# Patient Record
Sex: Female | Born: 1960 | Race: White | Hispanic: No | Marital: Married | Smoking: Never smoker
Health system: Southern US, Community
[De-identification: ages and names within clinical notes are randomized; demographics above are authoritative.]

## PROBLEM LIST (undated history)

## (undated) DIAGNOSIS — Z973 Presence of spectacles and contact lenses: Secondary | ICD-10-CM

## (undated) DIAGNOSIS — Z87898 Personal history of other specified conditions: Secondary | ICD-10-CM

## (undated) DIAGNOSIS — N289 Disorder of kidney and ureter, unspecified: Secondary | ICD-10-CM

## (undated) DIAGNOSIS — R42 Dizziness and giddiness: Secondary | ICD-10-CM

## (undated) DIAGNOSIS — F325 Major depressive disorder, single episode, in full remission: Secondary | ICD-10-CM

## (undated) DIAGNOSIS — Z87442 Personal history of urinary calculi: Secondary | ICD-10-CM

## (undated) DIAGNOSIS — K219 Gastro-esophageal reflux disease without esophagitis: Secondary | ICD-10-CM

## (undated) HISTORY — PX: TUBAL LIGATION: SHX77

## (undated) HISTORY — DX: Major depressive disorder, single episode, in full remission: F32.5

## (undated) HISTORY — PX: RIGHT OOPHORECTOMY: SHX2359

---

## 2004-11-07 ENCOUNTER — Ambulatory Visit: Payer: Self-pay | Admitting: Family Medicine

## 2008-04-20 ENCOUNTER — Ambulatory Visit: Payer: Self-pay | Admitting: Obstetrics & Gynecology

## 2008-04-26 ENCOUNTER — Ambulatory Visit: Payer: Self-pay | Admitting: Obstetrics & Gynecology

## 2008-07-07 ENCOUNTER — Encounter: Payer: Self-pay | Admitting: Family Medicine

## 2008-08-01 ENCOUNTER — Encounter: Payer: Self-pay | Admitting: Family Medicine

## 2009-06-03 HISTORY — PX: COLONOSCOPY: SHX174

## 2009-06-04 LAB — HM COLONOSCOPY: HM Colonoscopy: NORMAL

## 2009-09-01 ENCOUNTER — Encounter: Payer: Self-pay | Admitting: Family Medicine

## 2009-10-01 ENCOUNTER — Encounter: Payer: Self-pay | Admitting: Family Medicine

## 2010-01-16 ENCOUNTER — Ambulatory Visit: Payer: Self-pay | Admitting: Family Medicine

## 2010-01-29 ENCOUNTER — Ambulatory Visit: Payer: Self-pay | Admitting: Obstetrics & Gynecology

## 2010-01-30 ENCOUNTER — Ambulatory Visit: Payer: Self-pay | Admitting: Obstetrics & Gynecology

## 2010-02-02 LAB — PATHOLOGY REPORT

## 2011-06-04 HISTORY — PX: COLONOSCOPY: SHX174

## 2011-09-18 ENCOUNTER — Ambulatory Visit: Payer: Self-pay | Admitting: Gastroenterology

## 2011-09-18 LAB — HM COLONOSCOPY

## 2013-06-15 LAB — HM PAP SMEAR: HM PAP: NORMAL

## 2013-08-01 LAB — HM MAMMOGRAPHY: HM Mammogram: NORMAL

## 2013-08-18 ENCOUNTER — Ambulatory Visit: Payer: Self-pay | Admitting: Internal Medicine

## 2014-11-08 ENCOUNTER — Encounter: Payer: Self-pay | Admitting: Internal Medicine

## 2014-11-09 ENCOUNTER — Encounter: Payer: Self-pay | Admitting: Internal Medicine

## 2014-11-09 DIAGNOSIS — K219 Gastro-esophageal reflux disease without esophagitis: Secondary | ICD-10-CM

## 2014-11-09 DIAGNOSIS — F325 Major depressive disorder, single episode, in full remission: Secondary | ICD-10-CM | POA: Insufficient documentation

## 2014-11-09 DIAGNOSIS — M1990 Unspecified osteoarthritis, unspecified site: Secondary | ICD-10-CM | POA: Insufficient documentation

## 2014-11-09 DIAGNOSIS — N3946 Mixed incontinence: Secondary | ICD-10-CM

## 2014-12-16 ENCOUNTER — Encounter: Payer: Self-pay | Admitting: Internal Medicine

## 2014-12-16 ENCOUNTER — Other Ambulatory Visit: Payer: Self-pay | Admitting: Internal Medicine

## 2014-12-16 DIAGNOSIS — N3946 Mixed incontinence: Secondary | ICD-10-CM | POA: Insufficient documentation

## 2014-12-16 DIAGNOSIS — F325 Major depressive disorder, single episode, in full remission: Secondary | ICD-10-CM

## 2014-12-16 DIAGNOSIS — M1990 Unspecified osteoarthritis, unspecified site: Secondary | ICD-10-CM | POA: Insufficient documentation

## 2014-12-16 DIAGNOSIS — K219 Gastro-esophageal reflux disease without esophagitis: Secondary | ICD-10-CM | POA: Insufficient documentation

## 2014-12-16 DIAGNOSIS — M62830 Muscle spasm of back: Secondary | ICD-10-CM | POA: Insufficient documentation

## 2014-12-16 HISTORY — DX: Major depressive disorder, single episode, in full remission: F32.5

## 2014-12-16 MED ORDER — OXYBUTYNIN CHLORIDE ER 10 MG PO TB24: 10.0000 mg | ORAL_TABLET | Freq: Every day | ORAL | Status: DC

## 2015-01-16 ENCOUNTER — Other Ambulatory Visit: Payer: Self-pay | Admitting: Internal Medicine

## 2015-05-23 ENCOUNTER — Other Ambulatory Visit: Payer: Self-pay

## 2015-05-23 MED ORDER — NAPROXEN SODIUM 550 MG PO TABS: 550.0000 mg | ORAL_TABLET | Freq: Two times a day (BID) | ORAL | Status: DC

## 2015-06-01 NOTE — Telephone Encounter (Signed)
Pts coming in on 3/9 for her cpe

## 2015-08-07 ENCOUNTER — Encounter: Payer: Self-pay | Admitting: Internal Medicine

## 2015-08-07 ENCOUNTER — Ambulatory Visit (INDEPENDENT_AMBULATORY_CARE_PROVIDER_SITE_OTHER): Payer: BLUE CROSS/BLUE SHIELD | Admitting: Internal Medicine

## 2015-08-07 VITALS — BP 110/72 | HR 88 | Ht 65.5 in | Wt 186.6 lb

## 2015-08-07 DIAGNOSIS — N3000 Acute cystitis without hematuria: Secondary | ICD-10-CM

## 2015-08-07 LAB — POC URINALYSIS WITH MICROSCOPIC (NON AUTO)MANUAL RESULT
BILIRUBIN UA: NEGATIVE
GLUCOSE UA: NEGATIVE
KETONES UA: NEGATIVE
NITRITE UA: NEGATIVE
PH UA: 7.5
Spec Grav, UA: <=1.005
UROBILINOGEN UA: 0.2

## 2015-08-07 MED ORDER — PHENAZOPYRIDINE HCL 97.2 MG PO TABS: 97.0000 mg | ORAL_TABLET | Freq: Three times a day (TID) | ORAL | Status: DC | PRN

## 2015-08-07 MED ORDER — CIPROFLOXACIN HCL 250 MG PO TABS
250.0000 mg | ORAL_TABLET | Freq: Two times a day (BID) | ORAL | Status: DC
Start: 2015-08-07 — End: 2015-10-03

## 2015-08-07 NOTE — Progress Notes (Signed)
    Date:  08/07/2015   Name:  Madeline Moon   DOB:  1961/04/22   MRN:  [id number]   Chief Complaint: Urinary Tract Infection Urinary Tract Infection  This is a new problem. The problem occurs every urination. The quality of the pain is described as burning. The pain is mild. There has been no fever. The fever has been present for 3 - 4 days. Associated symptoms include frequency and urgency. Pertinent negatives include no chills or hematuria. She has tried antibiotics (old flagyl) for the symptoms.    Review of Systems  Constitutional: Negative for fever and chills.  Respiratory: Negative for chest tightness and shortness of breath.   Cardiovascular: Negative for chest pain and palpitations.  Genitourinary: Positive for dysuria, urgency, frequency and difficulty urinating. Negative for hematuria, menstrual problem and pelvic pain.  Neurological: Negative for dizziness and headaches.  Hematological: Negative for adenopathy. Does not bruise/bleed easily.  Psychiatric/Behavioral: Negative for agitation.    Patient Active Problem List   Diagnosis Date Noted  . Depression, major, single episode, complete remission (HCC) 12/16/2014  . Back muscle spasm 12/16/2014  . GERD (gastroesophageal reflux disease) 11/09/2014  . Mixed urge and stress incontinence 11/09/2014  . Osteoarthritis 11/09/2014    Prior to Admission medications   Medication Sig Start Date End Date Taking? Authorizing Provider  naproxen sodium (ANAPROX) 550 MG tablet Take 1 tablet (550 mg total) by mouth 2 (two) times daily with a meal. 05/23/15  Yes Reubin MilanLaura H Kerri-Anne Haeberle, MD  omeprazole (PRILOSEC) 20 MG capsule TAKE 1 CAPSULE DAILY 01/16/15  Yes Reubin MilanLaura H Donnita Farina, MD    No Known Allergies  Past Surgical History  Procedure Laterality Date  . Right oophorectomy    . Tubal ligation      Social History  Substance Use Topics  . Smoking status: Never Smoker   . Smokeless tobacco: None  . Alcohol Use: 1.2 oz/week    2  Standard drinks or equivalent per week     Medication list has been reviewed and updated.   Physical Exam  Constitutional: She appears well-developed and well-nourished.  Cardiovascular: Normal rate, regular rhythm and normal heart sounds.   Pulmonary/Chest: Effort normal and breath sounds normal. No respiratory distress.  Abdominal: Soft. Bowel sounds are normal. There is tenderness in the suprapubic area. There is no rebound, no guarding and no CVA tenderness.  Psychiatric: She has a normal mood and affect.  Nursing note and vitals reviewed.   BP 110/72 mmHg  Pulse 88  Ht 5' 5.5" (1.664 m)  Wt 186 lb 9.6 oz (84.641 kg)  BMI 30.57 kg/m2  Assessment and Plan: 1. Acute cystitis without hematuria Continue fluids - POC urinalysis w microscopic (non auto) - ciprofloxacin (CIPRO) 250 MG tablet; Take 1 tablet (250 mg total) by mouth 2 (two) times daily.  Dispense: 14 tablet; Refill: 0 - phenazopyridine (PYRIDIUM) 97 MG tablet; Take 1 tablet (97 mg total) by mouth 3 (three) times daily as needed for pain.  Dispense: 9 tablet; Refill: 0   Bari EdwardLaura Roi Jafari, MD [facility]  08/07/2015

## 2015-08-07 NOTE — Patient Instructions (Signed)

## 2015-08-10 ENCOUNTER — Ambulatory Visit (INDEPENDENT_AMBULATORY_CARE_PROVIDER_SITE_OTHER): Payer: BLUE CROSS/BLUE SHIELD | Admitting: Internal Medicine

## 2015-08-10 ENCOUNTER — Encounter: Payer: Self-pay | Admitting: Internal Medicine

## 2015-08-10 VITALS — BP 110/72 | HR 80 | Ht 65.5 in | Wt 188.8 lb

## 2015-08-10 DIAGNOSIS — N3000 Acute cystitis without hematuria: Secondary | ICD-10-CM | POA: Diagnosis not present

## 2015-08-10 DIAGNOSIS — F325 Major depressive disorder, single episode, in full remission: Secondary | ICD-10-CM

## 2015-08-10 DIAGNOSIS — K219 Gastro-esophageal reflux disease without esophagitis: Secondary | ICD-10-CM

## 2015-08-10 DIAGNOSIS — Z23 Encounter for immunization: Secondary | ICD-10-CM

## 2015-08-10 DIAGNOSIS — Z1239 Encounter for other screening for malignant neoplasm of breast: Secondary | ICD-10-CM

## 2015-08-10 DIAGNOSIS — Z0000 Encounter for general adult medical examination without abnormal findings: Secondary | ICD-10-CM | POA: Diagnosis not present

## 2015-08-10 LAB — POCT URINALYSIS DIPSTICK
BILIRUBIN UA: NEGATIVE
Glucose, UA: NEGATIVE
Ketones, UA: NEGATIVE
Leukocytes, UA: NEGATIVE
NITRITE UA: NEGATIVE
PH UA: 7.5
Protein, UA: NEGATIVE
Spec Grav, UA: 1.01
UROBILINOGEN UA: 0.2

## 2015-08-10 NOTE — Patient Instructions (Addendum)
Breast Self-Awareness Practicing breast self-awareness may pick up problems early, prevent significant medical complications, and possibly save your life. By practicing breast self-awareness, you can become familiar with how your breasts look and feel and if your breasts are changing. This allows you to notice changes early. It can also offer you some reassurance that your breast health is good. One way to learn what is normal for your breasts and whether your breasts are changing is to do a breast self-exam. If you find a lump or something that was not present in the past, it is best to contact your caregiver right away. Other findings that should be evaluated by your caregiver include nipple discharge, especially if it is bloody; skin changes or reddening; areas where the skin seems to be pulled in (retracted); or new lumps and bumps. Breast pain is seldom associated with cancer (malignancy), but should also be evaluated by a caregiver. HOW TO PERFORM A BREAST SELF-EXAM The best time to examine your breasts is 5-7 days after your menstrual period is over. During menstruation, the breasts are lumpier, and it may be more difficult to pick up changes. If you do not menstruate, have reached menopause, or had your uterus removed (hysterectomy), you should examine your breasts at regular intervals, such as monthly. If you are breastfeeding, examine your breasts after a feeding or after using a breast pump. Breast implants do not decrease the risk for lumps or tumors, so continue to perform breast self-exams as recommended. Talk to your caregiver about how to determine the difference between the implant and breast tissue. Also, talk about the amount of pressure you should use during the exam. Over time, you will become more familiar with the variations of your breasts and more comfortable with the exam. A breast self-exam requires you to remove all your clothes above the waist.  Look at your breasts and nipples.  Stand in front of a mirror in a room with good lighting. With your hands on your hips, push your hands firmly downward. Look for a difference in shape, contour, and size from one breast to the other (asymmetry). Asymmetry includes puckers, dips, or bumps. Also, look for skin changes, such as reddened or scaly areas on the breasts. Look for nipple changes, such as discharge, dimpling, repositioning, or redness.  Carefully feel your breasts. This is best done either in the shower or tub while using soapy water or when flat on your back. Place the arm (on the side of the breast you are examining) above your head. Use the pads (not the fingertips) of your three middle fingers on your opposite hand to feel your breasts. Start in the underarm area and use  inch (2 cm) overlapping circles to feel your breast. Use 3 different levels of pressure (light, medium, and firm pressure) at each circle before moving to the next circle. The light pressure is needed to feel the tissue closest to the skin. The medium pressure will help to feel breast tissue a little deeper, while the firm pressure is needed to feel the tissue close to the ribs. Continue the overlapping circles, moving downward over the breast until you feel your ribs below your breast. Then, move one finger-width towards the center of the body. Continue to use the  inch (2 cm) overlapping circles to feel your breast as you move slowly up toward the collar bone (clavicle) near the base of the neck. Continue the up and down exam using all 3 pressures until you reach the   middle of the chest. Do this with each breast, carefully feeling for lumps or changes.   Keep a written record with breast changes or normal findings for each breast. By writing this information down, you do not need to depend only on memory for size, tenderness, or location. Write down where you are in your menstrual cycle, if you are still menstruating. Breast tissue can have some lumps or thick  tissue. However, see your caregiver if you find anything that concerns you.  SEEK MEDICAL CARE IF:  You see a change in shape, contour, or size of your breasts or nipples.   You see skin changes, such as reddened or scaly areas on the breasts or nipples.   You have an unusual discharge from your nipples.   You feel a new lump or unusually thick areas.    This information is not intended to replace advice given to you by your health care provider. Make sure you discuss any questions you have with your health care provider.   Document Released: 05/20/2005 Document Revised: 05/06/2012 Document Reviewed: 09/04/2011 Elsevier Interactive Patient Education 2016 Elsevier Inc[employer] Tdap Vaccine (Tetanus, Diphtheria and Pertussis): What You Need to Know 1. Why get vaccinated? Tetanus, diphtheria and pertussis are very serious diseases. Tdap vaccine can protect us from these diseases. And, Tdap vaccine given to pregnant women can protect newborn babies against pertussis. TETANUS (Lockjaw) is rare in the [place] today. It causes painful muscle tightening and stiffness, usually all over the body.  It can lead to tightening of muscles in the head and neck so you can't open your mouth, swallow, or sometimes even breathe. Tetanus kills about 1 out of 10 people who are infected even after receiving the best medical care. DIPHTHERIA is also rare in the [place] today. It can cause a thick coating to form in the back of the throat.  It can lead to breathing problems, heart failure, paralysis, and death. PERTUSSIS (Whooping Cough) causes severe coughing spells, which can cause difficulty breathing, vomiting and disturbed sleep.  It can also lead to weight loss, incontinence, and rib fractures. Up to 2 in 100 adolescents and 5 in 100 adults with pertussis are hospitalized or have complications, which could include pneumonia or death. These diseases are caused by bacteria. Diphtheria and pertussis  are spread from person to person through secretions from coughing or sneezing. Tetanus enters the body through cuts, scratches, or wounds. Before vaccines, as many as 200,000 cases of diphtheria, 200,000 cases of pertussis, and hundreds of cases of tetanus, were reported in the [place] each year. Since vaccination began, reports of cases for tetanus and diphtheria have dropped by about 99% and for pertussis by about 80%. 2. Tdap vaccine Tdap vaccine can protect adolescents and adults from tetanus, diphtheria, and pertussis. One dose of Tdap is routinely given at age 11 or 12. People who did not get Tdap at that age should get it as soon as possible. Tdap is especially important for healthcare professionals and anyone having close contact with a baby younger than 12 months. Pregnant women should get a dose of Tdap during every pregnancy, to protect the newborn from pertussis. Infants are most at risk for severe, life-threatening complications from pertussis. Another vaccine, called Td, protects against tetanus and diphtheria, but not pertussis. A Td booster should be given every 10 years. Tdap may be given as one of these boosters if you have never gotten Tdap before. Tdap may also be given after a severe   cut or burn to prevent tetanus infection. Your doctor or the person giving you the vaccine can give you more information. Tdap may safely be given at the same time as other vaccines. 3. Some people should not get this vaccine  A person who has ever had a life-threatening allergic reaction after a previous dose of any diphtheria, tetanus or pertussis containing vaccine, OR has a severe allergy to any part of this vaccine, should not get Tdap vaccine. Tell the person giving the vaccine about any severe allergies.  Anyone who had coma or long repeated seizures within 7 days after a childhood dose of DTP or DTaP, or a previous dose of Tdap, should not get Tdap, unless a cause other than the vaccine  was found. They can still get Td.  Talk to your doctor if you:  have seizures or another nervous system problem,  had severe pain or swelling after any vaccine containing diphtheria, tetanus or pertussis,  ever had a condition called Guillain-Barr Syndrome (GBS),  aren't feeling well on the day the shot is scheduled. 4. Risks With any medicine, including vaccines, there is a chance of side effects. These are usually mild and go away on their own. Serious reactions are also possible but are rare. Most people who get Tdap vaccine do not have any problems with it. Mild problems following Tdap (Did not interfere with activities)  Pain where the shot was given (about 3 in 4 adolescents or 2 in 3 adults)  Redness or swelling where the shot was given (about 1 person in 5)  Mild fever of at least 100.4F (up to about 1 in 25 adolescents or 1 in 100 adults)  Headache (about 3 or 4 people in 10)  Tiredness (about 1 person in 3 or 4)  Nausea, vomiting, diarrhea, stomach ache (up to 1 in 4 adolescents or 1 in 10 adults)  Chills, sore joints (about 1 person in 10)  Body aches (about 1 person in 3 or 4)  Rash, swollen glands (uncommon) Moderate problems following Tdap (Interfered with activities, but did not require medical attention)  Pain where the shot was given (up to 1 in 5 or 6)  Redness or swelling where the shot was given (up to about 1 in 16 adolescents or 1 in 12 adults)  Fever over 102F (about 1 in 100 adolescents or 1 in 250 adults)  Headache (about 1 in 7 adolescents or 1 in 10 adults)  Nausea, vomiting, diarrhea, stomach ache (up to 1 or 3 people in 100)  Swelling of the entire arm where the shot was given (up to about 1 in 500). Severe problems following Tdap (Unable to perform usual activities; required medical attention)  Swelling, severe pain, bleeding and redness in the arm where the shot was given (rare). Problems that could happen after any  vaccine:  People sometimes faint after a medical procedure, including vaccination. Sitting or lying down for about 15 minutes can help prevent fainting, and injuries caused by a fall. Tell your doctor if you feel dizzy, or have vision changes or ringing in the ears.  Some people get severe pain in the shoulder and have difficulty moving the arm where a shot was given. This happens very rarely.  Any medication can cause a severe allergic reaction. Such reactions from a vaccine are very rare, estimated at fewer than 1 in a million doses, and would happen within a few minutes to a few hours after the vaccination. As with any medicine,   there is a very remote chance of a vaccine causing a serious injury or death. The safety of vaccines is always being monitored. For more information, visit: www.cdc.gov/vaccinesafety/ 5. What if there is a serious problem? What should I look for?  Look for anything that concerns you, such as signs of a severe allergic reaction, very high fever, or unusual behavior.  Signs of a severe allergic reaction can include hives, swelling of the face and throat, difficulty breathing, a fast heartbeat, dizziness, and weakness. These would usually start a few minutes to a few hours after the vaccination. What should I do?  If you think it is a severe allergic reaction or other emergency that can't wait, call 9-1-1 or get the person to the nearest hospital. Otherwise, call your doctor.  Afterward, the reaction should be reported to the Vaccine Adverse Event Reporting System (VAERS). Your doctor might file this report, or you can do it yourself through the VAERS web site at www.vaers.hhs.gov, or by calling 1-800-822-7967. VAERS does not give medical advice.  6. The National Vaccine Injury Compensation Program The National Vaccine Injury Compensation Program (VICP) is a federal program that was created to compensate people who may have been injured by certain vaccines. Persons who  believe they may have been injured by a vaccine can learn about the program and about filing a claim by calling 1-800-338-2382 or visiting the VICP website at www.hrsa.gov/vaccinecompensation. There is a time limit to file a claim for compensation. 7. How can I learn more?  Ask your doctor. He or she can give you the vaccine package insert or suggest other sources of information.  Call your local or state health department.  Contact the Centers for Disease Control and Prevention (CDC):  Call 1-800-232-4636 (1-800-CDC-INFO) or  Visit CDC's website at www.cdc.gov/vaccines CDC Tdap Vaccine VIS (07/27/13)   This information is not intended to replace advice given to you by your health care provider. Make sure you discuss any questions you have with your health care provider.   Document Released: 11/19/2011 Document Revised: 06/10/2014 Document Reviewed: 09/01/2013 Elsevier Interactive Patient Education 2016 Elsevier Inc.  

## 2015-08-10 NOTE — Progress Notes (Signed)
Date:  08/10/2015   Name:  Madeline Moon   DOB:  24-Apr-1961   MRN:  [id number]   Chief Complaint: Annual Exam Madeline Moon is a 55 y.o. female who presents today for her Complete Annual Exam. She feels well. She reports exercising regularly. She reports she is sleeping well. She is starting a diet plan - based on a book.  She used it in the past and lost 20 lbs. She is due for a mammogram - only wants to do it every 2 years.  She does not perform self exams.  UTI - was seen earlier this week with UTI.  Started on Cipro and pyridium. She is feeling much better.  GERD - controlled on medication.  No trouble swallowing, heartburn or change in bowel habits.  Review of Systems  Constitutional: Negative for fever, chills and fatigue.  HENT: Negative for hearing loss, tinnitus, trouble swallowing and voice change.   Eyes: Negative for visual disturbance.  Respiratory: Negative for cough, chest tightness, shortness of breath and wheezing.   Cardiovascular: Negative for chest pain, palpitations and leg swelling.  Gastrointestinal: Negative for abdominal pain, diarrhea, constipation and blood in stool.  Genitourinary: Negative for dysuria, hematuria and difficulty urinating.  Musculoskeletal: Negative for arthralgias.  Skin: Negative for color change and rash.  Neurological: Negative for dizziness, light-headedness and headaches.  Hematological: Negative for adenopathy. Does not bruise/bleed easily.  Psychiatric/Behavioral: Negative for sleep disturbance and dysphoric mood.    Patient Active Problem List   Diagnosis Date Noted  . Depression, major, single episode, complete remission (HCC) 12/16/2014  . Back muscle spasm 12/16/2014  . GERD (gastroesophageal reflux disease) 11/09/2014  . Mixed urge and stress incontinence 11/09/2014  . Osteoarthritis 11/09/2014    Prior to Admission medications   Medication Sig Start Date End Date Taking? Authorizing Provider  ciprofloxacin  (CIPRO) 250 MG tablet Take 1 tablet (250 mg total) by mouth 2 (two) times daily. 08/07/15  Yes Reubin MilanLaura H Kveon Casanas, MD  naproxen sodium (ANAPROX) 550 MG tablet Take 1 tablet (550 mg total) by mouth 2 (two) times daily with a meal. 05/23/15  Yes Reubin MilanLaura H Robecca Fulgham, MD  omeprazole (PRILOSEC) 20 MG capsule TAKE 1 CAPSULE DAILY 01/16/15  Yes Reubin MilanLaura H Javeion Cannedy, MD  phenazopyridine (PYRIDIUM) 97 MG tablet Take 1 tablet (97 mg total) by mouth 3 (three) times daily as needed for pain. 08/07/15  Yes Reubin MilanLaura H Shaundrea Carrigg, MD    No Known Allergies  Past Surgical History  Procedure Laterality Date  . Right oophorectomy    . Tubal ligation      Social History  Substance Use Topics  . Smoking status: Never Smoker   . Smokeless tobacco: None  . Alcohol Use: 1.2 oz/week    2 Standard drinks or equivalent per week     Medication list has been reviewed and updated.   Physical Exam  Constitutional: She is oriented to person, place, and time. She appears well-developed and well-nourished. No distress.  HENT:  Head: Normocephalic and atraumatic.  Right Ear: Tympanic membrane and ear canal normal.  Left Ear: Tympanic membrane and ear canal normal.  Nose: Right sinus exhibits no maxillary sinus tenderness. Left sinus exhibits no maxillary sinus tenderness.  Mouth/Throat: Uvula is midline and oropharynx is clear and moist.  Eyes: Conjunctivae and EOM are normal. Right eye exhibits no discharge. Left eye exhibits no discharge. No scleral icterus.  Neck: Normal range of motion. Carotid bruit is not present. No erythema present. No  thyromegaly present.  Cardiovascular: Normal rate, regular rhythm, normal heart sounds and normal pulses.   Pulmonary/Chest: Effort normal. No respiratory distress. She has no wheezes. Right breast exhibits no mass, no nipple discharge, no skin change and no tenderness. Left breast exhibits no mass, no nipple discharge, no skin change and no tenderness.  Abdominal: Soft. Bowel sounds are  normal. There is no hepatosplenomegaly. There is no tenderness. There is no CVA tenderness.  Musculoskeletal: Normal range of motion.  Lymphadenopathy:    She has no cervical adenopathy.    She has no axillary adenopathy.  Neurological: She is alert and oriented to person, place, and time. She has normal reflexes. No cranial nerve deficit or sensory deficit.  Skin: Skin is warm, dry and intact. No rash noted.  Psychiatric: She has a normal mood and affect. Her speech is normal and behavior is normal. Thought content normal.  Nursing note and vitals reviewed.   BP 110/72 mmHg  Pulse 80  Ht 5' 5.5" (1.664 m)  Wt 188 lb 12.8 oz (85.639 kg)  BMI 30.93 kg/m2  Assessment and Plan: 1. Annual physical exam Normal exam except for weight - Comprehensive metabolic panel - Lipid panel - TSH  2. Gastroesophageal reflux disease, esophagitis presence not specified controlled - CBC with Differential/Platelet  3. Depression, major, single episode, complete remission (HCC) Doing well off of medication  4. Acute cystitis without hematuria improved - POCT urinalysis dipstick  5. Breast cancer screening - MM DIGITAL SCREENING BILATERAL; Future  6. Need for diphtheria-tetanus-pertussis (Tdap) vaccine - Tdap vaccine greater than or equal to 7yo IM  Bari Edward, MD [facility]  08/10/2015

## 2015-08-12 LAB — COMPREHENSIVE METABOLIC PANEL
A/G RATIO: 1.6 (ref 1.1–2.5)
ALBUMIN: 4.1 g/dL (ref 3.5–5.5)
ALT: 19 IU/L (ref 0–32)
AST: 18 IU/L (ref 0–40)
Alkaline Phosphatase: 98 IU/L (ref 39–117)
BILIRUBIN TOTAL: 0.6 mg/dL (ref 0.0–1.2)
BUN/Creatinine Ratio: 18 (ref 9–23)
BUN: 13 mg/dL (ref 6–24)
CALCIUM: 9.2 mg/dL (ref 8.7–10.2)
CHLORIDE: 102 mmol/L (ref 96–106)
CO2: 24 mmol/L (ref 18–29)
Creatinine, Ser: 0.72 mg/dL (ref 0.57–1.00)
GFR calc non Af Amer: 95 mL/min/{1.73_m2} (ref >59)
GFR, EST AFRICAN AMERICAN: 109 mL/min/{1.73_m2} (ref >59)
GLOBULIN, TOTAL: 2.5 g/dL (ref 1.5–4.5)
Glucose: 104 mg/dL — ABNORMAL HIGH (ref 65–99)
POTASSIUM: 5 mmol/L (ref 3.5–5.2)
Sodium: 141 mmol/L (ref 134–144)
Total Protein: 6.6 g/dL (ref 6.0–8.5)

## 2015-08-12 LAB — CBC WITH DIFFERENTIAL/PLATELET
BASOS: 1 %
Basophils Absolute: 0 10*3/uL (ref 0.0–0.2)
EOS (ABSOLUTE): 0.2 10*3/uL (ref 0.0–0.4)
EOS: 5 %
HEMATOCRIT: 44.5 % (ref 34.0–46.6)
HEMOGLOBIN: 14 g/dL (ref 11.1–15.9)
Immature Grans (Abs): 0 10*3/uL (ref 0.0–0.1)
Immature Granulocytes: 0 %
LYMPHS ABS: 1.4 10*3/uL (ref 0.7–3.1)
Lymphs: 32 %
MCH: 27.4 pg (ref 26.6–33.0)
MCHC: 31.5 g/dL (ref 31.5–35.7)
MCV: 87 fL (ref 79–97)
MONOCYTES: 8 %
Monocytes Absolute: 0.3 10*3/uL (ref 0.1–0.9)
Neutrophils Absolute: 2.4 10*3/uL (ref 1.4–7.0)
Neutrophils: 54 %
Platelets: 265 10*3/uL (ref 150–379)
RBC: 5.11 x10E6/uL (ref 3.77–5.28)
RDW: 15.6 % — ABNORMAL HIGH (ref 12.3–15.4)
WBC: 4.4 10*3/uL (ref 3.4–10.8)

## 2015-08-12 LAB — HEMOGLOBIN A1C
ESTIMATED AVERAGE GLUCOSE: 126 mg/dL
HEMOGLOBIN A1C: 6 % — AB (ref 4.8–5.6)

## 2015-08-12 LAB — LIPID PANEL
Chol/HDL Ratio: 4.3 ratio units (ref 0.0–4.4)
Cholesterol, Total: 237 mg/dL — ABNORMAL HIGH (ref 100–199)
HDL: 55 mg/dL (ref >39)
LDL Calculated: 157 mg/dL — ABNORMAL HIGH (ref 0–99)
Triglycerides: 127 mg/dL (ref 0–149)
VLDL Cholesterol Cal: 25 mg/dL (ref 5–40)

## 2015-08-12 LAB — TSH: TSH: 1.51 u[IU]/mL (ref 0.450–4.500)

## 2015-08-14 ENCOUNTER — Telehealth: Payer: Self-pay

## 2015-08-14 NOTE — Telephone Encounter (Signed)
-----   Message from Reubin MilanLaura H Berglund, MD sent at 08/12/2015  3:50 PM EST ----- Labs are all normal.  A1C is 6 - pre-diabetic range but should improve with weight loss.

## 2015-08-14 NOTE — Telephone Encounter (Signed)
Spoke with patient. Patient advised of all results and verbalized understanding. Will call back with any future questions or concerns. MAH  

## 2015-08-14 NOTE — Telephone Encounter (Signed)
-----   Message from Laura H Berglund, MD sent at 08/12/2015  3:50 PM EST ----- Labs are all normal.  A1C is 6 - pre-diabetic range but should improve with weight loss. 

## 2015-10-03 ENCOUNTER — Ambulatory Visit
Admission: RE | Admit: 2015-10-03 | Discharge: 2015-10-03 | Disposition: A | Discharge status: Home / Self Care | Payer: BLUE CROSS/BLUE SHIELD | Source: Ambulatory Visit | Attending: Internal Medicine | Admitting: Internal Medicine

## 2015-10-03 ENCOUNTER — Encounter: Payer: Self-pay | Admitting: Internal Medicine

## 2015-10-03 ENCOUNTER — Ambulatory Visit (INDEPENDENT_AMBULATORY_CARE_PROVIDER_SITE_OTHER): Payer: BLUE CROSS/BLUE SHIELD | Admitting: Internal Medicine

## 2015-10-03 VITALS — BP 115/78 | HR 71 | Temp 98.6°F | Resp 16 | Ht 65.5 in | Wt 195.0 lb

## 2015-10-03 DIAGNOSIS — R0781 Pleurodynia: Secondary | ICD-10-CM | POA: Diagnosis not present

## 2015-10-03 NOTE — Progress Notes (Signed)
    Date:  10/03/2015   Name:  Madeline MourningJudith A Moon   DOB:  [birth year]   MRN:  [id number]   Chief Complaint: Chest Pain Chest Pain  This is a new problem. The current episode started more than 1 month ago. The problem occurs intermittently. The pain is present in the lateral region. The quality of the pain is described as sharp. The pain does not radiate. Pertinent negatives include no abdominal pain, cough, fever, palpitations or shortness of breath. She has tried nothing for the symptoms.  She lived with smokers her whole life.  She has had several bouts of bronchitis as an adult.  At one time she was using an inhaler for asthma.  She also had LPR causing some symptoms.  These improved with improved diet and she stopped the PPI.  However, gerd is recurring more due to liberalizing her diet.   Review of Systems  Constitutional: Negative for fever, chills and fatigue.  HENT: Negative for congestion.   Respiratory: Negative for cough, chest tightness, shortness of breath and wheezing.   Cardiovascular: Positive for chest pain. Negative for palpitations and leg swelling.  Gastrointestinal: Negative for abdominal pain.    Patient Active Problem List   Diagnosis Date Noted  . Depression, major, single episode, complete remission (HCC) 12/16/2014  . Back muscle spasm 12/16/2014  . GERD (gastroesophageal reflux disease) 11/09/2014  . Mixed urge and stress incontinence 11/09/2014  . Osteoarthritis 11/09/2014    Prior to Admission medications   Medication Sig Start Date End Date Taking? Authorizing Provider  omeprazole (PRILOSEC) 20 MG capsule TAKE 1 CAPSULE DAILY 01/16/15  Yes Reubin MilanLaura H Leshon Armistead, MD    No Known Allergies  Past Surgical History  Procedure Laterality Date  . Right oophorectomy    . Tubal ligation      Social History  Substance Use Topics  . Smoking status: Never Smoker   . Smokeless tobacco: None  . Alcohol Use: 1.2 oz/week    2 Standard drinks or equivalent per week      Medication list has been reviewed and updated.   Physical Exam  Constitutional: She is oriented to person, place, and time. She appears well-developed. No distress.  HENT:  Head: Normocephalic and atraumatic.  Neck: Normal range of motion. No thyromegaly present.  Cardiovascular: Normal rate, regular rhythm and normal heart sounds.   Pulmonary/Chest: Effort normal and breath sounds normal. No respiratory distress. She has no wheezes. She has no rales. She exhibits no tenderness.  Musculoskeletal: Normal range of motion.  Neurological: She is alert and oriented to person, place, and time.  Skin: Skin is warm and dry. No rash noted.  Psychiatric: She has a normal mood and affect. Her behavior is normal. Thought content normal.  Nursing note and vitals reviewed.   BP 115/78 mmHg  Pulse 71  Temp(Src) 98.6 F (37 C) (Oral)  Resp 16  Ht 5' 5.5" (1.664 m)  Wt 195 lb (88.451 kg)  BMI 31.94 kg/m2  SpO2 100%  Assessment and Plan: 1. Pleuritic chest pain Resume daily PPI Begin asthma therapy with Advair or similar Follow up with CXR results - DG Chest 2 View; Future   Bari EdwardLaura Kern Gingras, MD [facility]  10/03/2015

## 2016-04-05 ENCOUNTER — Encounter: Payer: Self-pay | Admitting: Internal Medicine

## 2016-04-05 ENCOUNTER — Ambulatory Visit (INDEPENDENT_AMBULATORY_CARE_PROVIDER_SITE_OTHER): Payer: BLUE CROSS/BLUE SHIELD | Admitting: Internal Medicine

## 2016-04-05 VITALS — BP 108/78 | HR 73 | Resp 16 | Ht 65.5 in | Wt 197.0 lb

## 2016-04-05 DIAGNOSIS — B80 Enterobiasis: Secondary | ICD-10-CM | POA: Diagnosis not present

## 2016-04-05 DIAGNOSIS — Z23 Encounter for immunization: Secondary | ICD-10-CM

## 2016-04-05 MED ORDER — ALBENDAZOLE 200 MG PO TABS: 400.0000 mg | ORAL_TABLET | Freq: Once | ORAL | 0 refills | Status: DC

## 2016-04-05 NOTE — Progress Notes (Signed)
    Date:  04/05/2016   Name:  Madeline Moon   DOB:  [birth year]   MRN:  [id number]   Chief Complaint: Anal Itching (Night time irritation in middle of night 3 weeks now and tried Reeses Pinworm OTC but did not work. ) She has itching/tickling at night mostly.  Not severe.  No bleeding or diarrhea.  No weight loss. She tried to use gorilla tape to catch some pinworms but was not successful.  She has seen some dark 'grit' in her stool thinking these were parasite eggs. She has taken one dose of over the counter pinworm treatment.  Review of Systems  Constitutional: Negative for chills, fever and unexpected weight change.  Respiratory: Negative for chest tightness and shortness of breath.   Cardiovascular: Negative for chest pain.  Gastrointestinal: Positive for rectal pain (itching). Negative for abdominal pain, blood in stool, constipation and diarrhea.    Patient Active Problem List   Diagnosis Date Noted  . Depression, major, single episode, complete remission (HCC) 12/16/2014  . Back muscle spasm 12/16/2014  . GERD (gastroesophageal reflux disease) 11/09/2014  . Mixed urge and stress incontinence 11/09/2014  . Osteoarthritis 11/09/2014    Prior to Admission medications   Medication Sig Start Date End Date Taking? Authorizing Provider  omeprazole (PRILOSEC) 20 MG capsule TAKE 1 CAPSULE DAILY 01/16/15  Yes Reubin MilanLaura H Mikaila Grunert, MD  Pyrantel Pamoate (REESES PINWORM MEDICINE PO) Take by mouth.   Yes Historical Provider, MD    No Known Allergies  Past Surgical History:  Procedure Laterality Date  . RIGHT OOPHORECTOMY    . TUBAL LIGATION      Social History  Substance Use Topics  . Smoking status: Never Smoker  . Smokeless tobacco: Never Used  . Alcohol use 1.2 oz/week    2 Standard drinks or equivalent per week     Medication list has been reviewed and updated.   Physical Exam  Constitutional: She is oriented to person, place, and time. She appears well-developed. No  distress.  HENT:  Head: Normocephalic and atraumatic.  Cardiovascular: Normal rate, regular rhythm and normal heart sounds.   Pulmonary/Chest: Effort normal and breath sounds normal. No respiratory distress.  Musculoskeletal: Normal range of motion.  Neurological: She is alert and oriented to person, place, and time.  Skin: Skin is warm and dry. No rash noted.  Psychiatric: She has a normal mood and affect. Her behavior is normal. Thought content normal.  Nursing note and vitals reviewed.   BP 108/78   Pulse 73   Resp 16   Ht 5' 5.5" (1.664 m)   Wt 197 lb (89.4 kg)   SpO2 99%   BMI 32.28 kg/m   Assessment and Plan: 1. 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 Repeat in one week  Dispense: 4 tablet; Refill: 0  2. Need for influenza vaccination - Flu Vaccine QUAD 36+ mos IM   Bari EdwardLaura Aleria Maheu, MD [facility]  04/05/2016

## 2016-05-03 ENCOUNTER — Other Ambulatory Visit: Payer: Self-pay | Admitting: Internal Medicine

## 2016-05-03 DIAGNOSIS — B80 Enterobiasis: Secondary | ICD-10-CM

## 2016-05-03 MED ORDER — ALBENDAZOLE 200 MG PO TABS: 400.0000 mg | ORAL_TABLET | Freq: Once | ORAL | 0 refills | Status: AC

## 2016-05-07 ENCOUNTER — Telehealth: Payer: Self-pay

## 2016-05-07 NOTE — Telephone Encounter (Signed)
Called for refill on pin worm meds and advised her to bring us sample per Dr. Judithann GravesBerglund.

## 2016-05-14 ENCOUNTER — Encounter: Payer: Self-pay | Admitting: Internal Medicine

## 2016-05-14 ENCOUNTER — Ambulatory Visit (INDEPENDENT_AMBULATORY_CARE_PROVIDER_SITE_OTHER): Payer: BLUE CROSS/BLUE SHIELD | Admitting: Internal Medicine

## 2016-05-14 VITALS — BP 122/86 | HR 67 | Temp 97.6°F | Wt 197.0 lb

## 2016-05-14 DIAGNOSIS — L290 Pruritus ani: Secondary | ICD-10-CM

## 2016-05-14 MED ORDER — HYDROCORTISONE 2.5 % RE CREA: 1.0000 "application " | TOPICAL_CREAM | Freq: Three times a day (TID) | RECTAL | 0 refills | Status: DC

## 2016-05-14 NOTE — Progress Notes (Signed)
    Date:  05/14/2016   Name:  Madeline MourningJudith A Pepitone   DOB:  1960-07-20   MRN:  [id number]   Chief Complaint: No chief complaint on file. Patient seen recently with complaint of pin worms - rectal itching at night and during the day.  She did not see any thing but wanted empiric treatment.  She was given one round of albenazole which she said did not work.  She was given a second single dose.  She was instructed to return if sx were persistent. She still has itching at random times and has tried to use tape but has not captured a worm.  I declined to give her more medication without a definitive dx.     Review of Systems  Constitutional: Negative for appetite change, fatigue and fever.  Respiratory: Negative for cough, chest tightness and shortness of breath.   Cardiovascular: Negative for chest pain, palpitations and leg swelling.  Gastrointestinal: Negative for abdominal pain, blood in stool, diarrhea and rectal pain.       Rectal itching   Genitourinary: Negative for dysuria and hematuria.  Musculoskeletal: Negative for arthralgias.  Skin: Negative for rash.  Neurological: Negative for tremors, numbness and headaches.  Psychiatric/Behavioral: Negative for dysphoric mood.    Patient Active Problem List   Diagnosis Date Noted  . Depression, major, single episode, complete remission (HCC) 12/16/2014  . Back muscle spasm 12/16/2014  . GERD (gastroesophageal reflux disease) 11/09/2014  . Mixed urge and stress incontinence 11/09/2014  . Osteoarthritis 11/09/2014    Prior to Admission medications   Medication Sig Start Date End Date Taking? Authorizing Provider  omeprazole (PRILOSEC) 20 MG capsule TAKE 1 CAPSULE DAILY 01/16/15  Yes Reubin MilanLaura H Onnika Siebel, MD    No Known Allergies  Past Surgical History:  Procedure Laterality Date  . RIGHT OOPHORECTOMY    . TUBAL LIGATION      Social History  Substance Use Topics  . Smoking status: Never Smoker  . Smokeless tobacco: Never Used  .  Alcohol use 1.2 oz/week    2 Standard drinks or equivalent per week     Medication list has been reviewed and updated.   Physical Exam  Constitutional: She is oriented to person, place, and time. She appears well-developed. No distress.  HENT:  Head: Normocephalic and atraumatic.  Cardiovascular: Normal rate, regular rhythm and normal heart sounds.   Pulmonary/Chest: Effort normal and breath sounds normal. No respiratory distress.  Abdominal:  Patient declines rectal exam  Musculoskeletal: Normal range of motion.  Neurological: She is alert and oriented to person, place, and time.  Skin: Skin is warm and dry. No rash noted.  Psychiatric: She has a normal mood and affect. Her behavior is normal. Thought content normal.  Nursing note and vitals reviewed.   BP 122/86   Pulse 67   Temp 97.6 F (36.4 C)   Wt 197 lb (89.4 kg)   SpO2 99%   BMI 32.28 kg/m   Assessment and Plan: 1. Rectal itching Unlikely to be worms Return if no improvement with anusol - hydrocortisone (ANUSOL-HC) 2.5 % rectal cream; Place 1 application rectally 3 (three) times daily.  Dispense: 30 g; Refill: 0   Bari EdwardLaura Keaja Reaume, MD [facility]  05/14/2016

## 2016-05-23 ENCOUNTER — Other Ambulatory Visit: Payer: BLUE CROSS/BLUE SHIELD

## 2016-05-23 ENCOUNTER — Ambulatory Visit (INDEPENDENT_AMBULATORY_CARE_PROVIDER_SITE_OTHER): Payer: BLUE CROSS/BLUE SHIELD | Admitting: Internal Medicine

## 2016-05-23 ENCOUNTER — Encounter: Payer: Self-pay | Admitting: Internal Medicine

## 2016-05-23 VITALS — BP 132/88 | HR 86 | Temp 98.2°F | Wt 198.0 lb

## 2016-05-23 DIAGNOSIS — B354 Tinea corporis: Secondary | ICD-10-CM

## 2016-05-23 MED ORDER — FLUCONAZOLE 100 MG PO TABS: 100.0000 mg | ORAL_TABLET | Freq: Every day | ORAL | 0 refills | Status: DC

## 2016-05-23 NOTE — Progress Notes (Signed)
Date:  05/23/2016   Name:  Madeline MourningJudith A Hurd   DOB:  [birth year]   MRN:  [id number]   Chief Complaint: No chief complaint on file. HPI Rectal itching - patient was seen initially several weeks ago complaining of rectal itching which she thought was coming from pinworms. The itching was primarily nocturnal however the patient was unable to actually obtain any worms. She was treated with 2 courses of anti-helminthic medication with only transient improvement. She was then seen with what was felt to be possibly hemorrhoids but a rectal exam was not performed per patient request. She has used hydrocortisone rectally for several days with transient improvement but now is itching again.   Review of Systems  Constitutional: Negative for chills and fever.  Respiratory: Negative for chest tightness and shortness of breath.   Cardiovascular: Negative for chest pain.  Gastrointestinal: Negative for blood in stool, constipation and rectal pain.       Rectal itching  Genitourinary: Negative for dysuria and hematuria.    Patient Active Problem List   Diagnosis Date Noted  . Depression, major, single episode, complete remission (HCC) 12/16/2014  . Back muscle spasm 12/16/2014  . GERD (gastroesophageal reflux disease) 11/09/2014  . Mixed urge and stress incontinence 11/09/2014  . Osteoarthritis 11/09/2014    Prior to Admission medications   Medication Sig Start Date End Date Taking? Authorizing Provider  fluconazole (DIFLUCAN) 100 MG tablet Take 1 tablet (100 mg total) by mouth daily. 05/23/16   Reubin MilanLaura H Holston Oyama, MD  hydrocortisone (ANUSOL-HC) 2.5 % rectal cream Place 1 application rectally 3 (three) times daily. 05/14/16   Reubin MilanLaura H Drucilla Cumber, MD  omeprazole (PRILOSEC) 20 MG capsule TAKE 1 CAPSULE DAILY 01/16/15   Reubin MilanLaura H Marne Meline, MD    No Known Allergies  Past Surgical History:  Procedure Laterality Date  . RIGHT OOPHORECTOMY    . TUBAL LIGATION      Social History  Substance Use  Topics  . Smoking status: Never Smoker  . Smokeless tobacco: Never Used  . Alcohol use 1.2 oz/week    2 Standard drinks or equivalent per week     Medication list has been reviewed and updated.   Physical Exam  Constitutional: She is oriented to person, place, and time. She appears well-developed. No distress.  HENT:  Head: Normocephalic and atraumatic.  Neck: Normal range of motion. Neck supple. No thyromegaly present.  Cardiovascular: Normal rate, regular rhythm and normal heart sounds.   Pulmonary/Chest: Effort normal and breath sounds normal. No respiratory distress.  Abdominal: Soft. Bowel sounds are normal.  Genitourinary: Rectal exam shows no external hemorrhoid (small tag at 12 oclock), no internal hemorrhoid, no mass and no tenderness.  Musculoskeletal: Normal range of motion.  Neurological: She is alert and oriented to person, place, and time.  Skin: Skin is warm and dry. No rash noted.  Skin around the rectum is slightly reddened c/w tinea  Psychiatric: She has a normal mood and affect. Her behavior is normal. Thought content normal.  Nursing note and vitals reviewed.   BP 132/88   Pulse 86   Temp 98.2 F (36.8 C)   Wt 198 lb (89.8 kg)   SpO2 97%   BMI 32.45 kg/m   Assessment and Plan: 1. Tinea corporis Stop anusol HC cream Avoid excessive moisture and heat - fluconazole (DIFLUCAN) 100 MG tablet; Take 1 tablet (100 mg total) by mouth daily.  Dispense: 3 tablet; Refill: 0   Bari EdwardLaura Keaunna Skipper, MD [facility]  05/23/2016

## 2016-08-12 ENCOUNTER — Encounter: Payer: Self-pay | Admitting: Internal Medicine

## 2016-08-12 ENCOUNTER — Ambulatory Visit (INDEPENDENT_AMBULATORY_CARE_PROVIDER_SITE_OTHER): Payer: BLUE CROSS/BLUE SHIELD | Admitting: Internal Medicine

## 2016-08-12 VITALS — BP 106/82 | HR 62 | Ht 65.0 in | Wt 192.6 lb

## 2016-08-12 DIAGNOSIS — K219 Gastro-esophageal reflux disease without esophagitis: Secondary | ICD-10-CM | POA: Diagnosis not present

## 2016-08-12 DIAGNOSIS — Z1231 Encounter for screening mammogram for malignant neoplasm of breast: Secondary | ICD-10-CM

## 2016-08-12 DIAGNOSIS — Z0000 Encounter for general adult medical examination without abnormal findings: Secondary | ICD-10-CM | POA: Diagnosis not present

## 2016-08-12 DIAGNOSIS — Z1239 Encounter for other screening for malignant neoplasm of breast: Secondary | ICD-10-CM

## 2016-08-12 DIAGNOSIS — E785 Hyperlipidemia, unspecified: Secondary | ICD-10-CM | POA: Insufficient documentation

## 2016-08-12 LAB — POCT URINALYSIS DIPSTICK
Bilirubin, UA: NEGATIVE
Glucose, UA: NEGATIVE
Ketones, UA: NEGATIVE
Leukocytes, UA: NEGATIVE
NITRITE UA: NEGATIVE
PROTEIN UA: NEGATIVE
Spec Grav, UA: 1.005
UROBILINOGEN UA: 0.2
pH, UA: 7

## 2016-08-12 NOTE — Progress Notes (Signed)
Date:  08/12/2016   Name:  Madeline Moon   DOB:  December 10, 1960   MRN:  [id number]   Chief Complaint: Annual Exam (no pap.) AYLENE Moon is a 56 y.o. female who presents today for her Complete Annual Exam. She feels well. She reports exercising walking. She reports she is sleeping well. She denies breast complaints. She is due for mammogram. Gastroesophageal Reflux  She reports no abdominal pain, no chest pain, no coughing or no wheezing. This is a chronic problem. The problem occurs occasionally. The problem has been gradually improving. Pertinent negatives include no fatigue. She has tried a PPI for the symptoms. The treatment provided significant relief.     Review of Systems  Constitutional: Negative for chills, fatigue and fever.  HENT: Negative for congestion, hearing loss, tinnitus, trouble swallowing and voice change.   Eyes: Negative for visual disturbance.  Respiratory: Negative for cough, chest tightness, shortness of breath and wheezing.   Cardiovascular: Negative for chest pain, palpitations and leg swelling.  Gastrointestinal: Negative for abdominal pain, constipation, diarrhea and vomiting.  Endocrine: Negative for polydipsia and polyuria.  Genitourinary: Negative for dysuria, frequency, genital sores, vaginal bleeding and vaginal discharge.  Musculoskeletal: Negative for arthralgias, gait problem and joint swelling.  Skin: Negative for color change and rash.  Allergic/Immunologic: Negative for environmental allergies.  Neurological: Negative for dizziness, tremors, light-headedness and headaches.  Hematological: Negative for adenopathy. Does not bruise/bleed easily.  Psychiatric/Behavioral: Negative for dysphoric mood and sleep disturbance. The patient is not nervous/anxious.     Patient Active Problem List   Diagnosis Date Noted  . Hyperlipidemia, mild 08/12/2016  . Depression, major, single episode, complete remission (HCC) 12/16/2014  . Back muscle spasm  12/16/2014  . GERD (gastroesophageal reflux disease) 11/09/2014  . Mixed urge and stress incontinence 11/09/2014  . Osteoarthritis 11/09/2014    Prior to Admission medications   Medication Sig Start Date End Date Taking? Authorizing Provider  omeprazole (PRILOSEC) 20 MG capsule TAKE 1 CAPSULE DAILY 01/16/15  Yes Reubin Milan, MD    No Known Allergies  Past Surgical History:  Procedure Laterality Date  . RIGHT OOPHORECTOMY    . TUBAL LIGATION      Social History  Substance Use Topics  . Smoking status: Never Smoker  . Smokeless tobacco: Never Used  . Alcohol use 1.2 oz/week    2 Standard drinks or equivalent per week     Medication list has been reviewed and updated.   Physical Exam  Constitutional: She is oriented to person, place, and time. She appears well-developed and well-nourished. No distress.  HENT:  Head: Normocephalic and atraumatic.  Right Ear: Tympanic membrane and ear canal normal.  Left Ear: Tympanic membrane and ear canal normal.  Nose: Right sinus exhibits no maxillary sinus tenderness. Left sinus exhibits no maxillary sinus tenderness.  Mouth/Throat: Uvula is midline and oropharynx is clear and moist.  Eyes: Conjunctivae and EOM are normal. Right eye exhibits no discharge. Left eye exhibits no discharge. No scleral icterus.  Neck: Normal range of motion. Carotid bruit is not present. No erythema present. No thyromegaly present.  Cardiovascular: Normal rate, regular rhythm, normal heart sounds and normal pulses.   Pulmonary/Chest: Effort normal. No respiratory distress. She has no wheezes. Right breast exhibits no mass, no nipple discharge, no skin change and no tenderness. Left breast exhibits no mass, no nipple discharge, no skin change and no tenderness.  Abdominal: Soft. Bowel sounds are normal. There is no hepatosplenomegaly. There is no  tenderness. There is no CVA tenderness.  Musculoskeletal: Normal range of motion.  Lymphadenopathy:    She has  no cervical adenopathy.    She has no axillary adenopathy.  Neurological: She is alert and oriented to person, place, and time. She has normal reflexes. No cranial nerve deficit or sensory deficit.  Skin: Skin is warm, dry and intact. No rash noted.  Psychiatric: She has a normal mood and affect. Her speech is normal and behavior is normal. Thought content normal.  Nursing note and vitals reviewed.   BP 106/82   Pulse 62   Ht 5\' 5"  (1.651 m)   Wt 192 lb 9.6 oz (87.4 kg)   SpO2 99%   BMI 32.05 kg/m   Assessment and Plan: 1. Annual physical exam Resume regular exercise and healthy diet - Comprehensive metabolic panel - TSH - POCT urinalysis dipstick - Hemoglobin A1c  2. Breast cancer screening Schedule mammogram  3. Gastroesophageal reflux disease, esophagitis presence not specified Stable on PPI - CBC with Differential/Platelet  4. Hyperlipidemia, mild - Lipid panel   No orders of the defined types were placed in this encounter.   Bari EdwardLaura Kartik Fernando, MD [facility]  08/12/2016

## 2016-08-12 NOTE — Patient Instructions (Signed)
Breast Self-Awareness Breast self-awareness means being familiar with how your breasts look and feel. It involves checking your breasts regularly and reporting any changes to your health care provider. Practicing breast self-awareness is important. A change in your breasts can be a sign of a serious medical problem. Being familiar with how your breasts look and feel allows you to find any problems early, when treatment is more likely to be successful. All women should practice breast self-awareness, including women who have had breast implants. How to do a breast self-exam One way to learn what is normal for your breasts and whether your breasts are changing is to do a breast self-exam. To do a breast self-exam: Look for Changes   1. Remove all the clothing above your waist. 2. Stand in front of a mirror in a room with good lighting. 3. Put your hands on your hips. 4. Push your hands firmly downward. 5. Compare your breasts in the mirror. Look for differences between them (asymmetry), such as:  Differences in shape.  Differences in size.  Puckers, dips, and bumps in one breast and not the other. 6. Look at each breast for changes in your skin, such as:  Redness.  Scaly areas. 7. Look for changes in your nipples, such as:  Discharge.  Bleeding.  Dimpling.  Redness.  A change in position. Feel for Changes   Carefully feel your breasts for lumps and changes. It is best to do this while lying on your back on the floor and again while sitting or standing in the shower or tub with soapy water on your skin. Feel each breast in the following way:  Place the arm on the side of the breast you are examining above your head.  Feel your breast with the other hand.  Start in the nipple area and make  inch (2 cm) overlapping circles to feel your breast. Use the pads of your three middle fingers to do this. Apply light pressure, then medium pressure, then firm pressure. The light pressure  will allow you to feel the tissue closest to the skin. The medium pressure will allow you to feel the tissue that is a little deeper. The firm pressure will allow you to feel the tissue close to the ribs.  Continue the overlapping circles, moving downward over the breast until you feel your ribs below your breast.  Move one finger-width toward the center of the body. Continue to use the  inch (2 cm) overlapping circles to feel your breast as you move slowly up toward your collarbone.  Continue the up and down exam using all three pressures until you reach your armpit. Write Down What You Find   Write down what is normal for each breast and any changes that you find. Keep a written record with breast changes or normal findings for each breast. By writing this information down, you do not need to depend only on memory for size, tenderness, or location. Write down where you are in your menstrual cycle, if you are still menstruating. If you are having trouble noticing differences in your breasts, do not get discouraged. With time you will become more familiar with the variations in your breasts and more comfortable with the exam. How often should I examine my breasts? Examine your breasts every month. If you are breastfeeding, the best time to examine your breasts is after a feeding or after using a breast pump. If you menstruate, the best time to examine your breasts is 5-7 days   after your period is over. During your period, your breasts are lumpier, and it may be more difficult to notice changes. When should I see my health care provider? See your health care provider if you notice:  A change in shape or size of your breasts or nipples.  A change in the skin of your breast or nipples, such as a reddened or scaly area.  Unusual discharge from your nipples.  A lump or thick area that was not there before.  Pain in your breasts.  Anything that concerns you. This information is not intended to  replace advice given to you by your health care provider. Make sure you discuss any questions you have with your health care provider. Document Released: 05/20/2005 Document Revised: 10/26/2015 Document Reviewed: 04/09/2015 Elsevier Interactive Patient Education  2017 [employer]  

## 2016-08-13 LAB — CBC WITH DIFFERENTIAL/PLATELET
BASOS: 0 %
Basophils Absolute: 0 10*3/uL (ref 0.0–0.2)
EOS (ABSOLUTE): 0.2 10*3/uL (ref 0.0–0.4)
EOS: 4 %
HEMOGLOBIN: 14 g/dL (ref 11.1–15.9)
Hematocrit: 43.1 % (ref 34.0–46.6)
IMMATURE GRANS (ABS): 0 10*3/uL (ref 0.0–0.1)
IMMATURE GRANULOCYTES: 0 %
LYMPHS: 27 %
Lymphocytes Absolute: 1.3 10*3/uL (ref 0.7–3.1)
MCH: 28.3 pg (ref 26.6–33.0)
MCHC: 32.5 g/dL (ref 31.5–35.7)
MCV: 87 fL (ref 79–97)
MONOCYTES: 7 %
Monocytes Absolute: 0.4 10*3/uL (ref 0.1–0.9)
NEUTROS ABS: 3 10*3/uL (ref 1.4–7.0)
NEUTROS PCT: 62 %
PLATELETS: 260 10*3/uL (ref 150–379)
RBC: 4.95 x10E6/uL (ref 3.77–5.28)
RDW: 15.1 % (ref 12.3–15.4)
WBC: 4.8 10*3/uL (ref 3.4–10.8)

## 2016-08-13 LAB — HEMOGLOBIN A1C
Est. average glucose Bld gHb Est-mCnc: 120 mg/dL
HEMOGLOBIN A1C: 5.8 % — AB (ref 4.8–5.6)

## 2016-08-13 LAB — COMPREHENSIVE METABOLIC PANEL
A/G RATIO: 1.7 (ref 1.2–2.2)
ALBUMIN: 4.1 g/dL (ref 3.5–5.5)
ALT: 16 IU/L (ref 0–32)
AST: 15 IU/L (ref 0–40)
Alkaline Phosphatase: 94 IU/L (ref 39–117)
BUN/Creatinine Ratio: 26 — ABNORMAL HIGH (ref 9–23)
BUN: 18 mg/dL (ref 6–24)
Bilirubin Total: 0.3 mg/dL (ref 0.0–1.2)
CALCIUM: 9 mg/dL (ref 8.7–10.2)
CO2: 26 mmol/L (ref 18–29)
Chloride: 104 mmol/L (ref 96–106)
Creatinine, Ser: 0.68 mg/dL (ref 0.57–1.00)
GFR, EST AFRICAN AMERICAN: 113 mL/min/{1.73_m2} (ref >59)
GFR, EST NON AFRICAN AMERICAN: 98 mL/min/{1.73_m2} (ref >59)
Globulin, Total: 2.4 g/dL (ref 1.5–4.5)
Glucose: 107 mg/dL — ABNORMAL HIGH (ref 65–99)
POTASSIUM: 5.2 mmol/L (ref 3.5–5.2)
Sodium: 145 mmol/L — ABNORMAL HIGH (ref 134–144)
TOTAL PROTEIN: 6.5 g/dL (ref 6.0–8.5)

## 2016-08-13 LAB — LIPID PANEL
CHOL/HDL RATIO: 4.4 ratio (ref 0.0–4.4)
Cholesterol, Total: 218 mg/dL — ABNORMAL HIGH (ref 100–199)
HDL: 49 mg/dL (ref >39)
LDL Calculated: 143 mg/dL — ABNORMAL HIGH (ref 0–99)
Triglycerides: 130 mg/dL (ref 0–149)
VLDL CHOLESTEROL CAL: 26 mg/dL (ref 5–40)

## 2016-08-13 LAB — TSH: TSH: 0.843 u[IU]/mL (ref 0.450–4.500)

## 2016-08-14 ENCOUNTER — Other Ambulatory Visit: Payer: Self-pay | Admitting: Internal Medicine

## 2016-08-14 DIAGNOSIS — Z1231 Encounter for screening mammogram for malignant neoplasm of breast: Secondary | ICD-10-CM

## 2016-08-19 ENCOUNTER — Ambulatory Visit
Admission: RE | Admit: 2016-08-19 | Discharge: 2016-08-19 | Disposition: A | Discharge status: Home / Self Care | Payer: BLUE CROSS/BLUE SHIELD | Source: Ambulatory Visit | Attending: Internal Medicine | Admitting: Internal Medicine

## 2016-08-19 DIAGNOSIS — Z1231 Encounter for screening mammogram for malignant neoplasm of breast: Secondary | ICD-10-CM

## 2017-07-24 ENCOUNTER — Ambulatory Visit: Payer: BLUE CROSS/BLUE SHIELD | Admitting: Internal Medicine

## 2017-07-24 ENCOUNTER — Encounter: Payer: Self-pay | Admitting: Internal Medicine

## 2017-07-24 VITALS — BP 116/78 | HR 84 | Ht 65.0 in | Wt 214.0 lb

## 2017-07-24 DIAGNOSIS — H8309 Labyrinthitis, unspecified ear: Secondary | ICD-10-CM

## 2017-07-24 NOTE — Progress Notes (Signed)
    Date:  07/24/2017   Name:  Madeline PollackJudith Ann Ridgeway   DOB:  11/01/1960   MRN:  [id number]   Chief Complaint: Dizziness (dizziness is worse with motion. X 1 week. Sinus medicine- sudafed has helped a little but not completely. Has not taken anything today. ) She has has sx for the past week.  The first three days were worse, now it seems to be improving.  She has mild nausea, no vomiting, no ear discharge.  Sx worse with looking up or turning head quickly.   Review of Systems  Constitutional: Negative for chills, fatigue and fever.  Eyes: Negative for visual disturbance.  Respiratory: Negative for cough, chest tightness, shortness of breath and wheezing.   Cardiovascular: Negative for chest pain and palpitations.  Gastrointestinal: Positive for nausea. Negative for vomiting.  Neurological: Positive for dizziness. Negative for tremors, light-headedness and headaches.    Patient Active Problem List   Diagnosis Date Noted  . Hyperlipidemia, mild 08/12/2016  . Depression, major, single episode, complete remission (HCC) 12/16/2014  . Back muscle spasm 12/16/2014  . GERD (gastroesophageal reflux disease) 11/09/2014  . Mixed urge and stress incontinence 11/09/2014  . Osteoarthritis 11/09/2014    Prior to Admission medications   Medication Sig Start Date End Date Taking? Authorizing Provider  omeprazole (PRILOSEC) 20 MG capsule TAKE 1 CAPSULE DAILY 01/16/15  Yes Reubin MilanBerglund, Gregorey Nabor H, MD    No Known Allergies  Past Surgical History:  Procedure Laterality Date  . RIGHT OOPHORECTOMY    . TUBAL LIGATION      Social History   Tobacco Use  . Smoking status: Never Smoker  . Smokeless tobacco: Never Used  Substance Use Topics  . Alcohol use: Yes    Alcohol/week: 1.2 oz    Types: 2 Standard drinks or equivalent per week  . Drug use: No     Medication list has been reviewed and updated.  No flowsheet data found.  Physical Exam  Constitutional: She is oriented to person, place, and  time. She appears well-developed. No distress.  HENT:  Head: Normocephalic and atraumatic.  Eyes: Pupils are equal, round, and reactive to light. Right eye exhibits nystagmus. Right eye exhibits normal extraocular motion. Left eye exhibits nystagmus. Left eye exhibits normal extraocular motion.  Cardiovascular: Normal rate, regular rhythm and normal heart sounds.  Pulmonary/Chest: Effort normal and breath sounds normal. No respiratory distress. She has no decreased breath sounds. She has no wheezes.  Musculoskeletal: Normal range of motion.  Neurological: She is alert and oriented to person, place, and time.  Skin: Skin is warm and dry. No rash noted.  Psychiatric: She has a normal mood and affect. Her behavior is normal. Thought content normal.  Nursing note and vitals reviewed.   BP 116/78   Pulse 84   Ht 5\' 5"  (1.651 m)   Wt 214 lb (97.1 kg)   SpO2 99%   BMI 35.61 kg/m   Assessment and Plan: 1. Viral labyrinthitis, unspecified laterality Continue supportive care Follow up if worsening   No orders of the defined types were placed in this encounter.   Partially dictated using Animal nutritionistDragon software. Any errors are unintentional.  Bari EdwardLaura Javoris Star, MD [facility]  07/24/2017

## 2017-08-13 ENCOUNTER — Encounter: Payer: BLUE CROSS/BLUE SHIELD | Admitting: Internal Medicine

## 2017-08-21 ENCOUNTER — Encounter: Payer: Self-pay | Admitting: Internal Medicine

## 2017-08-21 ENCOUNTER — Ambulatory Visit (INDEPENDENT_AMBULATORY_CARE_PROVIDER_SITE_OTHER): Payer: BLUE CROSS/BLUE SHIELD | Admitting: Internal Medicine

## 2017-08-21 VITALS — BP 124/68 | HR 71 | Ht 65.0 in | Wt 212.0 lb

## 2017-08-21 DIAGNOSIS — Z0001 Encounter for general adult medical examination with abnormal findings: Secondary | ICD-10-CM

## 2017-08-21 DIAGNOSIS — M720 Palmar fascial fibromatosis [Dupuytren]: Secondary | ICD-10-CM | POA: Diagnosis not present

## 2017-08-21 DIAGNOSIS — E785 Hyperlipidemia, unspecified: Secondary | ICD-10-CM | POA: Diagnosis not present

## 2017-08-21 DIAGNOSIS — M62830 Muscle spasm of back: Secondary | ICD-10-CM

## 2017-08-21 DIAGNOSIS — Z0000 Encounter for general adult medical examination without abnormal findings: Secondary | ICD-10-CM

## 2017-08-21 DIAGNOSIS — K219 Gastro-esophageal reflux disease without esophagitis: Secondary | ICD-10-CM | POA: Diagnosis not present

## 2017-08-21 DIAGNOSIS — Z1239 Encounter for other screening for malignant neoplasm of breast: Secondary | ICD-10-CM

## 2017-08-21 LAB — POCT URINALYSIS DIPSTICK
Bilirubin, UA: NEGATIVE
Blood, UA: NEGATIVE
Glucose, UA: NEGATIVE
KETONES UA: NEGATIVE
Leukocytes, UA: NEGATIVE
NITRITE UA: NEGATIVE
PH UA: 6 (ref 5.0–8.0)
PROTEIN UA: NEGATIVE
SPEC GRAV UA: 1.01 (ref 1.010–1.025)
UROBILINOGEN UA: 0.2 U/dL

## 2017-08-21 MED ORDER — METHOCARBAMOL 500 MG PO TABS: 500.0000 mg | ORAL_TABLET | Freq: Three times a day (TID) | ORAL | 1 refills | Status: DC | PRN

## 2017-08-21 NOTE — Progress Notes (Signed)
Date:  08/21/2017   Name:  Madeline Moon   DOB:  January 27, 1961   MRN:  [id number]   Chief Complaint: Annual Exam (Pap next year. Breast Exam today. ) Madeline Moon is a 57 y.o. female who presents today for her Complete Annual Exam. She feels well. She reports exercising some. She reports she is sleeping fairly well.  Last Pap with HPV 06/2013 was normal.  Last mammogram 08/2016.  She denies breast issues.  Gastroesophageal Reflux  She complains of heartburn. She reports no abdominal pain, no chest pain, no choking, no coughing, no dysphagia or no wheezing. This is a recurrent problem. The problem occurs occasionally. Pertinent negatives include no fatigue. She has tried a PPI for the symptoms.  Back Pain  This is a chronic problem. The current episode started more than 1 year ago. The problem occurs intermittently. The problem has been waxing and waning since onset. The pain is present in the lumbar spine. The quality of the pain is described as aching and cramping. The pain does not radiate. The pain is mild. Stiffness is present in the morning. Pertinent negatives include no abdominal pain, chest pain, fever or headaches. She has tried nothing for the symptoms.      Review of Systems  Constitutional: Negative for chills, fatigue, fever and unexpected weight change.  HENT: Positive for postnasal drip, rhinorrhea and sneezing.   Eyes: Negative for visual disturbance.  Respiratory: Negative for cough, choking, chest tightness, shortness of breath and wheezing.   Cardiovascular: Negative for chest pain, palpitations and leg swelling.  Gastrointestinal: Positive for heartburn. Negative for abdominal pain, constipation, diarrhea and dysphagia.  Genitourinary: Negative for difficulty urinating, flank pain, genital sores, hematuria and vaginal discharge.  Musculoskeletal: Positive for back pain and myalgias. Negative for arthralgias and joint swelling.  Skin: Negative for rash.    Allergic/Immunologic: Positive for environmental allergies. Negative for food allergies.  Neurological: Negative for dizziness and headaches.  Hematological: Negative for adenopathy.  Psychiatric/Behavioral: Negative for dysphoric mood, hallucinations and sleep disturbance.    Patient Active Problem List   Diagnosis Date Noted  . Hyperlipidemia, mild 08/12/2016  . Depression, major, single episode, complete remission (HCC) 12/16/2014  . Back muscle spasm 12/16/2014  . GERD (gastroesophageal reflux disease) 11/09/2014  . Mixed urge and stress incontinence 11/09/2014  . Osteoarthritis 11/09/2014    Prior to Admission medications   Medication Sig Start Date End Date Taking? Authorizing Provider  omeprazole (PRILOSEC) 20 MG capsule TAKE 1 CAPSULE DAILY 01/16/15   Reubin Milan, MD    No Known Allergies  Past Surgical History:  Procedure Laterality Date  . RIGHT OOPHORECTOMY    . TUBAL LIGATION      Social History   Tobacco Use  . Smoking status: Never Smoker  . Smokeless tobacco: Never Used  Substance Use Topics  . Alcohol use: Yes    Alcohol/week: 1.2 oz    Types: 2 Standard drinks or equivalent per week  . Drug use: No     Medication list has been reviewed and updated.  PHQ 2/9 Scores 08/21/2017  PHQ - 2 Score 0  PHQ- 9 Score 0    Physical Exam  Constitutional: She is oriented to person, place, and time. She appears well-developed and well-nourished. No distress.  HENT:  Head: Normocephalic and atraumatic.  Right Ear: Tympanic membrane and ear canal normal.  Left Ear: Tympanic membrane and ear canal normal.  Nose: Right sinus exhibits no maxillary sinus tenderness. Left  sinus exhibits no maxillary sinus tenderness.  Mouth/Throat: Uvula is midline and oropharynx is clear and moist.  Eyes: Conjunctivae and EOM are normal. Right eye exhibits no discharge. Left eye exhibits no discharge. No scleral icterus.  Neck: Normal range of motion. Carotid bruit is not  present. No erythema present. No thyromegaly present.  Cardiovascular: Normal rate, regular rhythm, normal heart sounds and normal pulses.  Pulmonary/Chest: Effort normal. No respiratory distress. She has no wheezes. Right breast exhibits no mass, no nipple discharge, no skin change and no tenderness. Left breast exhibits no mass, no nipple discharge, no skin change and no tenderness.  Abdominal: Soft. Bowel sounds are normal. There is no hepatosplenomegaly. There is no tenderness. There is no CVA tenderness.  Musculoskeletal: Normal range of motion. She exhibits no edema or tenderness.       Lumbar back: She exhibits spasm. She exhibits no tenderness.       Arms: Lymphadenopathy:    She has no cervical adenopathy.    She has no axillary adenopathy.  Neurological: She is alert and oriented to person, place, and time. She has normal strength and normal reflexes. No cranial nerve deficit or sensory deficit. Gait normal.  Skin: Skin is warm, dry and intact. No rash noted.  Psychiatric: She has a normal mood and affect. Her speech is normal and behavior is normal. Thought content normal.  Nursing note and vitals reviewed.   BP 124/68   Pulse 71   Ht 5\' 5"  (1.651 m)   Wt 212 lb (96.2 kg)   SpO2 99%   BMI 35.28 kg/m   Assessment and Plan: 1. Annual physical exam Normal exam except for weight - TSH - POCT urinalysis dipstick  2. Breast cancer screening - MM DIGITAL SCREENING BILATERAL; Future  3. Gastroesophageal reflux disease, esophagitis presence not specified Controlled with PPI - CBC with Differential/Platelet  4. Hyperlipidemia, mild Continue low fat diet - Comprehensive metabolic panel - Lipid panel  5. Palmar fasciitis Stable, follow up with Ortho  6. Spasm of muscle of lower back Recommend stretching and heat Robaxin as needed   No orders of the defined types were placed in this encounter.   Partially dictated using Animal nutritionistDragon software. Any errors are  unintentional.  Bari EdwardLaura Mylea Roarty, MD [facility]  08/21/2017

## 2017-08-22 LAB — CBC WITH DIFFERENTIAL/PLATELET
Basophils Absolute: 0 10*3/uL (ref 0.0–0.2)
Basos: 0 %
EOS (ABSOLUTE): 0.2 10*3/uL (ref 0.0–0.4)
Eos: 4 %
Hematocrit: 43.1 % (ref 34.0–46.6)
Hemoglobin: 13.6 g/dL (ref 11.1–15.9)
Immature Grans (Abs): 0 10*3/uL (ref 0.0–0.1)
Immature Granulocytes: 0 %
LYMPHS ABS: 1.4 10*3/uL (ref 0.7–3.1)
Lymphs: 33 %
MCH: 27 pg (ref 26.6–33.0)
MCHC: 31.6 g/dL (ref 31.5–35.7)
MCV: 86 fL (ref 79–97)
MONOCYTES: 11 %
MONOS ABS: 0.5 10*3/uL (ref 0.1–0.9)
Neutrophils Absolute: 2.1 10*3/uL (ref 1.4–7.0)
Neutrophils: 52 %
PLATELETS: 286 10*3/uL (ref 150–379)
RBC: 5.03 x10E6/uL (ref 3.77–5.28)
RDW: 15 % (ref 12.3–15.4)
WBC: 4.2 10*3/uL (ref 3.4–10.8)

## 2017-08-22 LAB — LIPID PANEL
CHOLESTEROL TOTAL: 205 mg/dL — AB (ref 100–199)
Chol/HDL Ratio: 3.9 ratio (ref 0.0–4.4)
HDL: 53 mg/dL (ref >39)
LDL CALC: 135 mg/dL — AB (ref 0–99)
TRIGLYCERIDES: 85 mg/dL (ref 0–149)
VLDL CHOLESTEROL CAL: 17 mg/dL (ref 5–40)

## 2017-08-22 LAB — COMPREHENSIVE METABOLIC PANEL
ALK PHOS: 96 IU/L (ref 39–117)
ALT: 17 IU/L (ref 0–32)
AST: 18 IU/L (ref 0–40)
Albumin/Globulin Ratio: 1.9 (ref 1.2–2.2)
Albumin: 4.5 g/dL (ref 3.5–5.5)
BILIRUBIN TOTAL: 0.2 mg/dL (ref 0.0–1.2)
BUN/Creatinine Ratio: 17 (ref 9–23)
BUN: 13 mg/dL (ref 6–24)
CHLORIDE: 106 mmol/L (ref 96–106)
CO2: 24 mmol/L (ref 20–29)
CREATININE: 0.75 mg/dL (ref 0.57–1.00)
Calcium: 9.1 mg/dL (ref 8.7–10.2)
GFR calc Af Amer: 102 mL/min/{1.73_m2} (ref >59)
GFR calc non Af Amer: 89 mL/min/{1.73_m2} (ref >59)
GLUCOSE: 112 mg/dL — AB (ref 65–99)
Globulin, Total: 2.4 g/dL (ref 1.5–4.5)
Potassium: 4.8 mmol/L (ref 3.5–5.2)
Sodium: 144 mmol/L (ref 134–144)
Total Protein: 6.9 g/dL (ref 6.0–8.5)

## 2017-08-22 LAB — TSH: TSH: 1.33 u[IU]/mL (ref 0.450–4.500)

## 2017-09-03 ENCOUNTER — Inpatient Hospital Stay: Admission: RE | Admit: 2017-09-03 | Payer: BLUE CROSS/BLUE SHIELD | Source: Ambulatory Visit

## 2017-09-09 ENCOUNTER — Ambulatory Visit
Admission: RE | Admit: 2017-09-09 | Discharge: 2017-09-09 | Disposition: A | Discharge status: Home / Self Care | Payer: BLUE CROSS/BLUE SHIELD | Source: Ambulatory Visit | Attending: Internal Medicine | Admitting: Internal Medicine

## 2017-09-09 DIAGNOSIS — Z1231 Encounter for screening mammogram for malignant neoplasm of breast: Secondary | ICD-10-CM | POA: Diagnosis not present

## 2017-09-09 DIAGNOSIS — Z1239 Encounter for other screening for malignant neoplasm of breast: Secondary | ICD-10-CM

## 2018-08-24 ENCOUNTER — Encounter: Payer: BLUE CROSS/BLUE SHIELD | Admitting: Internal Medicine

## 2018-10-06 ENCOUNTER — Encounter: Payer: Self-pay | Admitting: Internal Medicine

## 2018-10-06 ENCOUNTER — Other Ambulatory Visit: Payer: Self-pay

## 2018-10-06 ENCOUNTER — Ambulatory Visit (INDEPENDENT_AMBULATORY_CARE_PROVIDER_SITE_OTHER): Payer: BLUE CROSS/BLUE SHIELD | Admitting: Internal Medicine

## 2018-10-06 ENCOUNTER — Other Ambulatory Visit (HOSPITAL_COMMUNITY)
Admission: RE | Admit: 2018-10-06 | Discharge: 2018-10-06 | Disposition: A | Discharge status: Home / Self Care | Payer: BLUE CROSS/BLUE SHIELD | Source: Ambulatory Visit | Attending: Internal Medicine | Admitting: Internal Medicine

## 2018-10-06 VITALS — BP 122/70 | HR 77 | Ht 65.0 in | Wt 209.6 lb

## 2018-10-06 DIAGNOSIS — K219 Gastro-esophageal reflux disease without esophagitis: Secondary | ICD-10-CM | POA: Insufficient documentation

## 2018-10-06 DIAGNOSIS — M720 Palmar fascial fibromatosis [Dupuytren]: Secondary | ICD-10-CM | POA: Insufficient documentation

## 2018-10-06 DIAGNOSIS — Z124 Encounter for screening for malignant neoplasm of cervix: Secondary | ICD-10-CM | POA: Diagnosis not present

## 2018-10-06 DIAGNOSIS — Z23 Encounter for immunization: Secondary | ICD-10-CM | POA: Diagnosis not present

## 2018-10-06 DIAGNOSIS — E785 Hyperlipidemia, unspecified: Secondary | ICD-10-CM

## 2018-10-06 DIAGNOSIS — Z1231 Encounter for screening mammogram for malignant neoplasm of breast: Secondary | ICD-10-CM | POA: Diagnosis not present

## 2018-10-06 DIAGNOSIS — Z0000 Encounter for general adult medical examination without abnormal findings: Secondary | ICD-10-CM

## 2018-10-06 DIAGNOSIS — N3281 Overactive bladder: Secondary | ICD-10-CM

## 2018-10-06 LAB — POCT URINALYSIS DIPSTICK
Bilirubin, UA: NEGATIVE
Blood, UA: NEGATIVE
Glucose, UA: NEGATIVE
Ketones, UA: NEGATIVE
Leukocytes, UA: NEGATIVE
Nitrite, UA: NEGATIVE
Protein, UA: NEGATIVE
Spec Grav, UA: 1.02 (ref 1.010–1.025)
Urobilinogen, UA: 0.2 E.U./dL
pH, UA: 5 (ref 5.0–8.0)

## 2018-10-06 MED ORDER — OXYBUTYNIN CHLORIDE ER 10 MG PO TB24: 10.0000 mg | ORAL_TABLET | Freq: Every day | ORAL | 5 refills | Status: DC

## 2018-10-06 NOTE — Progress Notes (Signed)
Date:  10/06/2018   Name:  Madeline Moon   DOB:  [birth year]   MRN:  [id number]   Chief Complaint: No chief complaint on file. Madeline PollackJudith Moon Moon is a 58 y.o. female who presents today for her Complete Annual Exam. She feels well. She reports exercising some walking. She reports she is sleeping fairly well. She is coping with Covid-19 restrictions, working from home as usual.    Mammogram 09/2017 Colonoscopy 2011 Pap smear 2015  Gastroesophageal Reflux  She complains of heartburn. She reports no abdominal pain, no chest pain, no coughing or no wheezing. This is a recurrent problem. The problem occurs occasionally. Pertinent negatives include no fatigue. She has tried a PPI for the symptoms.  Urinary Frequency   This is a recurrent problem. The problem occurs intermittently. The patient is experiencing no pain. There has been no fever. Pertinent negatives include no chills, frequency, hematuria or vomiting. Treatments tried: ditropan.   Lab Results  Component Value Date   CHOL 205 (H) 08/21/2017   HDL 53 08/21/2017   LDLCALC 135 (H) 08/21/2017   TRIG 85 08/21/2017   CHOLHDL 3.9 08/21/2017   Lab Results  Component Value Date   CREATININE 0.75 08/21/2017   BUN 13 08/21/2017   NA 144 08/21/2017   K 4.8 08/21/2017   CL 106 08/21/2017   CO2 24 08/21/2017   Lab Results  Component Value Date   TSH 1.330 08/21/2017   Lab Results  Component Value Date   WBC 4.2 08/21/2017   HGB 13.6 08/21/2017   HCT 43.1 08/21/2017   MCV 86 08/21/2017   PLT 286 08/21/2017     Review of Systems  Constitutional: Negative for chills, fatigue and fever.  HENT: Negative for congestion, hearing loss, tinnitus, trouble swallowing and voice change.   Eyes: Negative for visual disturbance.  Respiratory: Negative for cough, chest tightness, shortness of breath and wheezing.   Cardiovascular: Negative for chest pain, palpitations and leg swelling.  Gastrointestinal: Positive for heartburn.  Negative for abdominal pain, constipation, diarrhea and vomiting.  Endocrine: Negative for polydipsia and polyuria.  Genitourinary: Negative for dysuria, frequency, genital sores, hematuria, vaginal bleeding and vaginal discharge.       OAB  Musculoskeletal: Negative for arthralgias, gait problem and joint swelling.       Palmar fasciitis  Skin: Negative for color change and rash.  Neurological: Negative for dizziness, tremors, light-headedness and headaches.  Hematological: Negative for adenopathy. Does not bruise/bleed easily.  Psychiatric/Behavioral: Negative for dysphoric mood and sleep disturbance. The patient is not nervous/anxious.     Patient Active Problem List   Diagnosis Date Noted  . Gastroesophageal reflux disease without esophagitis 10/06/2018  . Palmar fasciitis 08/21/2017  . Hyperlipidemia, mild 08/12/2016  . Back muscle spasm 12/16/2014  . Mixed urge and stress incontinence 11/09/2014  . Osteoarthritis 11/09/2014    No Known Allergies  Past Surgical History:  Procedure Laterality Date  . RIGHT OOPHORECTOMY    . TUBAL LIGATION      Social History   Tobacco Use  . Smoking status: Never Smoker  . Smokeless tobacco: Never Used  Substance Use Topics  . Alcohol use: Yes    Alcohol/week: 2.0 standard drinks    Types: 2 Standard drinks or equivalent per week  . Drug use: No     Medication list has been reviewed and updated.  Current Meds  Medication Sig  . diphenhydrAMINE (BENADRYL) 25 mg capsule Take 25 mg by mouth every 6 (six)  hours as needed.  . methocarbamol (ROBAXIN) 500 MG tablet Take 1 tablet (500 mg total) by mouth every 8 (eight) hours as needed for muscle spasms.  Marland Kitchen omeprazole (PRILOSEC) 20 MG capsule TAKE 1 CAPSULE DAILY    PHQ 2/9 Scores 10/06/2018 08/21/2017  PHQ - 2 Score 1 0  PHQ- 9 Score - 0    BP Readings from Last 3 Encounters:  10/06/18 122/70  08/21/17 124/68  07/24/17 116/78    Physical Exam Vitals signs and nursing note  reviewed.  Constitutional:      General: She is not in acute distress.    Appearance: She is well-developed.  HENT:     Head: Normocephalic and atraumatic.     Right Ear: Tympanic membrane and ear canal normal.     Left Ear: Tympanic membrane and ear canal normal.     Nose:     Right Sinus: No maxillary sinus tenderness.     Left Sinus: No maxillary sinus tenderness.     Mouth/Throat:     Pharynx: Uvula midline.  Eyes:     General: No scleral icterus.       Right eye: No discharge.        Left eye: No discharge.     Conjunctiva/sclera: Conjunctivae normal.  Neck:     Musculoskeletal: Normal range of motion. No erythema.     Thyroid: No thyromegaly.     Vascular: No carotid bruit.  Cardiovascular:     Rate and Rhythm: Normal rate and regular rhythm.     Pulses: Normal pulses.     Heart sounds: Normal heart sounds.  Pulmonary:     Effort: Pulmonary effort is normal. No respiratory distress.     Breath sounds: No wheezing.  Chest:     Breasts:        Right: No mass, nipple discharge, skin change or tenderness.        Left: No mass, nipple discharge, skin change or tenderness.  Abdominal:     General: Bowel sounds are normal.     Palpations: Abdomen is soft.     Tenderness: There is no abdominal tenderness.  Genitourinary:    Labia:        Right: No tenderness or lesion.        Left: No tenderness or lesion.      Urethra: Prolapse present.     Vagina: Normal.     Cervix: Normal.     Uterus: Normal.      Adnexa: Right adnexa normal and left adnexa normal.     Comments: Pap specimen obtained Musculoskeletal: Normal range of motion.       Arms:  Lymphadenopathy:     Cervical: No cervical adenopathy.  Skin:    General: Skin is warm and dry.     Findings: No rash.  Neurological:     Mental Status: She is alert and oriented to person, place, and time.     Cranial Nerves: No cranial nerve deficit.     Sensory: No sensory deficit.     Deep Tendon Reflexes: Reflexes are  normal and symmetric.  Psychiatric:        Attention and Perception: Attention normal.        Mood and Affect: Mood normal.        Speech: Speech normal.        Behavior: Behavior normal.        Thought Content: Thought content normal.        Cognition and  Memory: Cognition normal.        Judgment: Judgment normal.     Wt Readings from Last 3 Encounters:  10/06/18 209 lb 9.6 oz (95.1 kg)  08/21/17 212 lb (96.2 kg)  07/24/17 214 lb (97.1 kg)    BP 122/70   Pulse 77   Ht  (1.651 m)   Wt 209 lb 9.6 oz (95.1 kg)   SpO2 99%   BMI 34.88 kg/m   Assessment and Plan: 1. Annual physical exam Normal exam except for weight - work on diet and exercise - Comprehensive metabolic panel - Hemoglobin A1c - POCT urinalysis dipstick - TSH  2. Encounter for screening mammogram for breast cancer Schedule at [facility] due to insurance - MM 3D SCREEN BREAST BILATERAL; Future  3. Encounter for Papanicolaou smear for cervical cancer screening - Cytology - PAP  4. Gastroesophageal reflux disease without esophagitis Controlled on PPI - CBC with Differential/Platelet  5. Hyperlipidemia, mild Check labs and advise - Lipid panel  6. OAB (overactive bladder) - oxybutynin (DITROPAN XL) 10 MG 24 hr tablet; Take 1 tablet (10 mg total) by mouth at bedtime.  Dispense: 30 tablet; Refill: 5  7. Need for shingles vaccine First dose today - Varicella-zoster vaccine IM  8. Palmar fascial fibromatosis Consider follow up with Hand specialist if worsening   Partially dictated using Dragon software. Any errors are unintentional.  Bari Edward, MD [address] [facility]  10/06/2018

## 2018-10-07 LAB — COMPREHENSIVE METABOLIC PANEL
ALT: 18 IU/L (ref 0–32)
AST: 19 IU/L (ref 0–40)
Albumin/Globulin Ratio: 1.9 (ref 1.2–2.2)
Albumin: 4.4 g/dL (ref 3.8–4.9)
Alkaline Phosphatase: 99 IU/L (ref 39–117)
BUN/Creatinine Ratio: 16 (ref 9–23)
BUN: 14 mg/dL (ref 6–24)
Bilirubin Total: 0.3 mg/dL (ref 0.0–1.2)
CO2: 23 mmol/L (ref 20–29)
Calcium: 8.8 mg/dL (ref 8.7–10.2)
Chloride: 105 mmol/L (ref 96–106)
Creatinine, Ser: 0.88 mg/dL (ref 0.57–1.00)
GFR calc Af Amer: 84 mL/min/{1.73_m2} (ref >59)
GFR calc non Af Amer: 73 mL/min/{1.73_m2} (ref >59)
Globulin, Total: 2.3 g/dL (ref 1.5–4.5)
Glucose: 116 mg/dL — ABNORMAL HIGH (ref 65–99)
Potassium: 4.8 mmol/L (ref 3.5–5.2)
Sodium: 141 mmol/L (ref 134–144)
Total Protein: 6.7 g/dL (ref 6.0–8.5)

## 2018-10-07 LAB — CBC WITH DIFFERENTIAL/PLATELET
Basophils Absolute: 0 10*3/uL (ref 0.0–0.2)
Basos: 1 %
EOS (ABSOLUTE): 0.2 10*3/uL (ref 0.0–0.4)
Eos: 4 %
Hematocrit: 36 % (ref 34.0–46.6)
Hemoglobin: 11.3 g/dL (ref 11.1–15.9)
Immature Grans (Abs): 0 10*3/uL (ref 0.0–0.1)
Immature Granulocytes: 0 %
Lymphocytes Absolute: 1.4 10*3/uL (ref 0.7–3.1)
Lymphs: 30 %
MCH: 24.9 pg — ABNORMAL LOW (ref 26.6–33.0)
MCHC: 31.4 g/dL — ABNORMAL LOW (ref 31.5–35.7)
MCV: 80 fL (ref 79–97)
Monocytes Absolute: 0.4 10*3/uL (ref 0.1–0.9)
Monocytes: 10 %
Neutrophils Absolute: 2.6 10*3/uL (ref 1.4–7.0)
Neutrophils: 55 %
Platelets: 357 10*3/uL (ref 150–450)
RBC: 4.53 x10E6/uL (ref 3.77–5.28)
RDW: 14.6 % (ref 11.7–15.4)
WBC: 4.5 10*3/uL (ref 3.4–10.8)

## 2018-10-07 LAB — CYTOLOGY - PAP
Diagnosis: NEGATIVE
HPV: NOT DETECTED

## 2018-10-07 LAB — HEMOGLOBIN A1C
Est. average glucose Bld gHb Est-mCnc: 134 mg/dL
Hgb A1c MFr Bld: 6.3 % — ABNORMAL HIGH (ref 4.8–5.6)

## 2018-10-07 LAB — LIPID PANEL
Chol/HDL Ratio: 4.4 ratio (ref 0.0–4.4)
Cholesterol, Total: 231 mg/dL — ABNORMAL HIGH (ref 100–199)
HDL: 53 mg/dL (ref >39)
LDL Calculated: 152 mg/dL — ABNORMAL HIGH (ref 0–99)
Triglycerides: 128 mg/dL (ref 0–149)
VLDL Cholesterol Cal: 26 mg/dL (ref 5–40)

## 2018-10-07 LAB — TSH: TSH: 1.98 u[IU]/mL (ref 0.450–4.500)

## 2018-10-22 ENCOUNTER — Telehealth: Payer: Self-pay

## 2018-10-22 NOTE — Telephone Encounter (Signed)
Patient seen last week for CPE. She mentioned dry mouth issues then and in past. States she has since stopped all meds x 1 week and changed diet drastically. Not having any reflux and still has dry mouth severely. Would like to try Biotin or see what you recommend. Call back [phone] Lum Babe.

## 2018-10-22 NOTE — Telephone Encounter (Signed)
She can try Biotene products.  They are over the counter - mouthwash and toothpaste.

## 2018-10-23 NOTE — Telephone Encounter (Signed)
Advised-jh 

## 2019-01-14 ENCOUNTER — Other Ambulatory Visit: Payer: Self-pay

## 2019-01-14 DIAGNOSIS — Z23 Encounter for immunization: Secondary | ICD-10-CM

## 2019-06-04 DIAGNOSIS — M25511 Pain in right shoulder: Secondary | ICD-10-CM

## 2019-06-04 HISTORY — DX: Pain in right shoulder: M25.511

## 2019-09-28 ENCOUNTER — Ambulatory Visit: Payer: Self-pay | Admitting: *Deleted

## 2019-09-28 NOTE — Telephone Encounter (Signed)
Pt called stating her BP has been elevated since 09/25/19; her readings were obtained with a right wrist cuff;; her readings are:  09/28/19 at 0700 180/100 09/28/19 at 0800 163/93 09/27/19 162/90 09/26/19 148/88 4/24/21138/80 The pt says she normally runs 100/60; she states that she started taking her BP because her face felt flushed; the states she had a right ear ache on 09/27/19 but it is now resolved; the pt also states she has a headache which started om 09/27/19 and rated as 2 out of 10; the pt states had a steroid shot in her shoulder on 09/23/19; recommendations made per nurse triage protocol; the pt states she is unable to have visit this AM because she has to take her husband to an appt; decision tree completed; pt offered and accepted virtual appt 09/29/19 at 1040 with Dr Judithann Graves, [facility]; she verbalized understanding; will route to office for notification.  Reason for Disposition . Systolic BP  >= 180 OR Diastolic >= 110  Answer Assessment - Initial Assessment Questions 1. BLOOD PRESSURE: "What is the blood pressure?" "Did you take at least tw/o measurements 5 minutes apart?" 09/28/19 180/100 at 0700, and 163/93 at 0800    2. ONSET: "When did you take your blood pressure?"   09/28/19 at 0700 and 0800 3. HOW: "How did you obtain the blood pressure?" (e.g., visiting nurse, automatic home BP monitor)     Home cuff on right cuff 4. HISTORY: "Do you have a history of high blood pressure?"    no 5. MEDICATIONS: "Are you taking any medications for blood pressure?" "Have you missed any doses recently?"    no 6. OTHER SYMPTOMS: "Do you have any symptoms?" (e.g., headache, chest pain, blurred vision, difficulty breathing, weakness)     Mild headache rated 2 out of 10 7. PREGNANCY: "Is there any chance you are pregnant?" "When was your last menstrual period?"    No ablation  Protocols used: HIGH BLOOD PRESSURE-A-AH

## 2019-09-29 ENCOUNTER — Encounter: Payer: Self-pay | Admitting: Internal Medicine

## 2019-09-29 ENCOUNTER — Ambulatory Visit (INDEPENDENT_AMBULATORY_CARE_PROVIDER_SITE_OTHER): Payer: 59 | Admitting: Internal Medicine

## 2019-09-29 ENCOUNTER — Other Ambulatory Visit: Payer: Self-pay

## 2019-09-29 VITALS — BP 148/82 | HR 59 | Temp 98.7°F | Ht 65.0 in | Wt 200.0 lb

## 2019-09-29 DIAGNOSIS — R030 Elevated blood-pressure reading, without diagnosis of hypertension: Secondary | ICD-10-CM | POA: Diagnosis not present

## 2019-09-29 MED ORDER — TRIAMTERENE-HCTZ 37.5-25 MG PO TABS: 1.0000 | ORAL_TABLET | Freq: Every day | ORAL | 0 refills | Status: DC

## 2019-09-29 NOTE — Progress Notes (Signed)
Date:  09/29/2019   Name:  Madeline Moon   DOB:  March 29, 1961   MRN:  [id number]   Chief Complaint: Hypertension  Hypertension This is a new problem. The problem has been gradually worsening since onset. Pertinent negatives include no chest pain, headaches, palpitations or shortness of breath. Agents associated with hypertension include steroids (recent steroid injection in shoulder). There are no known risk factors for coronary artery disease. Past treatments include nothing.    Lab Results  Component Value Date   CREATININE 0.88 10/06/2018   BUN 14 10/06/2018   NA 141 10/06/2018   K 4.8 10/06/2018   CL 105 10/06/2018   CO2 23 10/06/2018   Lab Results  Component Value Date   CHOL 231 (H) 10/06/2018   HDL 53 10/06/2018   LDLCALC 152 (H) 10/06/2018   TRIG 128 10/06/2018   CHOLHDL 4.4 10/06/2018   Lab Results  Component Value Date   TSH 1.980 10/06/2018   Lab Results  Component Value Date   HGBA1C 6.3 (H) 10/06/2018   Lab Results  Component Value Date   WBC 4.5 10/06/2018   HGB 11.3 10/06/2018   HCT 36.0 10/06/2018   MCV 80 10/06/2018   PLT 357 10/06/2018   Lab Results  Component Value Date   ALT 18 10/06/2018   AST 19 10/06/2018   ALKPHOS 99 10/06/2018   BILITOT 0.3 10/06/2018     Review of Systems  Constitutional: Positive for diaphoresis (face felt flushed). Negative for chills, fatigue, fever and unexpected weight change.  Respiratory: Negative for cough, chest tightness, shortness of breath and wheezing.   Cardiovascular: Negative for chest pain, palpitations and leg swelling.  Neurological: Negative for dizziness, light-headedness and headaches.    Patient Active Problem List   Diagnosis Date Noted  . Gastroesophageal reflux disease without esophagitis 10/06/2018  . Palmar fascial fibromatosis 10/06/2018  . Hyperlipidemia, mild 08/12/2016  . Back muscle spasm 12/16/2014  . Mixed urge and stress incontinence 11/09/2014  . Osteoarthritis  11/09/2014    No Known Allergies  Past Surgical History:  Procedure Laterality Date  . RIGHT OOPHORECTOMY    . TUBAL LIGATION      Social History   Tobacco Use  . Smoking status: Never Smoker  . Smokeless tobacco: Never Used  Substance Use Topics  . Alcohol use: Yes    Alcohol/week: 2.0 standard drinks    Types: 2 Standard drinks or equivalent per week  . Drug use: No     Medication list has been reviewed and updated.  Current Meds  Medication Sig  . calcium-vitamin D (OSCAL WITH D) 500-200 MG-UNIT tablet Take 1 tablet by mouth.  . co-enzyme Q-10 30 MG capsule Take 30 mg by mouth 3 (three) times daily.  . diphenhydrAMINE (BENADRYL) 25 mg capsule Take 25 mg by mouth every 6 (six) hours as needed.  . ferrous sulfate 325 (65 FE) MG EC tablet Take 325 mg by mouth 3 (three) times daily with meals.  . meloxicam (MOBIC) 15 MG tablet Take 15 mg by mouth daily.  . methocarbamol (ROBAXIN) 500 MG tablet Take 1 tablet (500 mg total) by mouth every 8 (eight) hours as needed for muscle spasms.  . Multiple Vitamin (MULTIVITAMIN) capsule Take 1 capsule by mouth daily.  Marland Kitchen omeprazole (PRILOSEC) 20 MG capsule TAKE 1 CAPSULE DAILY  . oxybutynin (DITROPAN XL) 10 MG 24 hr tablet Take 1 tablet (10 mg total) by mouth at bedtime.  Marland Kitchen zinc gluconate 50 MG tablet Take  50 mg by mouth daily.    PHQ 2/9 Scores 09/29/2019 10/06/2018 08/21/2017  PHQ - 2 Score 0 1 0  PHQ- 9 Score 0 - 0    BP Readings from Last 3 Encounters:  09/29/19 (!) 148/82  10/06/18 122/70  08/21/17 124/68    Physical Exam Vitals and nursing note reviewed.  Constitutional:      General: She is not in acute distress.    Appearance: Normal appearance. She is well-developed.  HENT:     Head: Normocephalic and atraumatic.  Cardiovascular:     Rate and Rhythm: Normal rate and regular rhythm.     Pulses: Normal pulses.     Heart sounds: No murmur.  Pulmonary:     Effort: Pulmonary effort is normal. No respiratory distress.      Breath sounds: No wheezing or rhonchi.  Musculoskeletal:        General: Normal range of motion.     Cervical back: Normal range of motion.     Right lower leg: No edema.     Left lower leg: No edema.  Skin:    General: Skin is warm and dry.     Capillary Refill: Capillary refill takes less than 2 seconds.     Findings: No rash.  Neurological:     General: No focal deficit present.     Mental Status: She is alert and oriented to person, place, and time.  Psychiatric:        Mood and Affect: Mood normal.     Wt Readings from Last 3 Encounters:  09/29/19 200 lb (90.7 kg)  10/06/18 209 lb 9.6 oz (95.1 kg)  08/21/17 212 lb (96.2 kg)    BP (!) 148/82 Comment: Patient brought her wrist cuff from home. BP: 170/88  Pulse (!) 59   Temp 98.7 F (37.1 C) (Temporal)   Ht 5\' 5"  (1.651 m)   Wt 200 lb (90.7 kg)   SpO2 98%   BMI 33.28 kg/m   Assessment and Plan: 1. Elevated blood pressure reading Suspect this may be partly due to recent steroid injection Begin diuretic and recheck at visit next week - triamterene-hydrochlorothiazide (MAXZIDE-25) 37.5-25 MG tablet; Take 1 tablet by mouth daily.  Dispense: 30 tablet; Refill: 0   Partially dictated using 03-09-1992. Any errors are unintentional.  Animal nutritionist, MD [facility]  09/29/2019

## 2019-10-06 ENCOUNTER — Encounter: Payer: BLUE CROSS/BLUE SHIELD | Admitting: Internal Medicine

## 2019-10-07 ENCOUNTER — Ambulatory Visit (INDEPENDENT_AMBULATORY_CARE_PROVIDER_SITE_OTHER): Payer: 59 | Admitting: Internal Medicine

## 2019-10-07 ENCOUNTER — Encounter: Payer: Self-pay | Admitting: Internal Medicine

## 2019-10-07 ENCOUNTER — Other Ambulatory Visit: Payer: Self-pay

## 2019-10-07 VITALS — BP 128/82 | HR 57 | Temp 97.4°F | Ht 65.0 in | Wt 194.0 lb

## 2019-10-07 DIAGNOSIS — R030 Elevated blood-pressure reading, without diagnosis of hypertension: Secondary | ICD-10-CM | POA: Insufficient documentation

## 2019-10-07 DIAGNOSIS — Z1231 Encounter for screening mammogram for malignant neoplasm of breast: Secondary | ICD-10-CM

## 2019-10-07 DIAGNOSIS — I10 Essential (primary) hypertension: Secondary | ICD-10-CM

## 2019-10-07 DIAGNOSIS — K219 Gastro-esophageal reflux disease without esophagitis: Secondary | ICD-10-CM | POA: Diagnosis not present

## 2019-10-07 DIAGNOSIS — N3281 Overactive bladder: Secondary | ICD-10-CM | POA: Diagnosis not present

## 2019-10-07 DIAGNOSIS — Z0000 Encounter for general adult medical examination without abnormal findings: Secondary | ICD-10-CM

## 2019-10-07 LAB — POCT URINALYSIS DIPSTICK
Bilirubin, UA: NEGATIVE
Glucose, UA: NEGATIVE
Ketones, UA: NEGATIVE
Leukocytes, UA: NEGATIVE
Nitrite, UA: NEGATIVE
Protein, UA: NEGATIVE
Spec Grav, UA: 1.015 (ref 1.010–1.025)
Urobilinogen, UA: 0.2 E.U./dL
pH, UA: 5 (ref 5.0–8.0)

## 2019-10-07 MED ORDER — OXYBUTYNIN CHLORIDE ER 10 MG PO TB24: 10.0000 mg | ORAL_TABLET | Freq: Every day | ORAL | 5 refills | Status: DC

## 2019-10-07 NOTE — Progress Notes (Signed)
Date:  10/07/2019   Name:  Madeline Moon   DOB:  May 21, 1961   MRN:  [id number]   Chief Complaint: Annual Exam (Breast Exam. No pap- 4 more years.) Madeline Moon is a 59 y.o. female who presents today for her Complete Annual Exam. She feels well. She reports exercising regularly. She reports she is sleeping fairly well. She denies breast issues.  Mammogram  11/2018 @ [facility] Pap  10/2018 Colonoscopy  09/2011 Immunization History  Administered Date(s) Administered  . Influenza,inj,Quad PF,6+ Mos 04/05/2016  . Tdap 08/10/2015  . Zoster Recombinat (Shingrix) 10/06/2018, 01/14/2019    Gastroesophageal Reflux She complains of heartburn. She reports no abdominal pain, no chest pain, no coughing or no wheezing. This is a recurrent problem. Pertinent negatives include no fatigue. She has tried a PPI for the symptoms. The treatment provided significant relief.  Hypertension This is a new problem. The problem has been gradually improving since onset. The problem is controlled. Pertinent negatives include no chest pain, headaches, palpitations or shortness of breath. Past treatments include diuretics. The current treatment provides moderate improvement.    Lab Results  Component Value Date   CREATININE 0.88 10/06/2018   BUN 14 10/06/2018   NA 141 10/06/2018   K 4.8 10/06/2018   CL 105 10/06/2018   CO2 23 10/06/2018   Lab Results  Component Value Date   CHOL 231 (H) 10/06/2018   HDL 53 10/06/2018   LDLCALC 152 (H) 10/06/2018   TRIG 128 10/06/2018   CHOLHDL 4.4 10/06/2018   Lab Results  Component Value Date   TSH 1.980 10/06/2018   Lab Results  Component Value Date   HGBA1C 6.3 (H) 10/06/2018   Lab Results  Component Value Date   WBC 4.5 10/06/2018   HGB 11.3 10/06/2018   HCT 36.0 10/06/2018   MCV 80 10/06/2018   PLT 357 10/06/2018   Lab Results  Component Value Date   ALT 18 10/06/2018   AST 19 10/06/2018   ALKPHOS 99 10/06/2018   BILITOT 0.3 10/06/2018      Review of Systems  Constitutional: Positive for unexpected weight change. Negative for chills, fatigue and fever.  HENT: Negative for congestion, hearing loss, tinnitus, trouble swallowing and voice change.   Eyes: Negative for visual disturbance.  Respiratory: Negative for cough, chest tightness, shortness of breath and wheezing.   Cardiovascular: Negative for chest pain, palpitations and leg swelling.  Gastrointestinal: Positive for heartburn. Negative for abdominal pain, constipation, diarrhea and vomiting.  Endocrine: Negative for polydipsia and polyuria.  Genitourinary: Negative for dysuria, frequency, genital sores, vaginal bleeding and vaginal discharge.  Musculoskeletal: Positive for myalgias. Negative for arthralgias, gait problem and joint swelling.  Skin: Negative for color change and rash.  Neurological: Negative for dizziness, tremors, light-headedness and headaches.  Hematological: Negative for adenopathy. Does not bruise/bleed easily.  Psychiatric/Behavioral: Negative for dysphoric mood and sleep disturbance. The patient is not nervous/anxious.     Patient Active Problem List   Diagnosis Date Noted  . Gastroesophageal reflux disease without esophagitis 10/06/2018  . Palmar fascial fibromatosis 10/06/2018  . Hyperlipidemia, mild 08/12/2016  . Back muscle spasm 12/16/2014  . Mixed urge and stress incontinence 11/09/2014  . Osteoarthritis 11/09/2014    No Known Allergies  Past Surgical History:  Procedure Laterality Date  . RIGHT OOPHORECTOMY    . TUBAL LIGATION      Social History   Tobacco Use  . Smoking status: Never Smoker  . Smokeless tobacco: Never Used  Substance Use Topics  . Alcohol use: Yes    Alcohol/week: 2.0 standard drinks    Types: 2 Standard drinks or equivalent per week  . Drug use: No     Medication list has been reviewed and updated.  Current Meds  Medication Sig  . calcium-vitamin D (OSCAL WITH D) 500-200 MG-UNIT tablet Take 1  tablet by mouth.  . co-enzyme Q-10 30 MG capsule Take 30 mg by mouth 3 (three) times daily.  . diphenhydrAMINE (BENADRYL) 25 mg capsule Take 25 mg by mouth every 6 (six) hours as needed.  . ferrous sulfate 325 (65 FE) MG EC tablet Take 325 mg by mouth daily with breakfast.   . meloxicam (MOBIC) 15 MG tablet Take 15 mg by mouth daily.  . methocarbamol (ROBAXIN) 500 MG tablet Take 1 tablet (500 mg total) by mouth every 8 (eight) hours as needed for muscle spasms.  . Multiple Vitamin (MULTIVITAMIN) capsule Take 1 capsule by mouth daily.  Marland Kitchen omeprazole (PRILOSEC) 20 MG capsule TAKE 1 CAPSULE DAILY  . oxybutynin (DITROPAN XL) 10 MG 24 hr tablet Take 1 tablet (10 mg total) by mouth at bedtime.  . triamterene-hydrochlorothiazide (MAXZIDE-25) 37.5-25 MG tablet Take 1 tablet by mouth daily.    PHQ 2/9 Scores 10/07/2019 09/29/2019 10/06/2018 08/21/2017  PHQ - 2 Score 0 0 1 0  PHQ- 9 Score 0 0 - 0    BP Readings from Last 3 Encounters:  10/07/19 128/82  09/29/19 (!) 148/82  10/06/18 122/70    Physical Exam Vitals and nursing note reviewed.  Constitutional:      General: She is not in acute distress.    Appearance: She is well-developed.  HENT:     Head: Normocephalic and atraumatic.     Right Ear: Tympanic membrane and ear canal normal.     Left Ear: Tympanic membrane and ear canal normal.     Nose:     Right Sinus: No maxillary sinus tenderness.     Left Sinus: No maxillary sinus tenderness.  Eyes:     General: No scleral icterus.       Right eye: No discharge.        Left eye: No discharge.     Conjunctiva/sclera: Conjunctivae normal.  Neck:     Thyroid: No thyromegaly.     Vascular: No carotid bruit.  Cardiovascular:     Rate and Rhythm: Normal rate and regular rhythm.     Pulses: Normal pulses.     Heart sounds: Normal heart sounds.  Pulmonary:     Effort: Pulmonary effort is normal. No respiratory distress.     Breath sounds: No wheezing.  Chest:     Breasts:        Right: No  mass, nipple discharge, skin change or tenderness.        Left: No mass, nipple discharge, skin change or tenderness.  Abdominal:     General: Bowel sounds are normal.     Palpations: Abdomen is soft.     Tenderness: There is no abdominal tenderness.  Musculoskeletal:        General: Normal range of motion.     Cervical back: Normal range of motion. No erythema.     Right lower leg: No edema.     Left lower leg: No edema.  Lymphadenopathy:     Cervical: No cervical adenopathy.  Skin:    General: Skin is warm and dry.     Capillary Refill: Capillary refill takes less than 2 seconds.  Findings: No rash.  Neurological:     General: No focal deficit present.     Mental Status: She is alert and oriented to person, place, and time.     Cranial Nerves: No cranial nerve deficit.     Sensory: No sensory deficit.     Deep Tendon Reflexes: Reflexes are normal and symmetric.  Psychiatric:        Speech: Speech normal.        Behavior: Behavior normal.        Thought Content: Thought content normal.     Wt Readings from Last 3 Encounters:  10/07/19 194 lb (88 kg)  09/29/19 200 lb (90.7 kg)  10/06/18 209 lb 9.6 oz (95.1 kg)    BP 128/82   Pulse (!) 57   Temp (!) 97.4 F (36.3 C) (Temporal)   Ht 5\' 5"  (1.651 m)   Wt 194 lb (88 kg)   SpO2 99%   BMI 32.28 kg/m   Assessment and Plan: 1. Annual physical exam Normal exam Continue healthy diet, exercise - Lipid panel - POCT urinalysis dipstick  2. Encounter for screening mammogram for breast cancer To be scheduled at [facility] - MM 3D SCREEN BREAST BILATERAL; Future  3. Gastroesophageal reflux disease without esophagitis Symptoms well controlled on daily PPI No red flag signs such as weight loss, n/v, melena Will continue omeprazole otc. - CBC with Differential/Platelet  4. Essential hypertension Clinically stable exam with well controlled BP on hctz. Tolerating medications with occasional muscle cramps. Try reducing dose  to every other day; magnesium/potassium supplement Monitor BP Pt to continue current regimen and low sodium diet; benefits of regular exercise as able discussed. - Comprehensive metabolic panel - TSH  5. OAB (overactive bladder) Medication may not be covered  - oxybutynin (DITROPAN XL) 10 MG 24 hr tablet; Take 1 tablet (10 mg total) by mouth at bedtime.  Dispense: 30 tablet; Refill: 5   Partially dictated using [facility]. Any errors are unintentional.  Animal nutritionist, MD [facility]  10/07/2019

## 2019-10-08 ENCOUNTER — Other Ambulatory Visit: Payer: Self-pay | Admitting: Internal Medicine

## 2019-10-08 LAB — COMPREHENSIVE METABOLIC PANEL
ALT: 15 IU/L (ref 0–32)
AST: 15 IU/L (ref 0–40)
Albumin/Globulin Ratio: 1.7 (ref 1.2–2.2)
Albumin: 4.3 g/dL (ref 3.8–4.9)
Alkaline Phosphatase: 93 IU/L (ref 39–117)
BUN/Creatinine Ratio: 27 — ABNORMAL HIGH (ref 9–23)
BUN: 33 mg/dL — ABNORMAL HIGH (ref 6–24)
Bilirubin Total: 0.6 mg/dL (ref 0.0–1.2)
CO2: 22 mmol/L (ref 20–29)
Calcium: 9.2 mg/dL (ref 8.7–10.2)
Chloride: 104 mmol/L (ref 96–106)
Creatinine, Ser: 1.23 mg/dL — ABNORMAL HIGH (ref 0.57–1.00)
GFR calc Af Amer: 55 mL/min/{1.73_m2} — ABNORMAL LOW (ref >59)
GFR calc non Af Amer: 48 mL/min/{1.73_m2} — ABNORMAL LOW (ref >59)
Globulin, Total: 2.5 g/dL (ref 1.5–4.5)
Glucose: 101 mg/dL — ABNORMAL HIGH (ref 65–99)
Potassium: 5 mmol/L (ref 3.5–5.2)
Sodium: 141 mmol/L (ref 134–144)
Total Protein: 6.8 g/dL (ref 6.0–8.5)

## 2019-10-08 LAB — CBC WITH DIFFERENTIAL/PLATELET
Basophils Absolute: 0 10*3/uL (ref 0.0–0.2)
Basos: 1 %
EOS (ABSOLUTE): 0.1 10*3/uL (ref 0.0–0.4)
Eos: 2 %
Hematocrit: 44.3 % (ref 34.0–46.6)
Hemoglobin: 14.2 g/dL (ref 11.1–15.9)
Immature Grans (Abs): 0 10*3/uL (ref 0.0–0.1)
Immature Granulocytes: 0 %
Lymphocytes Absolute: 1.4 10*3/uL (ref 0.7–3.1)
Lymphs: 21 %
MCH: 26.9 pg (ref 26.6–33.0)
MCHC: 32.1 g/dL (ref 31.5–35.7)
MCV: 84 fL (ref 79–97)
Monocytes Absolute: 0.5 10*3/uL (ref 0.1–0.9)
Monocytes: 7 %
Neutrophils Absolute: 4.6 10*3/uL (ref 1.4–7.0)
Neutrophils: 69 %
Platelets: 334 10*3/uL (ref 150–450)
RBC: 5.27 x10E6/uL (ref 3.77–5.28)
RDW: 16.2 % — ABNORMAL HIGH (ref 11.7–15.4)
WBC: 6.6 10*3/uL (ref 3.4–10.8)

## 2019-10-08 LAB — LIPID PANEL
Chol/HDL Ratio: 3.5 ratio (ref 0.0–4.4)
Cholesterol, Total: 205 mg/dL — ABNORMAL HIGH (ref 100–199)
HDL: 58 mg/dL (ref >39)
LDL Chol Calc (NIH): 133 mg/dL — ABNORMAL HIGH (ref 0–99)
Triglycerides: 78 mg/dL (ref 0–149)
VLDL Cholesterol Cal: 14 mg/dL (ref 5–40)

## 2019-10-08 LAB — TSH: TSH: 1.49 u[IU]/mL (ref 0.450–4.500)

## 2019-10-21 ENCOUNTER — Other Ambulatory Visit: Payer: Self-pay | Admitting: Internal Medicine

## 2019-10-21 DIAGNOSIS — R030 Elevated blood-pressure reading, without diagnosis of hypertension: Secondary | ICD-10-CM

## 2019-12-09 ENCOUNTER — Encounter: Payer: Self-pay | Admitting: Internal Medicine

## 2019-12-09 ENCOUNTER — Ambulatory Visit (INDEPENDENT_AMBULATORY_CARE_PROVIDER_SITE_OTHER): Payer: 59 | Admitting: Internal Medicine

## 2019-12-09 ENCOUNTER — Other Ambulatory Visit: Payer: Self-pay

## 2019-12-09 VITALS — BP 112/80 | HR 71 | Temp 98.0°F | Ht 65.0 in | Wt 198.0 lb

## 2019-12-09 DIAGNOSIS — N289 Disorder of kidney and ureter, unspecified: Secondary | ICD-10-CM | POA: Diagnosis not present

## 2019-12-09 DIAGNOSIS — I10 Essential (primary) hypertension: Secondary | ICD-10-CM | POA: Diagnosis not present

## 2019-12-09 NOTE — Progress Notes (Signed)
Date:  12/09/2019   Name:  Madeline Moon   DOB:  1961/01/12   MRN:  [id number]   Chief Complaint: Hypertension (bp check- scehdule mam)  Hypertension This is a chronic problem. The problem is controlled. Pertinent negatives include no chest pain, headaches, palpitations or shortness of breath. Past treatments include diuretics. The current treatment provides significant improvement. Compliance problems include medication side effects (having muscle cramps last visit and Cr elevated so maxzide stopped).     Lab Results  Component Value Date   CREATININE 1.23 (H) 10/07/2019   BUN 33 (H) 10/07/2019   NA 141 10/07/2019   K 5.0 10/07/2019   CL 104 10/07/2019   CO2 22 10/07/2019   Lab Results  Component Value Date   CHOL 205 (H) 10/07/2019   HDL 58 10/07/2019   LDLCALC 133 (H) 10/07/2019   TRIG 78 10/07/2019   CHOLHDL 3.5 10/07/2019   Lab Results  Component Value Date   TSH 1.490 10/07/2019   Lab Results  Component Value Date   HGBA1C 6.3 (H) 10/06/2018   Lab Results  Component Value Date   WBC 6.6 10/07/2019   HGB 14.2 10/07/2019   HCT 44.3 10/07/2019   MCV 84 10/07/2019   PLT 334 10/07/2019   Lab Results  Component Value Date   ALT 15 10/07/2019   AST 15 10/07/2019   ALKPHOS 93 10/07/2019   BILITOT 0.6 10/07/2019     Review of Systems  Constitutional: Negative for appetite change, fatigue, fever and unexpected weight change.  HENT: Negative for tinnitus and trouble swallowing.   Eyes: Negative for visual disturbance.  Respiratory: Negative for cough, chest tightness and shortness of breath.   Cardiovascular: Negative for chest pain, palpitations and leg swelling.  Gastrointestinal: Negative for abdominal pain.  Genitourinary: Negative for dysuria and hematuria.  Musculoskeletal: Negative for arthralgias.  Neurological: Negative for tremors, numbness and headaches.  Psychiatric/Behavioral: Negative for dysphoric mood.    Patient Active Problem  List   Diagnosis Date Noted  . Essential hypertension 10/07/2019  . Gastroesophageal reflux disease without esophagitis 10/06/2018  . Palmar fascial fibromatosis 10/06/2018  . Hyperlipidemia, mild 08/12/2016  . Back muscle spasm 12/16/2014  . Mixed urge and stress incontinence 11/09/2014  . Osteoarthritis 11/09/2014    No Known Allergies  Past Surgical History:  Procedure Laterality Date  . RIGHT OOPHORECTOMY    . TUBAL LIGATION      Social History   Tobacco Use  . Smoking status: Never Smoker  . Smokeless tobacco: Never Used  Vaping Use  . Vaping Use: Never used  Substance Use Topics  . Alcohol use: Yes    Alcohol/week: 2.0 standard drinks    Types: 2 Standard drinks or equivalent per week  . Drug use: No     Medication list has been reviewed and updated.  Current Meds  Medication Sig  . calcium-vitamin D (OSCAL WITH D) 500-200 MG-UNIT tablet Take 1 tablet by mouth.  . co-enzyme Q-10 30 MG capsule Take 30 mg by mouth daily.   . cyclobenzaprine (FLEXERIL) 10 MG tablet cyclobenzaprine 10 mg tablet  . diphenhydrAMINE (BENADRYL) 25 mg capsule Take 25 mg by mouth every 6 (six) hours as needed.  . ferrous sulfate 325 (65 FE) MG EC tablet Take 325 mg by mouth daily with breakfast.   . meloxicam (MOBIC) 15 MG tablet Take 15 mg by mouth daily.  . Multiple Vitamin (MULTIVITAMIN) capsule Take 1 capsule by mouth daily.  Marland Kitchen omeprazole (PRILOSEC)  20 MG capsule TAKE 1 CAPSULE DAILY  . oxybutynin (DITROPAN XL) 10 MG 24 hr tablet Take 1 tablet (10 mg total) by mouth at bedtime. (Patient taking differently: Take 10 mg by mouth at bedtime. )    PHQ 2/9 Scores 12/09/2019 10/07/2019 09/29/2019 10/06/2018  PHQ - 2 Score 0 0 0 1  PHQ- 9 Score 0 0 0 -    GAD 7 : Generalized Anxiety Score 12/09/2019 10/07/2019  Nervous, Anxious, on Edge 0 0  Control/stop worrying 0 0  Worry too much - different things 0 0  Trouble relaxing 0 0  Restless 0 0  Easily annoyed or irritable 0 0  Afraid - awful  might happen 0 0  Total GAD 7 Score 0 0  Anxiety Difficulty Not difficult at all Not difficult at all    BP Readings from Last 3 Encounters:  12/09/19 112/80  10/07/19 128/82  09/29/19 (!) 148/82    Physical Exam Vitals and nursing note reviewed.  Constitutional:      General: She is not in acute distress.    Appearance: Normal appearance. She is well-developed.  HENT:     Head: Normocephalic and atraumatic.  Cardiovascular:     Rate and Rhythm: Normal rate and regular rhythm.  Pulmonary:     Effort: Pulmonary effort is normal. No respiratory distress.     Breath sounds: No wheezing or rhonchi.  Musculoskeletal:     Cervical back: Normal range of motion.     Right lower leg: No edema.     Left lower leg: No edema.  Lymphadenopathy:     Cervical: No cervical adenopathy.  Skin:    General: Skin is warm and dry.     Findings: No rash.  Neurological:     General: No focal deficit present.     Mental Status: She is alert and oriented to person, place, and time.  Psychiatric:        Mood and Affect: Mood normal.     Wt Readings from Last 3 Encounters:  12/09/19 198 lb (89.8 kg)  10/07/19 194 lb (88 kg)  09/29/19 200 lb (90.7 kg)    BP 112/80   Pulse 71   Temp 98 F (36.7 C) (Oral)   Ht 5\' 5"  (1.651 m)   Wt 198 lb (89.8 kg)   SpO2 98%   BMI 32.95 kg/m   Assessment and Plan: 1. Essential hypertension Normal BPs off of medication Will continue to monitor at home and call or return if abnormal  2. Decreased renal function Noted at last visit - will repeat now off of diuretic - Basic metabolic panel   Partially dictated using Dragon software. Any errors are unintentional.  , MD [facility]  12/09/2019

## 2019-12-14 LAB — BASIC METABOLIC PANEL
BUN/Creatinine Ratio: 23 (ref 12–28)
BUN: 19 mg/dL (ref 8–27)
CO2: 24 mmol/L (ref 20–29)
Calcium: 9 mg/dL (ref 8.7–10.3)
Chloride: 104 mmol/L (ref 96–106)
Creatinine, Ser: 0.83 mg/dL (ref 0.57–1.00)
GFR calc Af Amer: 89 mL/min/{1.73_m2} (ref >59)
GFR calc non Af Amer: 77 mL/min/{1.73_m2} (ref >59)
Glucose: 122 mg/dL — ABNORMAL HIGH (ref 65–99)
Potassium: 4.5 mmol/L (ref 3.5–5.2)
Sodium: 143 mmol/L (ref 134–144)

## 2020-01-19 ENCOUNTER — Telehealth: Payer: Self-pay | Admitting: Internal Medicine

## 2020-01-19 NOTE — Telephone Encounter (Unsigned)
Copied from CRM [id number]. Topic: General - Inquiry >> Jan 19, 2020 10:05 AM Madeline Moon wrote: Reason for CRM: pt called in and stated Dr berglund know what is going on with her and her husband.  She wants to know if Dr can call something in for her for mild depression just to help her make it though these times  Best number [phone] Pharmacy - [facility] in [place]

## 2020-01-20 ENCOUNTER — Ambulatory Visit: Payer: 59 | Admitting: Internal Medicine

## 2020-01-21 ENCOUNTER — Ambulatory Visit (INDEPENDENT_AMBULATORY_CARE_PROVIDER_SITE_OTHER): Payer: 59 | Admitting: Internal Medicine

## 2020-01-21 ENCOUNTER — Encounter: Payer: Self-pay | Admitting: Internal Medicine

## 2020-01-21 ENCOUNTER — Other Ambulatory Visit: Payer: Self-pay

## 2020-01-21 VITALS — BP 118/78 | HR 82 | Temp 97.9°F | Ht 65.0 in | Wt 195.0 lb

## 2020-01-21 DIAGNOSIS — F325 Major depressive disorder, single episode, in full remission: Secondary | ICD-10-CM | POA: Insufficient documentation

## 2020-01-21 DIAGNOSIS — F39 Unspecified mood [affective] disorder: Secondary | ICD-10-CM

## 2020-01-21 MED ORDER — ESCITALOPRAM OXALATE 10 MG PO TABS: 10.0000 mg | ORAL_TABLET | Freq: Every day | ORAL | 2 refills | Status: DC

## 2020-01-21 NOTE — Patient Instructions (Signed)
Take 1/2 tablet of lexapro 10 mg daily

## 2020-01-21 NOTE — Progress Notes (Signed)
Date:  01/21/2020   Name:  Madeline Moon   DOB:  1960/08/19   MRN:  [id number]   Chief Complaint: Depression (PHQ- 13 GAD-6, X1-2 weeks, feels bummed out, husband has OCD and its starting to bother her more, )  Depression        This is a new problem.  The onset quality is gradual. The problem is unchanged.  Associated symptoms include irritable.  Associated symptoms include no fatigue, no headaches and no suicidal ideas.     The symptoms are aggravated by family issues.  Past treatments include nothing.   Lab Results  Component Value Date   CREATININE 0.83 12/13/2019   BUN 19 12/13/2019   NA 143 12/13/2019   K 4.5 12/13/2019   CL 104 12/13/2019   CO2 24 12/13/2019   Lab Results  Component Value Date   CHOL 205 (H) 10/07/2019   HDL 58 10/07/2019   LDLCALC 133 (H) 10/07/2019   TRIG 78 10/07/2019   CHOLHDL 3.5 10/07/2019   Lab Results  Component Value Date   TSH 1.490 10/07/2019   Lab Results  Component Value Date   HGBA1C 6.3 (H) 10/06/2018   Lab Results  Component Value Date   WBC 6.6 10/07/2019   HGB 14.2 10/07/2019   HCT 44.3 10/07/2019   MCV 84 10/07/2019   PLT 334 10/07/2019   Lab Results  Component Value Date   ALT 15 10/07/2019   AST 15 10/07/2019   ALKPHOS 93 10/07/2019   BILITOT 0.6 10/07/2019     Review of Systems  Constitutional: Negative for chills, fatigue and fever.  Respiratory: Negative for chest tightness and shortness of breath.   Cardiovascular: Negative for chest pain and palpitations.  Neurological: Negative for dizziness and headaches.  Psychiatric/Behavioral: Positive for depression. Negative for dysphoric mood and suicidal ideas. The patient is not nervous/anxious.     Patient Active Problem List   Diagnosis Date Noted  . Essential hypertension 10/07/2019  . Gastroesophageal reflux disease without esophagitis 10/06/2018  . Palmar fascial fibromatosis 10/06/2018  . Hyperlipidemia, mild 08/12/2016  . Back muscle spasm  12/16/2014  . Mixed urge and stress incontinence 11/09/2014  . Osteoarthritis 11/09/2014    No Known Allergies  Past Surgical History:  Procedure Laterality Date  . RIGHT OOPHORECTOMY    . TUBAL LIGATION      Social History   Tobacco Use  . Smoking status: Never Smoker  . Smokeless tobacco: Never Used  Vaping Use  . Vaping Use: Never used  Substance Use Topics  . Alcohol use: Yes    Alcohol/week: 2.0 standard drinks    Types: 2 Standard drinks or equivalent per week  . Drug use: No     Medication list has been reviewed and updated.  Current Meds  Medication Sig  . calcium-vitamin D (OSCAL WITH D) 500-200 MG-UNIT tablet Take 1 tablet by mouth.  . co-enzyme Q-10 30 MG capsule Take 30 mg by mouth daily.   . ferrous sulfate 325 (65 FE) MG EC tablet Take 325 mg by mouth daily with breakfast.   . meloxicam (MOBIC) 15 MG tablet Take 15 mg by mouth daily.  . Multiple Vitamin (MULTIVITAMIN) capsule Take 1 capsule by mouth daily.  Marland Kitchen omeprazole (PRILOSEC) 20 MG capsule Take 20 mg by mouth daily.  Marland Kitchen oxybutynin (DITROPAN XL) 10 MG 24 hr tablet Take 1 tablet (10 mg total) by mouth at bedtime. (Patient taking differently: Take 10 mg by mouth at bedtime. )  . [  DISCONTINUED] omeprazole (PRILOSEC) 20 MG capsule TAKE 1 CAPSULE DAILY  . [DISCONTINUED] oxybutynin (DITROPAN-XL) 10 MG 24 hr tablet Take by mouth.    PHQ 2/9 Scores 01/21/2020 12/09/2019 10/07/2019 09/29/2019  PHQ - 2 Score 4 0 0 0  PHQ- 9 Score 13 0 0 0    GAD 7 : Generalized Anxiety Score 01/21/2020 12/09/2019 10/07/2019  Nervous, Anxious, on Edge 1 0 0  Control/stop worrying 1 0 0  Worry too much - different things 1 0 0  Trouble relaxing 0 0 0  Restless 0 0 0  Easily annoyed or irritable 1 0 0  Afraid - awful might happen 2 0 0  Total GAD 7 Score 6 0 0  Anxiety Difficulty Not difficult at all Not difficult at all Not difficult at all    BP Readings from Last 3 Encounters:  01/21/20 118/78  12/09/19 112/80  10/07/19  128/82    Physical Exam Constitutional:      General: She is irritable.     Appearance: Normal appearance.  Cardiovascular:     Rate and Rhythm: Normal rate and regular rhythm.     Pulses: Normal pulses.  Pulmonary:     Effort: Pulmonary effort is normal.     Breath sounds: Normal breath sounds.  Musculoskeletal:     Right lower leg: No edema.     Left lower leg: No edema.  Lymphadenopathy:     Cervical: No cervical adenopathy.  Neurological:     General: No focal deficit present.     Mental Status: She is alert.     Wt Readings from Last 3 Encounters:  01/21/20 195 lb (88.5 kg)  12/09/19 198 lb (89.8 kg)  10/07/19 194 lb (88 kg)    BP 118/78   Pulse 82   Temp 97.9 F (36.6 C) (Oral)   Ht 5\' 5"  (1.651 m)   Wt 195 lb (88.5 kg)   SpO2 98%   BMI 32.45 kg/m   Assessment and Plan: 1. Mood disorder (HCC) Recommend low dose lexapro 5 mg per day Follow up with me virtual visit in 4-5 weeks Call if side effects or new issues - escitalopram (LEXAPRO) 10 MG tablet; Take 1 tablet (10 mg total) by mouth daily.  Dispense: 30 tablet; Refill: 2   Partially dictated using . Any errors are unintentional.  Animal nutritionist, MD [facility]  01/21/2020

## 2020-02-28 ENCOUNTER — Ambulatory Visit (INDEPENDENT_AMBULATORY_CARE_PROVIDER_SITE_OTHER): Payer: 59 | Admitting: Internal Medicine

## 2020-02-28 ENCOUNTER — Encounter: Payer: Self-pay | Admitting: Internal Medicine

## 2020-02-28 ENCOUNTER — Other Ambulatory Visit: Payer: Self-pay

## 2020-02-28 VITALS — BP 118/74 | HR 71 | Temp 97.5°F | Ht 65.0 in | Wt 198.0 lb

## 2020-02-28 DIAGNOSIS — N3281 Overactive bladder: Secondary | ICD-10-CM | POA: Diagnosis not present

## 2020-02-28 DIAGNOSIS — M25511 Pain in right shoulder: Secondary | ICD-10-CM

## 2020-02-28 DIAGNOSIS — G8929 Other chronic pain: Secondary | ICD-10-CM

## 2020-02-28 DIAGNOSIS — F39 Unspecified mood [affective] disorder: Secondary | ICD-10-CM | POA: Diagnosis not present

## 2020-02-28 MED ORDER — OXYBUTYNIN CHLORIDE ER 5 MG PO TB24: 5.0000 mg | ORAL_TABLET | Freq: Every day | ORAL | 0 refills | Status: DC

## 2020-02-28 MED ORDER — MELOXICAM 15 MG PO TABS: 15.0000 mg | ORAL_TABLET | Freq: Every day | ORAL | 1 refills | Status: DC

## 2020-02-28 NOTE — Progress Notes (Signed)
Date:  02/28/2020   Name:  Madeline Moon   DOB:  1960-11-08   MRN:  [id number]   Chief Complaint: Depression (follow up )  Depression        This is a new (follow up from last month) problem.  The current episode started more than 1 month ago.   Associated symptoms include irritable.  Associated symptoms include no headaches and no suicidal ideas.  Past treatments include SSRIs - Selective serotonin reuptake inhibitors (started lexapro 5 mg).  Compliance with treatment is good. Shoulder Pain  The pain is present in the right shoulder. This is a chronic problem. The problem occurs daily. The problem has been unchanged. The pain is moderate. Associated symptoms include a limited range of motion and stiffness. Pertinent negatives include no fever. She has tried NSAIDS for the symptoms. The treatment provided moderate relief.  OAB - ditropan 10 mg is too strong - mouth is very dry and throat become sore.  She would like to try at lower dose.  Lab Results  Component Value Date   CREATININE 0.83 12/13/2019   BUN 19 12/13/2019   NA 143 12/13/2019   K 4.5 12/13/2019   CL 104 12/13/2019   CO2 24 12/13/2019   Lab Results  Component Value Date   CHOL 205 (H) 10/07/2019   HDL 58 10/07/2019   LDLCALC 133 (H) 10/07/2019   TRIG 78 10/07/2019   CHOLHDL 3.5 10/07/2019   Lab Results  Component Value Date   TSH 1.490 10/07/2019   Lab Results  Component Value Date   HGBA1C 6.3 (H) 10/06/2018   Lab Results  Component Value Date   WBC 6.6 10/07/2019   HGB 14.2 10/07/2019   HCT 44.3 10/07/2019   MCV 84 10/07/2019   PLT 334 10/07/2019   Lab Results  Component Value Date   ALT 15 10/07/2019   AST 15 10/07/2019   ALKPHOS 93 10/07/2019   BILITOT 0.6 10/07/2019     Review of Systems  Constitutional: Negative for chills, fever and unexpected weight change.  Respiratory: Negative for chest tightness and shortness of breath.   Cardiovascular: Negative for chest pain.    Gastrointestinal: Negative for abdominal pain, constipation and diarrhea.  Genitourinary: Positive for frequency and urgency.  Musculoskeletal: Positive for stiffness.  Neurological: Negative for dizziness and headaches.  Psychiatric/Behavioral: Positive for depression. Negative for dysphoric mood and suicidal ideas. The patient is not nervous/anxious.     Patient Active Problem List   Diagnosis Date Noted  . Mood disorder (HCC) 01/21/2020  . Essential hypertension 10/07/2019  . Gastroesophageal reflux disease without esophagitis 10/06/2018  . Palmar fascial fibromatosis 10/06/2018  . Hyperlipidemia, mild 08/12/2016  . Back muscle spasm 12/16/2014  . Mixed urge and stress incontinence 11/09/2014  . Osteoarthritis 11/09/2014    No Known Allergies  Past Surgical History:  Procedure Laterality Date  . RIGHT OOPHORECTOMY    . TUBAL LIGATION      Social History   Tobacco Use  . Smoking status: Never Smoker  . Smokeless tobacco: Never Used  Vaping Use  . Vaping Use: Never used  Substance Use Topics  . Alcohol use: Yes    Alcohol/week: 2.0 standard drinks    Types: 2 Standard drinks or equivalent per week  . Drug use: No     Medication list has been reviewed and updated.  Current Meds  Medication Sig  . calcium-vitamin D (OSCAL WITH D) 500-200 MG-UNIT tablet Take 1 tablet by mouth.  Marland Kitchen  co-enzyme Q-10 30 MG capsule Take 30 mg by mouth daily.   Marland Kitchen escitalopram (LEXAPRO) 10 MG tablet Take 1 tablet (10 mg total) by mouth daily.  . ferrous sulfate 325 (65 FE) MG EC tablet Take 325 mg by mouth daily with breakfast.   . meloxicam (MOBIC) 15 MG tablet Take 15 mg by mouth daily.  . Multiple Vitamin (MULTIVITAMIN) capsule Take 1 capsule by mouth daily.  Marland Kitchen omeprazole (PRILOSEC) 20 MG capsule Take 20 mg by mouth daily.  Marland Kitchen oxybutynin (DITROPAN XL) 10 MG 24 hr tablet Take 1 tablet (10 mg total) by mouth at bedtime. (Patient taking differently: Take 10 mg by mouth at bedtime. )     PHQ 2/9 Scores 02/28/2020 01/21/2020 12/09/2019 10/07/2019  PHQ - 2 Score 0 4 0 0  PHQ- 9 Score 0 13 0 0    GAD 7 : Generalized Anxiety Score 02/28/2020 01/21/2020 12/09/2019 10/07/2019  Nervous, Anxious, on Edge 0 1 0 0  Control/stop worrying 0 1 0 0  Worry too much - different things 0 1 0 0  Trouble relaxing 0 0 0 0  Restless 0 0 0 0  Easily annoyed or irritable 0 1 0 0  Afraid - awful might happen 0 2 0 0  Total GAD 7 Score 0 6 0 0  Anxiety Difficulty Not difficult at all Not difficult at all Not difficult at all Not difficult at all    BP Readings from Last 3 Encounters:  02/28/20 118/74  01/21/20 118/78  12/09/19 112/80    Physical Exam Vitals and nursing note reviewed.  Constitutional:      General: She is irritable. She is not in acute distress.    Appearance: Normal appearance. She is well-developed.  HENT:     Head: Normocephalic and atraumatic.  Cardiovascular:     Rate and Rhythm: Normal rate and regular rhythm.     Pulses: Normal pulses.  Pulmonary:     Effort: Pulmonary effort is normal. No respiratory distress.     Breath sounds: Normal breath sounds.  Musculoskeletal:     Cervical back: Normal range of motion and neck supple.  Skin:    General: Skin is warm and dry.     Findings: No rash.  Neurological:     Mental Status: She is alert and oriented to person, place, and time.  Psychiatric:        Mood and Affect: Mood normal.        Behavior: Behavior normal.     Wt Readings from Last 3 Encounters:  02/28/20 198 lb (89.8 kg)  01/21/20 195 lb (88.5 kg)  12/09/19 198 lb (89.8 kg)    BP 118/74 (BP Location: Right Arm, Patient Position: Sitting)   Pulse 71   Temp (!) 97.5 F (36.4 C) (Oral)   Ht 5\' 5"  (1.651 m)   Wt 198 lb (89.8 kg)   SpO2 98%   BMI 32.95 kg/m   Assessment and Plan: 1. Mood disorder (HCC) Much improved with Lexapro Continue current dose  2. Chronic right shoulder pain Seen by Ortho - declined to continue to fill Mobic Pt  cautioned about renal insuff. - will need to monitor twice a year - meloxicam (MOBIC) 15 MG tablet; Take 1 tablet (15 mg total) by mouth daily.  Dispense: 90 tablet; Refill: 1  3. OAB (overactive bladder) Reduce dose to 5 mg. - oxybutynin (DITROPAN XL) 5 MG 24 hr tablet; Take 1 tablet (5 mg total) by mouth at bedtime.  Dispense: 90 tablet; Refill: 0   Partially dictated using Animal nutritionist. Any errors are unintentional.  Bari Edward, MD [facility]  02/28/2020

## 2020-04-11 ENCOUNTER — Encounter: Payer: Self-pay | Admitting: Internal Medicine

## 2020-04-11 ENCOUNTER — Other Ambulatory Visit: Payer: Self-pay

## 2020-04-11 ENCOUNTER — Ambulatory Visit (INDEPENDENT_AMBULATORY_CARE_PROVIDER_SITE_OTHER): Payer: 59 | Admitting: Internal Medicine

## 2020-04-11 VITALS — BP 126/80 | HR 67 | Temp 97.6°F | Ht 65.0 in | Wt 198.0 lb

## 2020-04-11 DIAGNOSIS — R030 Elevated blood-pressure reading, without diagnosis of hypertension: Secondary | ICD-10-CM | POA: Diagnosis not present

## 2020-04-11 DIAGNOSIS — F39 Unspecified mood [affective] disorder: Secondary | ICD-10-CM

## 2020-04-11 NOTE — Progress Notes (Signed)
Date:  04/11/2020   Name:  Madeline Moon   DOB:  December 04, 1960   MRN:  [id number]   Chief Complaint: Hypertension (f/u)  Hypertension The problem has been resolved since onset. The problem is controlled. Pertinent negatives include no chest pain, headaches or shortness of breath. Past treatments include lifestyle changes. The current treatment provides moderate improvement. There are no compliance problems.     Lab Results  Component Value Date   CREATININE 0.83 12/13/2019   BUN 19 12/13/2019   NA 143 12/13/2019   K 4.5 12/13/2019   CL 104 12/13/2019   CO2 24 12/13/2019   Lab Results  Component Value Date   CHOL 205 (H) 10/07/2019   HDL 58 10/07/2019   LDLCALC 133 (H) 10/07/2019   TRIG 78 10/07/2019   CHOLHDL 3.5 10/07/2019   Lab Results  Component Value Date   TSH 1.490 10/07/2019   Lab Results  Component Value Date   HGBA1C 6.3 (H) 10/06/2018   Lab Results  Component Value Date   WBC 6.6 10/07/2019   HGB 14.2 10/07/2019   HCT 44.3 10/07/2019   MCV 84 10/07/2019   PLT 334 10/07/2019   Lab Results  Component Value Date   ALT 15 10/07/2019   AST 15 10/07/2019   ALKPHOS 93 10/07/2019   BILITOT 0.6 10/07/2019     Review of Systems  Constitutional: Negative for chills, fatigue, fever and unexpected weight change.  Respiratory: Negative for cough, chest tightness and shortness of breath.   Cardiovascular: Negative for chest pain and leg swelling.  Neurological: Negative for dizziness, light-headedness and headaches.    Patient Active Problem List   Diagnosis Date Noted  . Chronic right shoulder pain 02/28/2020  . Mood disorder (HCC) 01/21/2020  . Essential hypertension 10/07/2019  . Gastroesophageal reflux disease without esophagitis 10/06/2018  . Palmar fascial fibromatosis 10/06/2018  . Hyperlipidemia, mild 08/12/2016  . Back muscle spasm 12/16/2014  . Mixed urge and stress incontinence 11/09/2014  . Osteoarthritis 11/09/2014    No Known  Allergies  Past Surgical History:  Procedure Laterality Date  . RIGHT OOPHORECTOMY    . TUBAL LIGATION      Social History   Tobacco Use  . Smoking status: Never Smoker  . Smokeless tobacco: Never Used  Vaping Use  . Vaping Use: Never used  Substance Use Topics  . Alcohol use: Yes    Alcohol/week: 2.0 standard drinks    Types: 2 Standard drinks or equivalent per week  . Drug use: No     Medication list has been reviewed and updated.  Current Meds  Medication Sig  . calcium-vitamin D (OSCAL WITH D) 500-200 MG-UNIT tablet Take 1 tablet by mouth.  . co-enzyme Q-10 30 MG capsule Take 30 mg by mouth daily.   Marland Kitchen escitalopram (LEXAPRO) 10 MG tablet Take 1 tablet (10 mg total) by mouth daily. (Patient taking differently: Take 5 mg by mouth daily. )  . ferrous sulfate 325 (65 FE) MG EC tablet Take 325 mg by mouth daily with breakfast.   . meloxicam (MOBIC) 15 MG tablet Take 1 tablet (15 mg total) by mouth daily. (Patient taking differently: Take 15 mg by mouth as needed. )  . Multiple Vitamin (MULTIVITAMIN) capsule Take 1 capsule by mouth daily.  Marland Kitchen omeprazole (PRILOSEC) 20 MG capsule Take 20 mg by mouth daily.  Marland Kitchen oxybutynin (DITROPAN XL) 5 MG 24 hr tablet Take 1 tablet (5 mg total) by mouth at bedtime.  PHQ 2/9 Scores 04/11/2020 02/28/2020 01/21/2020 12/09/2019  PHQ - 2 Score 0 0 4 0  PHQ- 9 Score 0 0 13 0    GAD 7 : Generalized Anxiety Score 04/11/2020 02/28/2020 01/21/2020 12/09/2019  Nervous, Anxious, on Edge 0 0 1 0  Control/stop worrying 0 0 1 0  Worry too much - different things 0 0 1 0  Trouble relaxing 0 0 0 0  Restless 0 0 0 0  Easily annoyed or irritable 0 0 1 0  Afraid - awful might happen 0 0 2 0  Total GAD 7 Score 0 0 6 0  Anxiety Difficulty - Not difficult at all Not difficult at all Not difficult at all    BP Readings from Last 3 Encounters:  04/11/20 126/80  02/28/20 118/74  01/21/20 118/78    Physical Exam Vitals and nursing note reviewed.  Constitutional:       General: She is not in acute distress.    Appearance: She is well-developed.  HENT:     Head: Normocephalic and atraumatic.  Cardiovascular:     Rate and Rhythm: Normal rate and regular rhythm.     Pulses: Normal pulses.     Heart sounds: No murmur heard.   Pulmonary:     Effort: Pulmonary effort is normal. No respiratory distress.     Breath sounds: No wheezing or rhonchi.  Musculoskeletal:     Cervical back: Normal range of motion.     Right lower leg: No edema.     Left lower leg: No edema.  Skin:    General: Skin is warm and dry.     Findings: No rash.  Neurological:     Mental Status: She is alert and oriented to person, place, and time.  Psychiatric:        Behavior: Behavior normal.        Thought Content: Thought content normal.     Wt Readings from Last 3 Encounters:  04/11/20 198 lb (89.8 kg)  02/28/20 198 lb (89.8 kg)  01/21/20 195 lb (88.5 kg)    BP 126/80   Pulse 67   Temp 97.6 F (36.4 C) (Oral)   Ht 5\' 5"  (1.651 m)   Wt 198 lb (89.8 kg)   SpO2 96%   BMI 32.95 kg/m   Assessment and Plan: 1. Elevated blood pressure reading Blood pressures are good Continue healthy diet, exercise, limited sodium diet  2. Mood disorder (HCC) Doing very well on low dose Lexapro   Partially dictated using . Any errors are unintentional.  Animal nutritionist, MD [facility]  04/11/2020

## 2020-04-15 ENCOUNTER — Other Ambulatory Visit: Payer: Self-pay | Admitting: Internal Medicine

## 2020-04-15 DIAGNOSIS — F39 Unspecified mood [affective] disorder: Secondary | ICD-10-CM

## 2020-04-15 NOTE — Telephone Encounter (Signed)
Requested Prescriptions  Pending Prescriptions Disp Refills   escitalopram (LEXAPRO) 10 MG tablet [Pharmacy Med Name: ESCITALOPRAM 10 MG TABLET] 30 tablet 2    Sig: TAKE 1 TABLET BY MOUTH EVERY DAY     Psychiatry:  Antidepressants - SSRI Passed - 04/15/2020  9:06 AM      Passed - Valid encounter within last 6 months    Recent Outpatient Visits          4 days ago Elevated blood pressure reading   [facility] Reubin Milan, MD   1 month ago Mood disorder [facility])   [facility] Reubin Milan, MD   2 months ago Mood disorder [facility])   [facility] Reubin Milan, MD   4 months ago Essential hypertension   [facility] Reubin Milan, MD   6 months ago Annual physical exam   [facility] Reubin Milan, MD      Future Appointments            In 5 months Judithann Graves Nyoka Cowden, MD [facility], [facility]

## 2020-04-16 ENCOUNTER — Other Ambulatory Visit: Payer: Self-pay | Admitting: Internal Medicine

## 2020-04-16 DIAGNOSIS — N3281 Overactive bladder: Secondary | ICD-10-CM

## 2020-06-03 DIAGNOSIS — R091 Pleurisy: Secondary | ICD-10-CM

## 2020-06-03 HISTORY — DX: Pleurisy: R09.1

## 2020-06-07 DIAGNOSIS — J189 Pneumonia, unspecified organism: Secondary | ICD-10-CM

## 2020-06-07 HISTORY — DX: Pneumonia, unspecified organism: J18.9

## 2020-07-08 ENCOUNTER — Other Ambulatory Visit: Payer: Self-pay | Admitting: Internal Medicine

## 2020-07-08 DIAGNOSIS — F39 Unspecified mood [affective] disorder: Secondary | ICD-10-CM

## 2020-07-08 NOTE — Telephone Encounter (Signed)
Requested Prescriptions  Pending Prescriptions Disp Refills  . escitalopram (LEXAPRO) 10 MG tablet [Pharmacy Med Name: ESCITALOPRAM 10 MG TABLET] 90 tablet 0    Sig: TAKE 1 TABLET BY MOUTH EVERY DAY     Psychiatry:  Antidepressants - SSRI Passed - 07/08/2020 10:22 AM      Passed - Valid encounter within last 6 months    Recent Outpatient Visits          2 months ago Elevated blood pressure reading   [facility] Reubin Milan, MD   4 months ago Mood disorder [facility])   [facility] Reubin Milan, MD   5 months ago Mood disorder [facility])   [facility] Reubin Milan, MD   7 months ago Essential hypertension   [facility] Reubin Milan, MD   9 months ago Annual physical exam   [facility] Reubin Milan, MD      Future Appointments            In 3 months Judithann Graves Nyoka Cowden, MD [facility], [facility]

## 2020-10-08 DIAGNOSIS — R7303 Prediabetes: Secondary | ICD-10-CM | POA: Insufficient documentation

## 2020-10-08 NOTE — Progress Notes (Signed)
Date:  10/09/2020   Name:  Madeline Moon   DOB:  1960-12-22   MRN:  [id number]   Chief Complaint: Annual Exam (Breast exam no pap)  Madeline Moon is a 60 y.o. female who presents today for her Complete Annual Exam. She feels well. She reports exercising none. She reports she is sleeping well. Breast complaints none.  Mammogram: 11/2018 DEXA: none Pap smear: 10/2018 neg with cotesting Colonoscopy: 09/2011  Immunization History  Administered Date(s) Administered  . Influenza,inj,Quad PF,6+ Mos 04/05/2016  . Tdap 08/10/2015  . Zoster Recombinat (Shingrix) 10/06/2018, 01/14/2019    Depression        This is a chronic problem.  Associated symptoms include insomnia (and nightmares with panic symptoms) and suicidal ideas.  Associated symptoms include no fatigue and no headaches.  Past treatments include SSRIs - Selective serotonin reuptake inhibitors (quit lexapro). Gastroesophageal Reflux She complains of heartburn. She reports no abdominal pain, no chest pain, no coughing or no wheezing. The problem occurs occasionally. Pertinent negatives include no fatigue. She has tried a PPI for the symptoms.  Diabetes She presents for her follow-up diabetic visit. Diabetes type: prediabetes. Pertinent negatives for hypoglycemia include no dizziness, headaches, nervousness/anxiousness or tremors. Pertinent negatives for diabetes include no chest pain, no fatigue, no polydipsia and no polyuria.  OAB - mild sx but not taking any medication.  Lab Results  Component Value Date   CREATININE 0.83 12/13/2019   BUN 19 12/13/2019   NA 143 12/13/2019   K 4.5 12/13/2019   CL 104 12/13/2019   CO2 24 12/13/2019   Lab Results  Component Value Date   CHOL 205 (H) 10/07/2019   HDL 58 10/07/2019   LDLCALC 133 (H) 10/07/2019   TRIG 78 10/07/2019   CHOLHDL 3.5 10/07/2019   Lab Results  Component Value Date   TSH 1.490 10/07/2019   Lab Results  Component Value Date   HGBA1C 6.3 (H)  10/06/2018   Lab Results  Component Value Date   WBC 6.6 10/07/2019   HGB 14.2 10/07/2019   HCT 44.3 10/07/2019   MCV 84 10/07/2019   PLT 334 10/07/2019   Lab Results  Component Value Date   ALT 15 10/07/2019   AST 15 10/07/2019   ALKPHOS 93 10/07/2019   BILITOT 0.6 10/07/2019     Review of Systems  Constitutional: Negative for chills, fatigue and fever.  HENT: Negative for congestion, hearing loss, tinnitus, trouble swallowing and voice change.   Eyes: Negative for visual disturbance.  Respiratory: Negative for cough, chest tightness, shortness of breath and wheezing.   Cardiovascular: Negative for chest pain, palpitations and leg swelling.  Gastrointestinal: Positive for heartburn. Negative for abdominal pain, constipation, diarrhea and vomiting.  Endocrine: Negative for polydipsia and polyuria.  Genitourinary: Negative for dysuria, frequency, genital sores, vaginal bleeding and vaginal discharge.  Musculoskeletal: Negative for arthralgias, gait problem and joint swelling.  Skin: Negative for color change and rash.  Neurological: Negative for dizziness, tremors, light-headedness and headaches.  Hematological: Negative for adenopathy. Does not bruise/bleed easily.  Psychiatric/Behavioral: Positive for depression, sleep disturbance and suicidal ideas. Negative for dysphoric mood. The patient has insomnia (and nightmares with panic symptoms). The patient is not nervous/anxious.     Patient Active Problem List   Diagnosis Date Noted  . Prediabetes 10/08/2020  . Chronic right shoulder pain 02/28/2020  . Major depression, single episode, in complete remission (HCC) 01/21/2020  . Elevated blood pressure reading 10/07/2019  . Gastroesophageal reflux disease without  esophagitis 10/06/2018  . Palmar fascial fibromatosis 10/06/2018  . Hyperlipidemia, mild 08/12/2016  . Back muscle spasm 12/16/2014  . Mixed urge and stress incontinence 11/09/2014  . Osteoarthritis 11/09/2014     No Known Allergies  Past Surgical History:  Procedure Laterality Date  . RIGHT OOPHORECTOMY    . TUBAL LIGATION      Social History   Tobacco Use  . Smoking status: Never Smoker  . Smokeless tobacco: Never Used  Vaping Use  . Vaping Use: Never used  Substance Use Topics  . Alcohol use: Yes    Alcohol/week: 2.0 standard drinks    Types: 2 Standard drinks or equivalent per week  . Drug use: No     Medication list has been reviewed and updated.  Current Meds  Medication Sig  . meloxicam (MOBIC) 15 MG tablet Take 1 tablet (15 mg total) by mouth daily. (Patient taking differently: Take 15 mg by mouth as needed.)  . omeprazole (PRILOSEC) 20 MG capsule Take 20 mg by mouth daily.  . [DISCONTINUED] benzonatate (TESSALON) 100 MG capsule Take by mouth.    PHQ 2/9 Scores 10/09/2020 04/11/2020 02/28/2020 01/21/2020  PHQ - 2 Score 0 0 0 4  PHQ- 9 Score 1 0 0 13    GAD 7 : Generalized Anxiety Score 04/11/2020 02/28/2020 01/21/2020 12/09/2019  Nervous, Anxious, on Edge 0 0 1 0  Control/stop worrying 0 0 1 0  Worry too much - different things 0 0 1 0  Trouble relaxing 0 0 0 0  Restless 0 0 0 0  Easily annoyed or irritable 0 0 1 0  Afraid - awful might happen 0 0 2 0  Total GAD 7 Score 0 0 6 0  Anxiety Difficulty - Not difficult at all Not difficult at all Not difficult at all    BP Readings from Last 3 Encounters:  10/09/20 118/76  04/11/20 126/80  02/28/20 118/74    Physical Exam Vitals and nursing note reviewed.  Constitutional:      General: She is not in acute distress.    Appearance: She is well-developed.  HENT:     Head: Normocephalic and atraumatic.     Right Ear: Tympanic membrane and ear canal normal.     Left Ear: Tympanic membrane and ear canal normal.     Nose:     Right Sinus: No maxillary sinus tenderness.     Left Sinus: No maxillary sinus tenderness.  Eyes:     General: No scleral icterus.       Right eye: No discharge.        Left eye: No discharge.      Conjunctiva/sclera: Conjunctivae normal.  Neck:     Thyroid: No thyromegaly.     Vascular: No carotid bruit.  Cardiovascular:     Rate and Rhythm: Normal rate and regular rhythm.     Pulses: Normal pulses.     Heart sounds: Normal heart sounds.  Pulmonary:     Effort: Pulmonary effort is normal. No respiratory distress.     Breath sounds: No wheezing.  Chest:  Breasts:     Right: No mass, nipple discharge, skin change or tenderness.     Left: No mass, nipple discharge, skin change or tenderness.    Abdominal:     General: Bowel sounds are normal.     Palpations: Abdomen is soft.     Tenderness: There is no abdominal tenderness.  Musculoskeletal:     Cervical back: Normal range of motion.  No erythema.     Right lower leg: No edema.     Left lower leg: No edema.  Lymphadenopathy:     Cervical: No cervical adenopathy.  Skin:    General: Skin is warm and dry.     Findings: No rash.  Neurological:     Mental Status: She is alert and oriented to person, place, and time.     Cranial Nerves: No cranial nerve deficit.     Sensory: No sensory deficit.     Deep Tendon Reflexes: Reflexes are normal and symmetric.  Psychiatric:        Attention and Perception: Attention normal.        Mood and Affect: Mood normal.     Wt Readings from Last 3 Encounters:  10/09/20 190 lb (86.2 kg)  04/11/20 198 lb (89.8 kg)  02/28/20 198 lb (89.8 kg)    BP 118/76   Pulse 69   Temp (!) 97.5 F (36.4 C) (Oral)   Ht 5\' 5"  (1.651 m)   Wt 190 lb (86.2 kg)   SpO2 99%   BMI 31.62 kg/m   Assessment and Plan: 1. Annual physical exam Exam is normal except for weight. Encourage regular exercise and appropriate dietary changes. She plans to start a new diet to help with weight loss. - CBC with Differential/Platelet - Comprehensive metabolic panel - Lipid panel  2. Encounter for screening mammogram for breast cancer To be scheduled - MM 3D SCREEN BREAST BILATERAL; Future  3. Major  depression, single episode, in complete remission (HCC) Recommend that she resume Lexapro to help reduce anxiety and nightmares/panic sx. Call for refill if needed. - TSH  4. Mixed urge and stress incontinence Mild, stable sx not requiring medication at present.  5. Prediabetes Check labs and advise. - Hemoglobin A1c   Partially dictated using . Any errors are unintentional.  Animal nutritionist, MD [facility]  10/09/2020

## 2020-10-09 ENCOUNTER — Encounter: Payer: Self-pay | Admitting: Internal Medicine

## 2020-10-09 ENCOUNTER — Ambulatory Visit (INDEPENDENT_AMBULATORY_CARE_PROVIDER_SITE_OTHER): Payer: 59 | Admitting: Internal Medicine

## 2020-10-09 ENCOUNTER — Other Ambulatory Visit: Payer: Self-pay

## 2020-10-09 VITALS — BP 118/76 | HR 69 | Temp 97.5°F | Ht 65.0 in | Wt 190.0 lb

## 2020-10-09 DIAGNOSIS — R7303 Prediabetes: Secondary | ICD-10-CM

## 2020-10-09 DIAGNOSIS — Z1231 Encounter for screening mammogram for malignant neoplasm of breast: Secondary | ICD-10-CM

## 2020-10-09 DIAGNOSIS — F325 Major depressive disorder, single episode, in full remission: Secondary | ICD-10-CM | POA: Diagnosis not present

## 2020-10-09 DIAGNOSIS — Z0000 Encounter for general adult medical examination without abnormal findings: Secondary | ICD-10-CM

## 2020-10-09 DIAGNOSIS — N3946 Mixed incontinence: Secondary | ICD-10-CM

## 2020-10-10 LAB — COMPREHENSIVE METABOLIC PANEL
ALT: 13 IU/L (ref 0–32)
AST: 16 IU/L (ref 0–40)
Albumin/Globulin Ratio: 1.9 (ref 1.2–2.2)
Albumin: 4.1 g/dL (ref 3.8–4.9)
Alkaline Phosphatase: 100 IU/L (ref 44–121)
BUN/Creatinine Ratio: 18 (ref 12–28)
BUN: 15 mg/dL (ref 8–27)
Bilirubin Total: 0.4 mg/dL (ref 0.0–1.2)
CO2: 23 mmol/L (ref 20–29)
Calcium: 9.1 mg/dL (ref 8.7–10.3)
Chloride: 102 mmol/L (ref 96–106)
Creatinine, Ser: 0.82 mg/dL (ref 0.57–1.00)
Globulin, Total: 2.2 g/dL (ref 1.5–4.5)
Glucose: 112 mg/dL — ABNORMAL HIGH (ref 65–99)
Potassium: 4.7 mmol/L (ref 3.5–5.2)
Sodium: 139 mmol/L (ref 134–144)
Total Protein: 6.3 g/dL (ref 6.0–8.5)
eGFR: 82 mL/min/{1.73_m2} (ref >59)

## 2020-10-10 LAB — CBC WITH DIFFERENTIAL/PLATELET
Basophils Absolute: 0.1 10*3/uL (ref 0.0–0.2)
Basos: 1 %
EOS (ABSOLUTE): 0.1 10*3/uL (ref 0.0–0.4)
Eos: 3 %
Hematocrit: 41 % (ref 34.0–46.6)
Hemoglobin: 13.4 g/dL (ref 11.1–15.9)
Immature Grans (Abs): 0 10*3/uL (ref 0.0–0.1)
Immature Granulocytes: 0 %
Lymphocytes Absolute: 1.5 10*3/uL (ref 0.7–3.1)
Lymphs: 38 %
MCH: 27.9 pg (ref 26.6–33.0)
MCHC: 32.7 g/dL (ref 31.5–35.7)
MCV: 85 fL (ref 79–97)
Monocytes Absolute: 0.4 10*3/uL (ref 0.1–0.9)
Monocytes: 10 %
Neutrophils Absolute: 1.9 10*3/uL (ref 1.4–7.0)
Neutrophils: 48 %
Platelets: 267 10*3/uL (ref 150–450)
RBC: 4.81 x10E6/uL (ref 3.77–5.28)
RDW: 12.9 % (ref 11.7–15.4)
WBC: 4 10*3/uL (ref 3.4–10.8)

## 2020-10-10 LAB — LIPID PANEL
Chol/HDL Ratio: 3.9 ratio (ref 0.0–4.4)
Cholesterol, Total: 218 mg/dL — ABNORMAL HIGH (ref 100–199)
HDL: 56 mg/dL (ref >39)
LDL Chol Calc (NIH): 139 mg/dL — ABNORMAL HIGH (ref 0–99)
Triglycerides: 130 mg/dL (ref 0–149)
VLDL Cholesterol Cal: 23 mg/dL (ref 5–40)

## 2020-10-10 LAB — HEMOGLOBIN A1C
Est. average glucose Bld gHb Est-mCnc: 128 mg/dL
Hgb A1c MFr Bld: 6.1 % — ABNORMAL HIGH (ref 4.8–5.6)

## 2020-10-10 LAB — TSH: TSH: 1.9 u[IU]/mL (ref 0.450–4.500)

## 2020-11-13 ENCOUNTER — Ambulatory Visit
Admission: RE | Admit: 2020-11-13 | Discharge: 2020-11-13 | Disposition: A | Discharge status: Home / Self Care | Payer: 59 | Source: Ambulatory Visit | Attending: Internal Medicine | Admitting: Internal Medicine

## 2020-11-13 ENCOUNTER — Other Ambulatory Visit: Payer: Self-pay

## 2020-11-13 DIAGNOSIS — Z1231 Encounter for screening mammogram for malignant neoplasm of breast: Secondary | ICD-10-CM | POA: Insufficient documentation

## 2020-11-14 ENCOUNTER — Other Ambulatory Visit: Payer: Self-pay | Admitting: Internal Medicine

## 2020-11-14 DIAGNOSIS — N6320 Unspecified lump in the left breast, unspecified quadrant: Secondary | ICD-10-CM

## 2020-11-14 DIAGNOSIS — R928 Other abnormal and inconclusive findings on diagnostic imaging of breast: Secondary | ICD-10-CM

## 2020-11-20 ENCOUNTER — Other Ambulatory Visit: Payer: Self-pay

## 2020-11-20 ENCOUNTER — Ambulatory Visit
Admission: RE | Admit: 2020-11-20 | Discharge: 2020-11-20 | Disposition: A | Discharge status: Home / Self Care | Payer: 59 | Source: Ambulatory Visit | Attending: Internal Medicine | Admitting: Internal Medicine

## 2020-11-20 DIAGNOSIS — N6320 Unspecified lump in the left breast, unspecified quadrant: Secondary | ICD-10-CM | POA: Insufficient documentation

## 2020-11-20 DIAGNOSIS — R928 Other abnormal and inconclusive findings on diagnostic imaging of breast: Secondary | ICD-10-CM | POA: Insufficient documentation

## 2021-05-15 ENCOUNTER — Encounter: Payer: Self-pay | Admitting: Internal Medicine

## 2021-05-15 ENCOUNTER — Other Ambulatory Visit: Payer: Self-pay

## 2021-05-15 ENCOUNTER — Ambulatory Visit (INDEPENDENT_AMBULATORY_CARE_PROVIDER_SITE_OTHER): Payer: 59 | Admitting: Internal Medicine

## 2021-05-15 VITALS — BP 122/78 | HR 70 | Temp 97.9°F | Ht 65.5 in | Wt 190.0 lb

## 2021-05-15 DIAGNOSIS — U071 COVID-19: Secondary | ICD-10-CM

## 2021-05-15 HISTORY — DX: COVID-19: U07.1

## 2021-05-15 LAB — POC COVID19 BINAXNOW: SARS Coronavirus 2 Ag: POSITIVE — AB

## 2021-05-15 MED ORDER — MOLNUPIRAVIR EUA 200MG CAPSULE: 4.0000 | ORAL_CAPSULE | Freq: Two times a day (BID) | ORAL | 0 refills | Status: AC

## 2021-05-15 MED ORDER — PROMETHAZINE-DM 6.25-15 MG/5ML PO SYRP: 5.0000 mL | ORAL_SOLUTION | Freq: Four times a day (QID) | ORAL | 0 refills | Status: DC | PRN

## 2021-05-15 NOTE — Progress Notes (Signed)
Date:  05/15/2021   Name:  Madeline Moon   DOB:  03/16/1961   MRN:  [id number]   Chief Complaint: Sinusitis  Sinusitis This is a new problem. Associated symptoms include chills, congestion, coughing, headaches and a sore throat. Pertinent negatives include no shortness of breath.  She went to a visitation and then funeral over the weekend as the likely source. Lab Results  Component Value Date   NA 139 10/09/2020   K 4.7 10/09/2020   CO2 23 10/09/2020   GLUCOSE 112 (H) 10/09/2020   BUN 15 10/09/2020   CREATININE 0.82 10/09/2020   CALCIUM 9.1 10/09/2020   EGFR 82 10/09/2020   GFRNONAA 77 12/13/2019   Lab Results  Component Value Date   CHOL 218 (H) 10/09/2020   HDL 56 10/09/2020   LDLCALC 139 (H) 10/09/2020   TRIG 130 10/09/2020   CHOLHDL 3.9 10/09/2020   Lab Results  Component Value Date   TSH 1.900 10/09/2020   Lab Results  Component Value Date   HGBA1C 6.1 (H) 10/09/2020   Lab Results  Component Value Date   WBC 4.0 10/09/2020   HGB 13.4 10/09/2020   HCT 41.0 10/09/2020   MCV 85 10/09/2020   PLT 267 10/09/2020   Lab Results  Component Value Date   ALT 13 10/09/2020   AST 16 10/09/2020   ALKPHOS 100 10/09/2020   BILITOT 0.4 10/09/2020   No results found for: 25OHVITD2, 25OHVITD3, VD25OH   Review of Systems  Constitutional:  Positive for chills and fatigue. Negative for fever.  HENT:  Positive for congestion and sore throat.   Respiratory:  Positive for cough. Negative for chest tightness, shortness of breath and wheezing.   Cardiovascular:  Negative for chest pain, palpitations and leg swelling.  Gastrointestinal:  Negative for diarrhea, nausea and vomiting.  Neurological:  Positive for headaches. Negative for dizziness and light-headedness.   Patient Active Problem List   Diagnosis Date Noted   Prediabetes 10/08/2020   Chronic right shoulder pain 02/28/2020   Major depression, single episode, in complete remission ([place]) 01/21/2020    Elevated blood pressure reading 10/07/2019   Gastroesophageal reflux disease without esophagitis 10/06/2018   Palmar fascial fibromatosis 10/06/2018   Hyperlipidemia, mild 08/12/2016   Back muscle spasm 12/16/2014   Mixed urge and stress incontinence 11/09/2014   Osteoarthritis 11/09/2014    No Known Allergies  Past Surgical History:  Procedure Laterality Date   RIGHT OOPHORECTOMY     TUBAL LIGATION      Social History   Tobacco Use   Smoking status: Never   Smokeless tobacco: Never  Vaping Use   Vaping Use: Never used  Substance Use Topics   Alcohol use: Yes    Alcohol/week: 2.0 standard drinks    Types: 2 Standard drinks or equivalent per week   Drug use: No     Medication list has been reviewed and updated.  No outpatient medications have been marked as taking for the 05/15/21 encounter (Office Visit) with Glean Hess, MD.    [facility] 2/9 Scores 05/15/2021 10/09/2020 04/11/2020 02/28/2020  PHQ - 2 Score 0 0 0 0  PHQ- 9 Score 0 1 0 0    GAD 7 : Generalized Anxiety Score 05/15/2021 10/09/2020 04/11/2020 02/28/2020  Nervous, Anxious, on Edge 0 0 0 0  Control/stop worrying 0 0 0 0  Worry too much - different things 0 0 0 0  Trouble relaxing 0 0 0 0  Restless 0 0 0 0  Easily annoyed or irritable 0 0 0 0  Afraid - awful might happen 0 0 0 0  Total GAD 7 Score 0 0 0 0  Anxiety Difficulty Not difficult at all - - Not difficult at all    BP Readings from Last 3 Encounters:  05/15/21 122/78  10/09/20 118/76  04/11/20 126/80    Physical Exam Vitals and nursing note reviewed.  Constitutional:      General: She is not in acute distress.    Appearance: Normal appearance. She is well-developed. She is ill-appearing.  HENT:     Head: Normocephalic and atraumatic.     Right Ear: Tympanic membrane normal.     Left Ear: Tympanic membrane normal.     Nose: Nose normal.  Cardiovascular:     Rate and Rhythm: Normal rate and regular rhythm.     Pulses: Normal pulses.   Pulmonary:     Effort: Pulmonary effort is normal. No respiratory distress.     Breath sounds: No wheezing or rhonchi.  Musculoskeletal:     Cervical back: Normal range of motion.  Lymphadenopathy:     Cervical: No cervical adenopathy.  Skin:    General: Skin is warm and dry.     Findings: No rash.  Neurological:     Mental Status: She is alert and oriented to person, place, and time.  Psychiatric:        Mood and Affect: Mood normal.        Behavior: Behavior normal.    Wt Readings from Last 3 Encounters:  05/15/21 190 lb (86.2 kg)  10/09/20 190 lb (86.2 kg)  04/11/20 198 lb (89.8 kg)    BP 122/78    Pulse 70    Temp 97.9 F (36.6 C) (Oral)    Ht 5' 5.5" (1.664 m)    Wt 190 lb (86.2 kg)    SpO2 94%    BMI 31.14 kg/m   Assessment and Plan: 1. COVID-19 virus infection Continue Tylenol around the clock; fluids, rest Okay to take Sudafed for congestion Monitor for worsening fever and/or SOB at rest - POC COVID-19 BinaxNow - molnupiravir EUA (LAGEVRIO) 200 mg CAPS capsule; Take 4 capsules (800 mg total) by mouth 2 (two) times daily for 5 days.  Dispense: 40 capsule; Refill: 0 - promethazine-dextromethorphan (PROMETHAZINE-DM) 6.25-15 MG/5ML syrup; Take 5 mLs by mouth 4 (four) times daily as needed for cough.  Dispense: 180 mL; Refill: 0   Partially dictated using Editor, commissioning. Any errors are unintentional.  Halina Maidens, MD [facility]  05/15/2021

## 2021-07-12 IMAGING — MG MM DIGITAL SCREENING BILAT W/ TOMO AND CAD
8 series · 8 of 24 positions shown · non-contrast
Comparison: Previous exam(s).

CLINICAL DATA: Screening.

EXAM:
DIGITAL SCREENING BILATERAL MAMMOGRAM WITH TOMOSYNTHESIS AND CAD
TECHNIQUE: Bilateral screening digital craniocaudal and mediolateral oblique
mammograms were obtained. Bilateral screening digital breast
tomosynthesis was performed. The images were evaluated with
computer-aided detection.

[L MLO synth-2D]
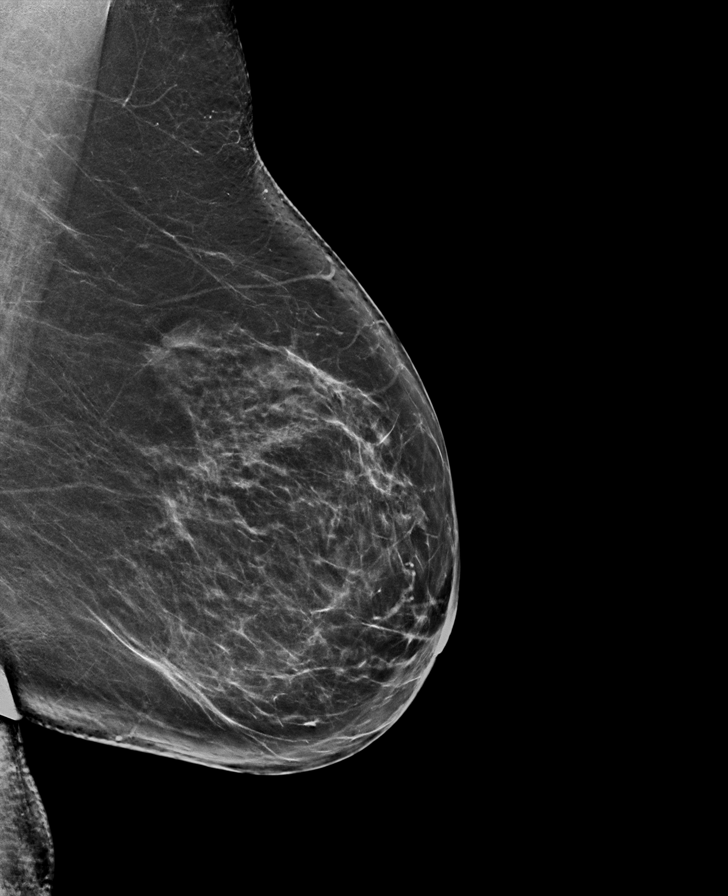

[L CC synth-2D]
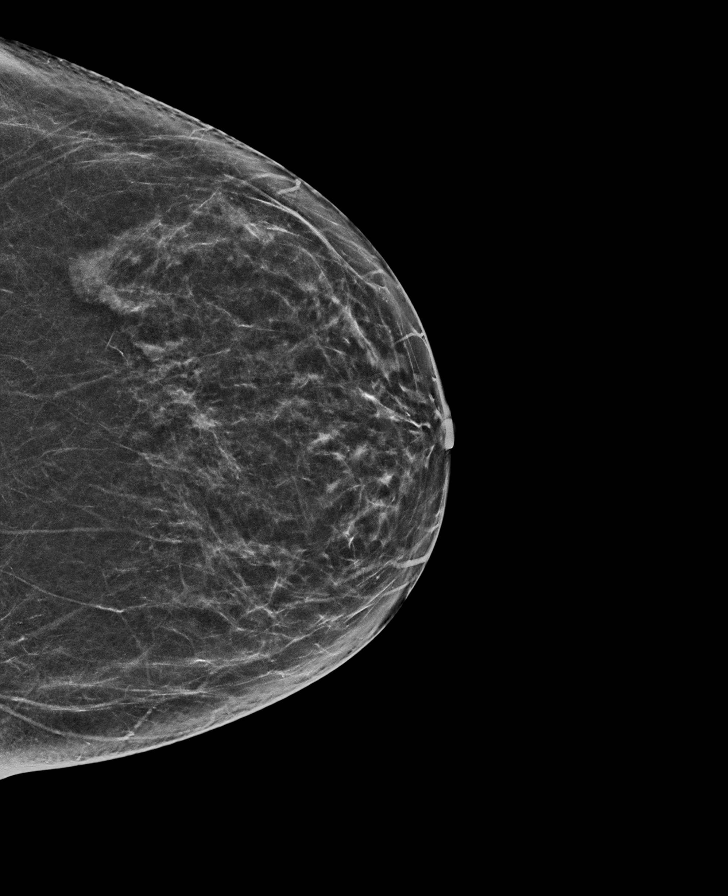

[R CC synth-2D]
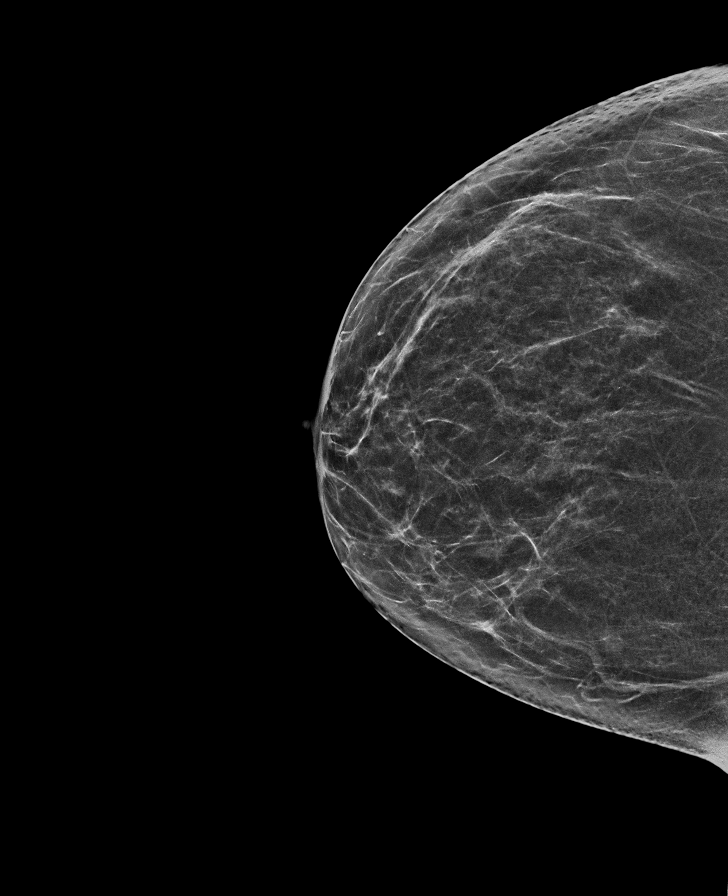

[R MLO synth-2D]
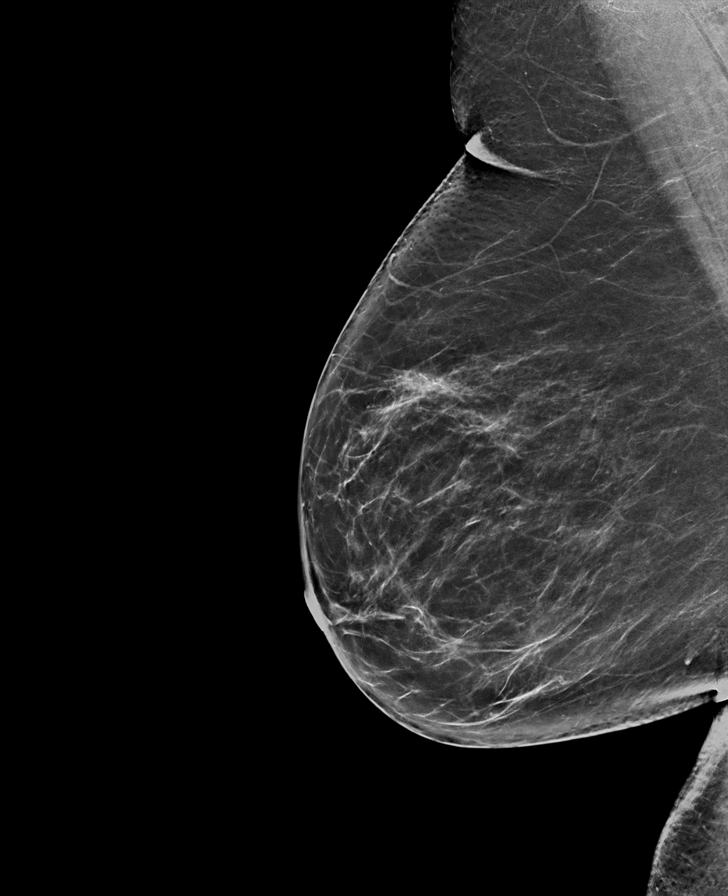

[R CC tomo · tomo slice 34/67.0]
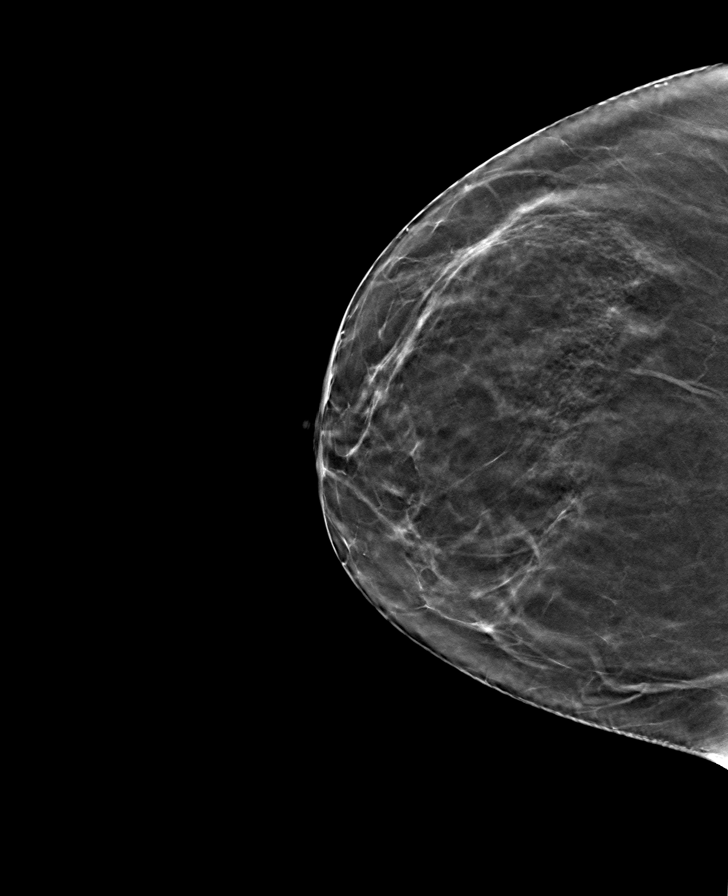

[R MLO tomo · tomo slice 39/76.0]
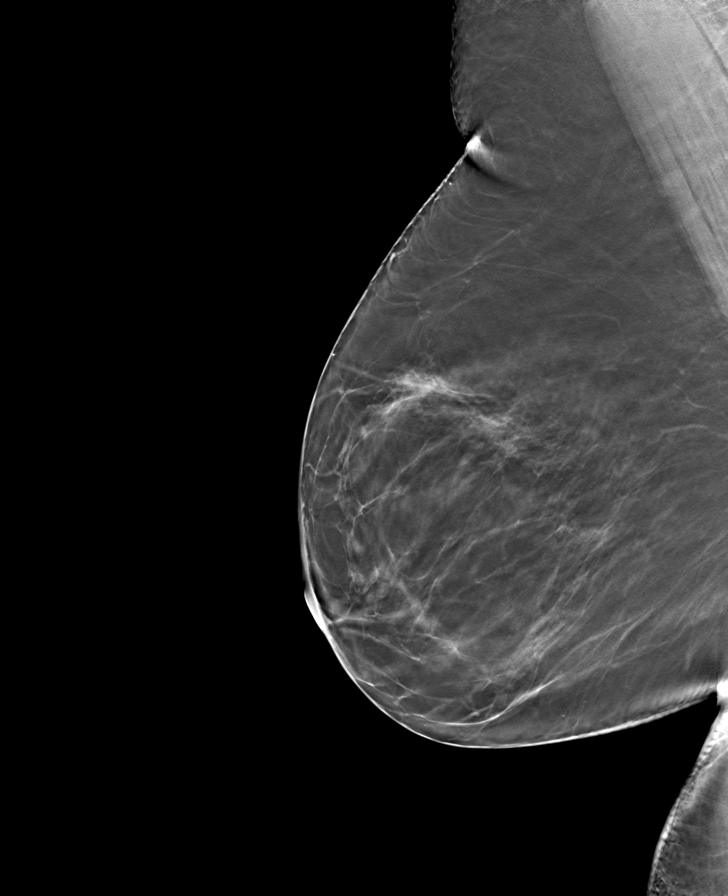

[L CC tomo · tomo slice 34/67.0]
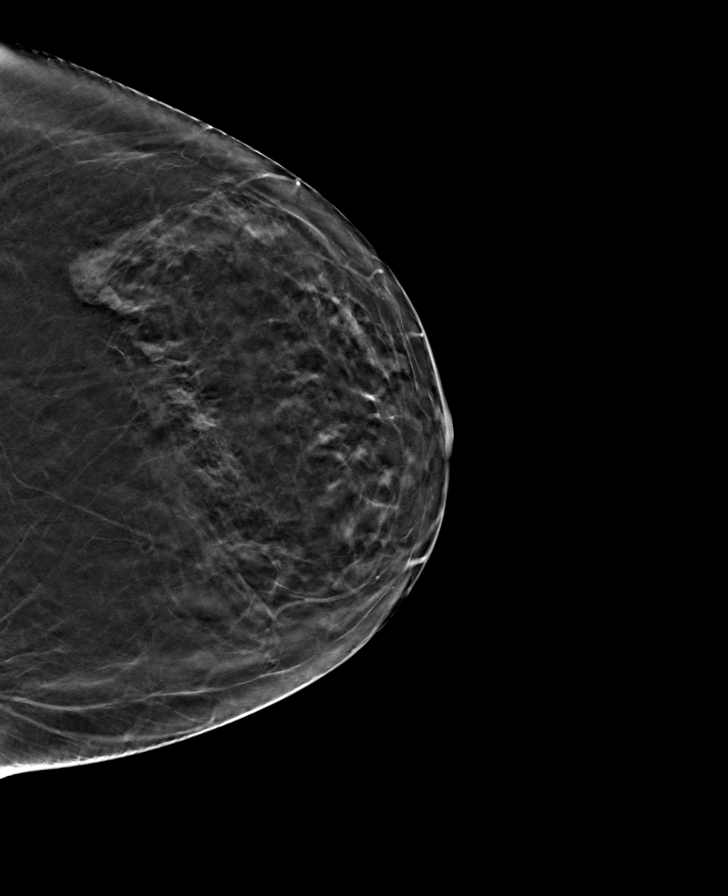

[L MLO tomo · tomo slice 39/78.0]
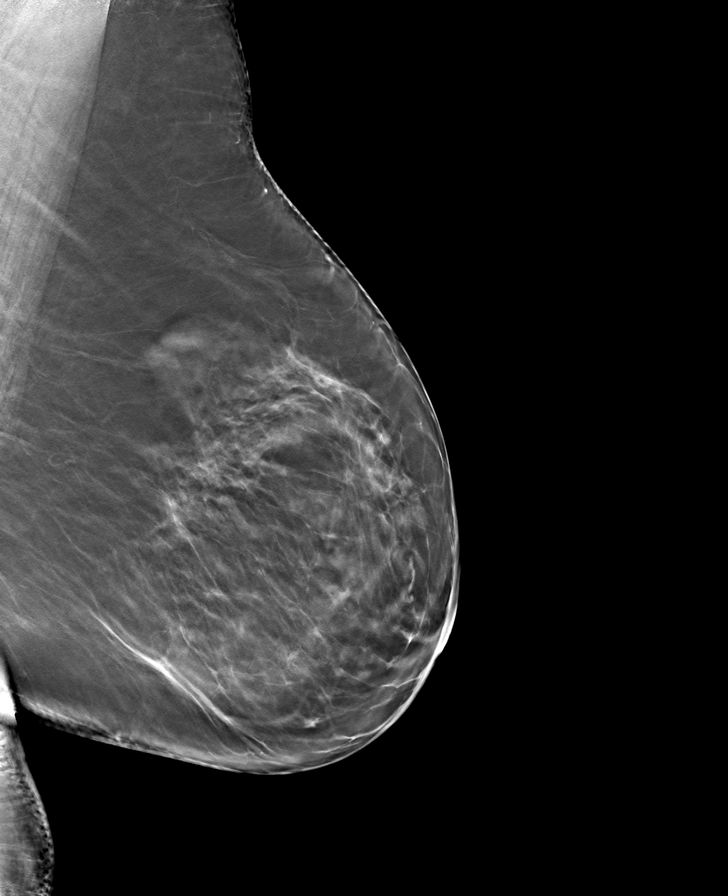

[8 of 24 positions shown; findings below may reference images not displayed]

ACR Breast Density Category b: There are scattered areas of
fibroglandular density.
FINDINGS: In the left breast, a possible mass warrants further evaluation. In
the right breast, no findings suspicious for malignancy.
IMPRESSION: Further evaluation is suggested for a possible mass in the left
breast.

RECOMMENDATION:
Diagnostic mammogram and possibly ultrasound of the left breast.
(Code:[id number]-X-44M)

The patient will be contacted regarding the findings, and additional
imaging will be scheduled.

BI-RADS CATEGORY  0: Incomplete. Need additional imaging evaluation
and/or prior mammograms for comparison.

## 2021-08-06 ENCOUNTER — Encounter: Payer: Self-pay | Admitting: Internal Medicine

## 2021-08-06 ENCOUNTER — Ambulatory Visit (INDEPENDENT_AMBULATORY_CARE_PROVIDER_SITE_OTHER): Payer: 59 | Admitting: Internal Medicine

## 2021-08-06 ENCOUNTER — Other Ambulatory Visit: Payer: Self-pay

## 2021-08-06 ENCOUNTER — Ambulatory Visit: Payer: Self-pay

## 2021-08-06 VITALS — BP 110/72 | HR 75 | Ht 65.0 in | Wt 142.0 lb

## 2021-08-06 DIAGNOSIS — N3000 Acute cystitis without hematuria: Secondary | ICD-10-CM

## 2021-08-06 LAB — POCT UA - MICROSCOPIC ONLY
Casts, Ur, LPF, POC: 0
Crystals, Ur, HPF, POC: 0
Mucus, UA: 0
RBC, Urine, Miroscopic: 15 (ref 0–2)
WBC, Ur, HPF, POC: 50 (ref 0–5)
Yeast, UA: 0

## 2021-08-06 MED ORDER — SULFAMETHOXAZOLE-TRIMETHOPRIM 800-160 MG PO TABS: 1.0000 | ORAL_TABLET | Freq: Two times a day (BID) | ORAL | 0 refills | Status: AC

## 2021-08-06 NOTE — Progress Notes (Signed)
? ? ?Date:  08/06/2021  ? ?Name:  Madeline Moon   DOB:  [birth year]   MRN:  [id number] ? ? ?Chief Complaint: Cystitis (X2 days, burning, painful urinations, ) ? ?Urinary Tract Infection  ?This is a new problem. Episode onset: X2 days. The problem occurs every urination. The problem has been gradually worsening. The quality of the pain is described as burning. The pain is at a severity of 9/10. The pain is moderate. There has been no fever. She is Not sexually active. There is No history of pyelonephritis. Associated symptoms include frequency, hematuria and urgency. Pertinent negatives include no chills. Treatments tried: azo. The treatment provided mild relief.  ? ?Lab Results  ?Component Value Date  ? NA 139 10/09/2020  ? K 4.7 10/09/2020  ? CO2 23 10/09/2020  ? GLUCOSE 112 (H) 10/09/2020  ? BUN 15 10/09/2020  ? CREATININE 0.82 10/09/2020  ? CALCIUM 9.1 10/09/2020  ? EGFR 82 10/09/2020  ? GFRNONAA 77 12/13/2019  ? ?Lab Results  ?Component Value Date  ? CHOL 218 (H) 10/09/2020  ? HDL 56 10/09/2020  ? LDLCALC 139 (H) 10/09/2020  ? TRIG 130 10/09/2020  ? CHOLHDL 3.9 10/09/2020  ? ?Lab Results  ?Component Value Date  ? TSH 1.900 10/09/2020  ? ?Lab Results  ?Component Value Date  ? HGBA1C 6.1 (H) 10/09/2020  ? ?Lab Results  ?Component Value Date  ? WBC 4.0 10/09/2020  ? HGB 13.4 10/09/2020  ? HCT 41.0 10/09/2020  ? MCV 85 10/09/2020  ? PLT 267 10/09/2020  ? ?Lab Results  ?Component Value Date  ? ALT 13 10/09/2020  ? AST 16 10/09/2020  ? ALKPHOS 100 10/09/2020  ? BILITOT 0.4 10/09/2020  ? ?No results found for: 25OHVITD2, [place], VD25OH  ? ?Review of Systems  ?Constitutional:  Positive for unexpected weight change (she has lost 60+ lbs on keto diet). Negative for chills, fatigue and fever.  ?Respiratory:  Negative for cough, chest tightness, shortness of breath and wheezing.   ?Cardiovascular:  Negative for chest pain and palpitations.  ?Genitourinary:  Positive for frequency, hematuria and urgency.  ? ?Patient  Active Problem List  ? Diagnosis Date Noted  ? Prediabetes 10/08/2020  ? Chronic right shoulder pain 02/28/2020  ? Elevated blood pressure reading 10/07/2019  ? Gastroesophageal reflux disease without esophagitis 10/06/2018  ? Palmar fascial fibromatosis 10/06/2018  ? Hyperlipidemia, mild 08/12/2016  ? Back muscle spasm 12/16/2014  ? Mixed urge and stress incontinence 11/09/2014  ? Osteoarthritis 11/09/2014  ? ? ?No Known Allergies ? ?Past Surgical History:  ?Procedure Laterality Date  ? RIGHT OOPHORECTOMY    ? TUBAL LIGATION    ? ? ?Social History  ? ?Tobacco Use  ? Smoking status: Never  ? Smokeless tobacco: Never  ?Vaping Use  ? Vaping Use: Never used  ?Substance Use Topics  ? Alcohol use: Yes  ?  Alcohol/week: 2.0 standard drinks  ?  Types: 2 Standard drinks or equivalent per week  ? Drug use: No  ? ? ? ?Medication list has been reviewed and updated. ? ?Current Meds  ?Medication Sig  ? sulfamethoxazole-trimethoprim (BACTRIM DS) 800-160 MG tablet Take 1 tablet by mouth 2 (two) times daily for 7 days.  ? ? ?PHQ 2/9 Scores 08/06/2021 05/15/2021 10/09/2020 04/11/2020  ?PHQ - 2 Score 0 0 0 0  ?PHQ- 9 Score 0 0 1 0  ? ? ?GAD 7 : Generalized Anxiety Score 08/06/2021 05/15/2021 10/09/2020 04/11/2020  ?Nervous, Anxious, on Edge 0 0 0 0  ?  Control/stop worrying 0 0 0 0  ?Worry too much - different things 0 0 0 0  ?Trouble relaxing 0 0 0 0  ?Restless 0 0 0 0  ?Easily annoyed or irritable 0 0 0 0  ?Afraid - awful might happen 0 0 0 0  ?Total GAD 7 Score 0 0 0 0  ?Anxiety Difficulty - Not difficult at all - -  ? ? ?BP Readings from Last 3 Encounters:  ?08/06/21 110/72  ?05/15/21 122/78  ?10/09/20 118/76  ? ? ?Physical Exam ?Vitals and nursing note reviewed.  ?Constitutional:   ?   General: She is not in acute distress. ?   Appearance: She is well-developed.  ?HENT:  ?   Head: Normocephalic and atraumatic.  ?Cardiovascular:  ?   Rate and Rhythm: Normal rate and regular rhythm.  ?   Pulses: Normal pulses.  ?Pulmonary:  ?   Effort:  Pulmonary effort is normal. No respiratory distress.  ?   Breath sounds: No wheezing or rhonchi.  ?Abdominal:  ?   Tenderness: There is abdominal tenderness in the suprapubic area. There is no right CVA tenderness or left CVA tenderness.  ?Musculoskeletal:  ?   Cervical back: Normal range of motion.  ?   Right lower leg: No edema.  ?   Left lower leg: No edema.  ?Skin: ?   General: Skin is warm and dry.  ?   Findings: No rash.  ?Neurological:  ?   Mental Status: She is alert and oriented to person, place, and time.  ?Psychiatric:     ?   Mood and Affect: Mood normal.     ?   Behavior: Behavior normal.  ? ? ?Wt Readings from Last 3 Encounters:  ?08/06/21 142 lb (64.4 kg)  ?05/15/21 190 lb (86.2 kg)  ?10/09/20 190 lb (86.2 kg)  ? ? ?BP 110/72   Pulse 75   Ht 5' 5"  (1.651 m)   Wt 142 lb (64.4 kg)   SpO2 99%   BMI 23.63 kg/m?  ? ?Assessment and Plan: ?1. Acute cystitis without hematuria ?She had a vaginal prolapse that she believes is her bladder. ?She has tried Kegel exercises with no benefit ?Recommend increased fluids - follow up for CPX and pelvic/Pap ?- POCT UA - Microscopic Only ?- sulfamethoxazole-trimethoprim (BACTRIM DS) 800-160 MG tablet; Take 1 tablet by mouth 2 (two) times daily for 7 days.  Dispense: 14 tablet; Refill: 0 ? ? ?Partially dictated using Editor, commissioning. Any errors are unintentional. ? ?Halina Maidens, MD ?[facility] ?[facility] ? ?08/06/2021 ? ? ? ? ? ?

## 2021-08-06 NOTE — Telephone Encounter (Signed)
?  Chief Complaint: UTI ?Symptoms: urinary pain 9/10, dysuria, urgency, bladder spasms ?Frequency: since Sunday ?Pertinent Negatives: NA ?Disposition: [] ED /[] Urgent Care (no appt availability in office) / [x] Appointment(In office/virtual)/ []  [place] [facility]/ [] Home Care/ [] Refused Recommended Disposition /[] [place] [facility]/ []  Follow-up with PCP ?Additional Notes: Pt also reports pink blood when wiping at times.  ? ? ?Reason for Disposition ? [1] Can't control passage of urine (i.e., urinary incontinence) AND [2] new-onset (< 2 weeks) or worsening ? ?Answer Assessment - Initial Assessment Questions ?1. SYMPTOM: "What's the main symptom you're concerned about?" (e.g., frequency, incontinence) ?    Dysuria, urgency, bladder spasms ?2. ONSET: "When did the  sx  start?" ?    Sunday morning ?3. PAIN: "Is there any pain?" If Yes, ask: "How bad is it?" (Scale: 1-10; mild, moderate, severe) ?    9/10 ?4. CAUSE: "What do you think is causing the symptoms?" ?    UTI ?5. OTHER SYMPTOMS: "Do you have any other symptoms?" (e.g., fever, flank pain, blood in urine, pain with urination) ?    Pink blood when wiping ? ?Protocols used: Urinary Symptoms-A-AH ? ?

## 2021-09-19 ENCOUNTER — Encounter: Payer: Self-pay | Admitting: Internal Medicine

## 2021-09-19 ENCOUNTER — Other Ambulatory Visit: Payer: Self-pay

## 2021-09-19 ENCOUNTER — Ambulatory Visit (INDEPENDENT_AMBULATORY_CARE_PROVIDER_SITE_OTHER): Payer: 59 | Admitting: Internal Medicine

## 2021-09-19 ENCOUNTER — Other Ambulatory Visit
Admission: RE | Admit: 2021-09-19 | Discharge: 2021-09-19 | Disposition: A | Discharge status: Home / Self Care | Payer: 59 | Attending: Internal Medicine | Admitting: Internal Medicine

## 2021-09-19 VITALS — BP 108/78 | HR 63 | Ht 65.0 in | Wt 143.0 lb

## 2021-09-19 DIAGNOSIS — E785 Hyperlipidemia, unspecified: Secondary | ICD-10-CM

## 2021-09-19 DIAGNOSIS — Z0000 Encounter for general adult medical examination without abnormal findings: Secondary | ICD-10-CM

## 2021-09-19 DIAGNOSIS — R7303 Prediabetes: Secondary | ICD-10-CM

## 2021-09-19 DIAGNOSIS — M720 Palmar fascial fibromatosis [Dupuytren]: Secondary | ICD-10-CM

## 2021-09-19 DIAGNOSIS — Z1231 Encounter for screening mammogram for malignant neoplasm of breast: Secondary | ICD-10-CM

## 2021-09-19 DIAGNOSIS — Z1211 Encounter for screening for malignant neoplasm of colon: Secondary | ICD-10-CM

## 2021-09-19 DIAGNOSIS — N8110 Cystocele, unspecified: Secondary | ICD-10-CM | POA: Diagnosis not present

## 2021-09-19 LAB — COMPREHENSIVE METABOLIC PANEL
ALT: 26 U/L (ref 0–44)
AST: 24 U/L (ref 15–41)
Albumin: 4.1 g/dL (ref 3.5–5.0)
Alkaline Phosphatase: 91 U/L (ref 38–126)
Anion gap: 8 (ref 5–15)
BUN: 26 mg/dL — ABNORMAL HIGH (ref 8–23)
CO2: 26 mmol/L (ref 22–32)
Calcium: 8.7 mg/dL — ABNORMAL LOW (ref 8.9–10.3)
Chloride: 104 mmol/L (ref 98–111)
Creatinine, Ser: 0.88 mg/dL (ref 0.44–1.00)
GFR, Estimated: >60 mL/min (ref >60)
Glucose, Bld: 103 mg/dL — ABNORMAL HIGH (ref 70–99)
Potassium: 4.1 mmol/L (ref 3.5–5.1)
Sodium: 138 mmol/L (ref 135–145)
Total Bilirubin: 0.4 mg/dL (ref 0.3–1.2)
Total Protein: 7.2 g/dL (ref 6.5–8.1)

## 2021-09-19 LAB — CBC WITH DIFFERENTIAL/PLATELET
Abs Immature Granulocytes: 0 10*3/uL (ref 0.00–0.07)
Basophils Absolute: 0 10*3/uL (ref 0.0–0.1)
Basophils Relative: 1 %
Eosinophils Absolute: 0.2 10*3/uL (ref 0.0–0.5)
Eosinophils Relative: 4 %
HCT: 42 % (ref 36.0–46.0)
Hemoglobin: 13.5 g/dL (ref 12.0–15.0)
Immature Granulocytes: 0 %
Lymphocytes Relative: 29 %
Lymphs Abs: 1.1 10*3/uL (ref 0.7–4.0)
MCH: 27.9 pg (ref 26.0–34.0)
MCHC: 32.1 g/dL (ref 30.0–36.0)
MCV: 86.8 fL (ref 80.0–100.0)
Monocytes Absolute: 0.3 10*3/uL (ref 0.1–1.0)
Monocytes Relative: 9 %
Neutro Abs: 2.1 10*3/uL (ref 1.7–7.7)
Neutrophils Relative %: 57 %
Platelets: 226 10*3/uL (ref 150–400)
RBC: 4.84 MIL/uL (ref 3.87–5.11)
RDW: 14.9 % (ref 11.5–15.5)
WBC: 3.6 10*3/uL — ABNORMAL LOW (ref 4.0–10.5)
nRBC: 0 % (ref 0.0–0.2)

## 2021-09-19 LAB — LIPID PANEL
Cholesterol: 338 mg/dL — ABNORMAL HIGH (ref 0–200)
HDL: 81 mg/dL (ref >40)
LDL Cholesterol: 248 mg/dL — ABNORMAL HIGH (ref 0–99)
Total CHOL/HDL Ratio: 4.2 RATIO
Triglycerides: 44 mg/dL (ref <150)
VLDL: 9 mg/dL (ref 0–40)

## 2021-09-19 LAB — POCT URINALYSIS DIPSTICK
Bilirubin, UA: NEGATIVE
Blood, UA: NEGATIVE
Glucose, UA: NEGATIVE
Ketones, UA: NEGATIVE
Leukocytes, UA: NEGATIVE
Nitrite, UA: NEGATIVE
Protein, UA: NEGATIVE
Spec Grav, UA: 1.015 (ref 1.010–1.025)
Urobilinogen, UA: 0.2 E.U./dL
pH, UA: 6 (ref 5.0–8.0)

## 2021-09-19 LAB — HEMOGLOBIN A1C
Hgb A1c MFr Bld: 5.5 % (ref 4.8–5.6)
Mean Plasma Glucose: 111.15 mg/dL

## 2021-09-19 LAB — TSH: TSH: 2.014 u[IU]/mL (ref 0.350–4.500)

## 2021-09-19 MED ORDER — NA SULFATE-K SULFATE-MG SULF 17.5-3.13-1.6 GM/177ML PO SOLN: 1.0000 | Freq: Once | ORAL | 0 refills | Status: AC

## 2021-09-19 NOTE — Progress Notes (Signed)
? ? ?Date:  09/19/2021  ? ?Name:  Madeline Moon   DOB:  [birth year]   MRN:  [id number] ? ? ?Chief Complaint: Annual Exam ?Madeline Moon is a 61 y.o. female who presents today for her Complete Annual Exam. She feels well. She reports exercising. She reports she is sleeping well. Breast complaints - none. ?UTI symptoms have resolved.  She still notices a mass in the vaginal area - worse when standing. ?She also had Dupuytren contractures of both palms. ? ?Mammogram: 11/2020 left breast mass? F/u negative ?DEXA: none ?Pap smear: 10/2018 neg with co-testing ?Colonoscopy: 2013 - due ? ?There are no preventive care reminders to display for this patient. ?  ?Immunization History  ?Administered Date(s) Administered  ? Influenza,inj,Quad PF,6+ Mos 04/05/2016  ? Tdap 08/10/2015  ? Zoster Recombinat (Shingrix) 10/06/2018, 01/14/2019  ? ? ?HPI ? ?Lab Results  ?Component Value Date  ? NA 139 10/09/2020  ? K 4.7 10/09/2020  ? CO2 23 10/09/2020  ? GLUCOSE 112 (H) 10/09/2020  ? BUN 15 10/09/2020  ? CREATININE 0.82 10/09/2020  ? CALCIUM 9.1 10/09/2020  ? EGFR 82 10/09/2020  ? GFRNONAA 77 12/13/2019  ? ?Lab Results  ?Component Value Date  ? CHOL 218 (H) 10/09/2020  ? HDL 56 10/09/2020  ? LDLCALC 139 (H) 10/09/2020  ? TRIG 130 10/09/2020  ? CHOLHDL 3.9 10/09/2020  ? ?Lab Results  ?Component Value Date  ? TSH 1.900 10/09/2020  ? ?Lab Results  ?Component Value Date  ? HGBA1C 6.1 (H) 10/09/2020  ? ?Lab Results  ?Component Value Date  ? WBC 4.0 10/09/2020  ? HGB 13.4 10/09/2020  ? HCT 41.0 10/09/2020  ? MCV 85 10/09/2020  ? PLT 267 10/09/2020  ? ?Lab Results  ?Component Value Date  ? ALT 13 10/09/2020  ? AST 16 10/09/2020  ? ALKPHOS 100 10/09/2020  ? BILITOT 0.4 10/09/2020  ? ?No results found for: 25OHVITD2, [place], VD25OH  ? ?Review of Systems  ?Constitutional:  Negative for chills, fatigue and fever.  ?HENT:  Positive for ear pain and tinnitus. Negative for congestion, hearing loss, trouble swallowing and voice change.    ?Eyes:  Negative for visual disturbance.  ?Respiratory:  Negative for cough, chest tightness, shortness of breath and wheezing.   ?Cardiovascular:  Negative for chest pain, palpitations and leg swelling.  ?Gastrointestinal:  Negative for abdominal pain, constipation, diarrhea and vomiting.  ?Endocrine: Negative for polydipsia and polyuria.  ?Genitourinary:  Negative for dysuria, frequency, genital sores, vaginal bleeding and vaginal discharge.  ?     Vaginal mass  ?Musculoskeletal:  Positive for myalgias (contractures of both palms). Negative for arthralgias, gait problem and joint swelling.  ?Skin:  Negative for color change and rash.  ?Neurological:  Positive for dizziness. Negative for tremors, light-headedness and headaches.  ?     Balance issues  ?Hematological:  Negative for adenopathy. Does not bruise/bleed easily.  ?Psychiatric/Behavioral:  Negative for dysphoric mood and sleep disturbance. The patient is not nervous/anxious.   ? ?Patient Active Problem List  ? Diagnosis Date Noted  ? Prediabetes 10/08/2020  ? Chronic right shoulder pain 02/28/2020  ? Elevated blood pressure reading 10/07/2019  ? Gastroesophageal reflux disease without esophagitis 10/06/2018  ? Palmar fascial fibromatosis 10/06/2018  ? Hyperlipidemia, mild 08/12/2016  ? Back muscle spasm 12/16/2014  ? Mixed urge and stress incontinence 11/09/2014  ? Osteoarthritis 11/09/2014  ? ? ?No Known Allergies ? ?Past Surgical History:  ?Procedure Laterality Date  ? RIGHT OOPHORECTOMY    ?  TUBAL LIGATION    ? ? ?Social History  ? ?Tobacco Use  ? Smoking status: Never  ? Smokeless tobacco: Never  ?Vaping Use  ? Vaping Use: Never used  ?Substance Use Topics  ? Alcohol use: Yes  ?  Alcohol/week: 2.0 standard drinks  ?  Types: 2 Standard drinks or equivalent per week  ? Drug use: No  ? ? ? ?Medication list has been reviewed and updated. ? ?No outpatient medications have been marked as taking for the 09/19/21 encounter (Office Visit) with Glean Hess,  MD.  ? ? ? ?  09/19/2021  ? 10:04 AM 08/06/2021  ?  3:38 PM 05/15/2021  ?  3:58 PM 10/09/2020  ?  8:11 AM  ?GAD 7 : Generalized Anxiety Score  ?Nervous, Anxious, on Edge 0 0 0 0  ?Control/stop worrying 0 0 0 0  ?Worry too much - different things 0 0 0 0  ?Trouble relaxing 0 0 0 0  ?Restless 0 0 0 0  ?Easily annoyed or irritable 0 0 0 0  ?Afraid - awful might happen 0 0 0 0  ?Total GAD 7 Score 0 0 0 0  ?Anxiety Difficulty Not difficult at all  Not difficult at all   ? ? ? ?  09/19/2021  ? 10:04 AM  ?Depression screen PHQ 2/9  ?Decreased Interest 0  ?Down, Depressed, Hopeless 0  ?PHQ - 2 Score 0  ?Altered sleeping 0  ?Tired, decreased energy 0  ?Change in appetite 0  ?Feeling bad or failure about yourself  0  ?Trouble concentrating 0  ?Moving slowly or fidgety/restless 0  ?Suicidal thoughts 0  ?PHQ-9 Score 0  ?Difficult doing work/chores Not difficult at all  ? ? ?BP Readings from Last 3 Encounters:  ?09/19/21 108/78  ?08/06/21 110/72  ?05/15/21 122/78  ? ?SDOH Interventions   ? ?Flowsheet Row Most Recent Value  ?SDOH Interventions   ?Financial Strain Interventions Intervention Not Indicated  ?Housing Interventions Intervention Not Indicated  ?Intimate Partner Violence Interventions Intervention Not Indicated  ?Physical Activity Interventions Patient Refused  ?Stress Interventions Intervention Not Indicated  ?Social Connections Interventions Intervention Not Indicated  ?Transportation Interventions Intervention Not Indicated  ? ?  ?  ? ?Physical Exam ?Vitals and nursing note reviewed.  ?Constitutional:   ?   General: She is not in acute distress. ?   Appearance: She is well-developed.  ?HENT:  ?   Head: Normocephalic and atraumatic.  ?   Right Ear: Tympanic membrane and ear canal normal.  ?   Left Ear: Tympanic membrane and ear canal normal.  ?   Nose:  ?   Right Sinus: No maxillary sinus tenderness.  ?   Left Sinus: No maxillary sinus tenderness.  ?Eyes:  ?   General: No scleral icterus.    ?   Right eye: No discharge.      ?   Left eye: No discharge.  ?   Conjunctiva/sclera: Conjunctivae normal.  ?Neck:  ?   Thyroid: No thyromegaly.  ?   Vascular: No carotid bruit.  ?Cardiovascular:  ?   Rate and Rhythm: Normal rate and regular rhythm.  ?   Pulses: Normal pulses.  ?   Heart sounds: Normal heart sounds.  ?Pulmonary:  ?   Effort: Pulmonary effort is normal. No respiratory distress.  ?   Breath sounds: No wheezing.  ?Chest:  ?Breasts: ?   Right: No mass, nipple discharge, skin change or tenderness.  ?   Left: No mass, nipple discharge,  skin change or tenderness.  ?Abdominal:  ?   General: Bowel sounds are normal.  ?   Palpations: Abdomen is soft.  ?   Tenderness: There is no abdominal tenderness.  ?Genitourinary: ?   Labia:     ?   Right: No tenderness, lesion or injury.     ?   Left: No tenderness, lesion or injury.   ?   Urethra: No prolapse or urethral lesion.  ?   Vagina: Prolapsed vaginal walls present.  ?   Uterus: Normal.   ?   Adnexa: Right adnexa normal and left adnexa normal.  ?   Comments: Pap not obtained ?Musculoskeletal:  ?   Cervical back: Normal range of motion. No erythema.  ?   Right lower leg: No edema.  ?   Left lower leg: No edema.  ?   Comments: Contractures of both palms base of middle fingers  ?Lymphadenopathy:  ?   Cervical: No cervical adenopathy.  ?Skin: ?   General: Skin is warm and dry.  ?   Findings: No rash.  ?Neurological:  ?   Mental Status: She is alert and oriented to person, place, and time.  ?   Cranial Nerves: No cranial nerve deficit.  ?   Sensory: No sensory deficit.  ?   Deep Tendon Reflexes: Reflexes are normal and symmetric.  ?Psychiatric:     ?   Attention and Perception: Attention normal.     ?   Mood and Affect: Mood normal.  ? ? ?Wt Readings from Last 3 Encounters:  ?09/19/21 143 lb (64.9 kg)  ?08/06/21 142 lb (64.4 kg)  ?05/15/21 190 lb (86.2 kg)  ? ? ?BP 108/78   Pulse 63   Ht 5' 5"  (1.651 m)   Wt 143 lb (64.9 kg)   SpO2 99%   BMI 23.80 kg/m?  ? ?Assessment and Plan: ?1. Annual  physical exam ?Normal exam. ?Continue Keto diet and regular exercise. ?- CBC with Differential/Platelet ?- Comprehensive metabolic panel ?- TSH ? ?2. Encounter for screening mammogram for breast cancer ?Schedule

## 2021-09-19 NOTE — Progress Notes (Signed)
Gastroenterology Pre-Procedure Review ? ?Request Date: 10/08/21 ?Requesting Physician: Dr. Allen Norris ? ?PATIENT REVIEW QUESTIONS: The patient responded to the following health history questions as indicated:   ? ?1. Are you having any GI issues? no ?2. Do you have a personal history of Polyps? no ?3. Do you have a family history of Colon Cancer or Polyps? yes (mother diverticulitis, grandfather colon cancer) ?4. Diabetes Mellitus? no ?5. Joint replacements in the past 12 months?no ?6. Major health problems in the past 3 months?no ?7. Any artificial heart valves, MVP, or defibrillator?no ?   ?MEDICATIONS & ALLERGIES:    ?Patient reports the following regarding taking any anticoagulation/antiplatelet therapy:   ?Plavix, Coumadin, Eliquis, Xarelto, Lovenox, Pradaxa, Brilinta, or Effient? no ?Aspirin? no ? ?Patient confirms/reports the following medications:  ?No current outpatient medications on file.  ? ?No current facility-administered medications for this visit.  ? ? ?Patient confirms/reports the following allergies:  ?No Known Allergies ? ?No orders of the defined types were placed in this encounter. ? ? ?AUTHORIZATION INFORMATION ?Primary Insurance: ?1D#: ?Group #: ? ?Secondary Insurance: ?1D#: ?Group #: ? ?SCHEDULE INFORMATION: ?Date: 10/08/21 ?Time: ?Location: [place] ?

## 2021-09-26 ENCOUNTER — Other Ambulatory Visit: Payer: Self-pay

## 2021-09-26 DIAGNOSIS — N8110 Cystocele, unspecified: Secondary | ICD-10-CM

## 2021-09-27 ENCOUNTER — Telehealth: Payer: Self-pay

## 2021-09-27 NOTE — Telephone Encounter (Signed)
Called pt left VM to call back. ? ?Called as a reminder to call and schedule mammogram. P: [phone] ? ?PEC nurse may give results to patient if they return call to clinic, a CRM has been created. ? ?KP ?

## 2021-09-27 NOTE — Telephone Encounter (Signed)
Pt called back. Gave her message to schedule mammogram. Gave number but she had it at home. Pt verbalized understanding. ?

## 2021-10-01 ENCOUNTER — Encounter: Payer: Self-pay | Admitting: *Deleted

## 2021-10-08 ENCOUNTER — Encounter
Admission: RE | Disposition: A | Discharge status: Home / Self Care | Payer: Self-pay | Source: Home / Self Care | Attending: Gastroenterology

## 2021-10-08 ENCOUNTER — Ambulatory Visit: Payer: 59 | Admitting: Anesthesiology

## 2021-10-08 ENCOUNTER — Encounter: Payer: Self-pay | Admitting: Gastroenterology

## 2021-10-08 ENCOUNTER — Ambulatory Visit
Admission: RE | Admit: 2021-10-08 | Discharge: 2021-10-08 | Disposition: A | Discharge status: Home / Self Care | Payer: 59 | Attending: Gastroenterology | Admitting: Gastroenterology

## 2021-10-08 ENCOUNTER — Other Ambulatory Visit: Payer: Self-pay

## 2021-10-08 DIAGNOSIS — K648 Other hemorrhoids: Secondary | ICD-10-CM | POA: Insufficient documentation

## 2021-10-08 DIAGNOSIS — Z1211 Encounter for screening for malignant neoplasm of colon: Secondary | ICD-10-CM | POA: Diagnosis not present

## 2021-10-08 DIAGNOSIS — K5730 Diverticulosis of large intestine without perforation or abscess without bleeding: Secondary | ICD-10-CM | POA: Insufficient documentation

## 2021-10-08 HISTORY — PX: COLONOSCOPY WITH PROPOFOL: SHX5780

## 2021-10-08 SURGERY — COLONOSCOPY WITH PROPOFOL
Anesthesia: General | Site: Rectum

## 2021-10-08 MED ORDER — LACTATED RINGERS IV SOLN: INTRAVENOUS | Status: DC

## 2021-10-08 MED ORDER — SODIUM CHLORIDE 0.9 % IV SOLN: INTRAVENOUS | Status: DC

## 2021-10-08 MED ORDER — LIDOCAINE HCL (CARDIAC) PF 100 MG/5ML IV SOSY
PREFILLED_SYRINGE | INTRAVENOUS | Status: DC | PRN
Start: 2021-10-08 — End: 2021-10-08
  Administered 2021-10-08: 30 mg via INTRAVENOUS

## 2021-10-08 MED ORDER — SIMETHICONE 40 MG/0.6ML PO SUSP
ORAL | Status: DC | PRN
  Administered 2021-10-08: .05 mL

## 2021-10-08 MED ORDER — PROPOFOL 10 MG/ML IV BOLUS
INTRAVENOUS | Status: DC | PRN
  Administered 2021-10-08: 20 mg via INTRAVENOUS
  Administered 2021-10-08: 30 mg via INTRAVENOUS
  Administered 2021-10-08: 20 mg via INTRAVENOUS
  Administered 2021-10-08: 30 mg via INTRAVENOUS
  Administered 2021-10-08: 100 mg via INTRAVENOUS

## 2021-10-08 MED ORDER — STERILE WATER FOR IRRIGATION IR SOLN
Status: DC | PRN
  Administered 2021-10-08: 250 mL

## 2021-10-08 SURGICAL SUPPLY — 6 items
GOWN CVR UNV OPN BCK APRN NK (MISCELLANEOUS) ×2 IMPLANT
GOWN ISOL THUMB LOOP REG UNIV (MISCELLANEOUS) ×4
KIT PRC NS LF DISP ENDO (KITS) ×1 IMPLANT
KIT PROCEDURE OLYMPUS (KITS) ×2
MANIFOLD NEPTUNE II (INSTRUMENTS) ×2 IMPLANT
WATER STERILE IRR 250ML POUR (IV SOLUTION) ×2 IMPLANT

## 2021-10-08 NOTE — Anesthesia Preprocedure Evaluation (Signed)
Anesthesia Evaluation  ?Patient identified by MRN, date of birth, ID band ?Patient awake ? ? ? ?Reviewed: ?Allergy & Precautions, H&P , NPO status , Patient's Chart, lab work & pertinent test results, reviewed documented beta blocker date and time  ? ?Airway ?Mallampati: II ? ?TM Distance: >3 FB ?Neck ROM: full ? ? ? Dental ?no notable dental hx. ? ?  ?Pulmonary ?neg pulmonary ROS,  ?  ?Pulmonary exam normal ?breath sounds clear to auscultation ? ? ? ? ? ? Cardiovascular ?Exercise Tolerance: Good ?negative cardio ROS ?Normal cardiovascular exam ?Rhythm:regular Rate:Normal ? ? ?  ?Neuro/Psych ?negative neurological ROS ? negative psych ROS  ? GI/Hepatic ?negative GI ROS, Neg liver ROS, Controlled,  ?Endo/Other  ?negative endocrine ROS ? Renal/GU ?negative Renal ROS  ?negative genitourinary ?  ?Musculoskeletal ? ? Abdominal ?  ?Peds ? Hematology ?negative hematology ROS ?(+)   ?Anesthesia Other Findings ? ? Reproductive/Obstetrics ?negative OB ROS ? ?  ? ? ? ? ? ? ? ? ? ? ? ? ? ?  ?  ? ? ? ? ? ? ? ? ?Anesthesia Physical ?Anesthesia Plan ? ?ASA: 1 ? ?Anesthesia Plan: General  ? ?Post-op Pain Management:   ? ?Induction:  ? ?PONV Risk Score and Plan:  ? ?Airway Management Planned:  ? ?Additional Equipment:  ? ?Intra-op Plan:  ? ?Post-operative Plan:  ? ?Informed Consent: I have reviewed the patients History and Physical, chart, labs and discussed the procedure including the risks, benefits and alternatives for the proposed anesthesia with the patient or authorized representative who has indicated his/her understanding and acceptance.  ? ? ? ?Dental Advisory Given ? ?Plan Discussed with: CRNA and Anesthesiologist ? ?Anesthesia Plan Comments:   ? ? ? ? ? ? ?Anesthesia Quick Evaluation ? ?

## 2021-10-08 NOTE — Op Note (Signed)
[facility] ?Gastroenterology ?Patient Name: Madeline Moon ?Procedure Date: 10/08/2021 9:09 AM ?MRN: [id number] ?Account #: [id number] ?Date of Birth: [birth year] ?Admit Type: Outpatient ?Age: 61 ?Room: [facility] OR ROOM 01 ?Gender: Female ?Note Status: Finalized ?Instrument Name: [id number] ?Procedure:             Colonoscopy ?Indications:           Screening for colorectal malignant neoplasm ?Providers:             Lucilla Lame MD, MD ?Referring MD:          Halina Maidens, MD (Referring MD) ?Medicines:             Propofol per Anesthesia ?Complications:         No immediate complications. ?Procedure:             Pre-Anesthesia Assessment: ?                       - Prior to the procedure, a History and Physical was  ?                       performed, and patient medications and allergies were  ?                       reviewed. The patient's tolerance of previous  ?                       anesthesia was also reviewed. The risks and benefits  ?                       of the procedure and the sedation options and risks  ?                       were discussed with the patient. All questions were  ?                       answered, and informed consent was obtained. Prior  ?                       Anticoagulants: The patient has taken no previous  ?                       anticoagulant or antiplatelet agents. ASA Grade  ?                       Assessment: II - A patient with mild systemic disease.  ?                       After reviewing the risks and benefits, the patient  ?                       was deemed in satisfactory condition to undergo the  ?                       procedure. ?                       After obtaining informed consent, the colonoscope was  ?  passed under direct vision. Throughout the procedure,  ?                       the patient's blood pressure, pulse, and oxygen  ?                       saturations were monitored continuously. The  ?                       Colonoscope  was introduced through the anus and  ?                       advanced to the the cecum, identified by appendiceal  ?                       orifice and ileocecal valve. The colonoscopy was  ?                       performed without difficulty. The patient tolerated  ?                       the procedure well. The quality of the bowel  ?                       preparation was excellent. ?Findings: ?     The perianal and digital rectal examinations were normal. ?     Multiple small-mouthed diverticula were found in the sigmoid colon. ?     Non-bleeding internal hemorrhoids were found during retroflexion. The  ?     hemorrhoids were Grade II (internal hemorrhoids that prolapse but reduce  ?     spontaneously). ?Impression:            - Diverticulosis in the sigmoid colon. ?                       - Non-bleeding internal hemorrhoids. ?                       - No specimens collected. ?Recommendation:        - Discharge patient to home. ?                       - Resume previous diet. ?                       - Continue present medications. ?                       - Repeat colonoscopy in 10 years for screening  ?                       purposes. ?Procedure Code(s):     --- Professional --- ?                       [id number], Colonoscopy, flexible; diagnostic, including  ?                       collection of specimen(s) by brushing or washing, when  ?  performed (separate procedure) ?Diagnosis Code(s):     --- Professional --- ?                       Z12.11, Encounter for screening for malignant neoplasm  ?                       of colon ?CPT copyright 2019 American Medical Association. All rights reserved. ?The codes documented in this report are preliminary and upon coder review may  ?be revised to meet current compliance requirements. ?Lucilla Lame MD, MD ?10/08/2021 9:33:49 AM ?This report has been signed electronically. ?Number of Addenda: 0 ?Note Initiated On: 10/08/2021 9:09 AM ?Scope Withdrawal Time: 0 hours 7  minutes 8 seconds  ?Total Procedure Duration: 0 hours 11 minutes 59 seconds  ?Estimated Blood Loss:  Estimated blood loss: none. ?     [facility] ?

## 2021-10-08 NOTE — Anesthesia Postprocedure Evaluation (Signed)
Anesthesia Post Note ? ?Patient: Madeline Moon ? ?Procedure(s) Performed: COLONOSCOPY WITH PROPOFOL (Rectum) ? ? ?  ?Patient location during evaluation: PACU ?Anesthesia Type: General ?Level of consciousness: awake and alert ?Pain management: pain level controlled ?Vital Signs Assessment: post-procedure vital signs reviewed and stable ?Respiratory status: spontaneous breathing, nonlabored ventilation, respiratory function stable and patient connected to nasal cannula oxygen ?Cardiovascular status: blood pressure returned to baseline and stable ?Postop Assessment: no apparent nausea or vomiting ?Anesthetic complications: no ? ? ?No notable events documented. ? ?[place] [place] ? ? ? ? ? ?

## 2021-10-08 NOTE — Transfer of Care (Signed)
Immediate Anesthesia Transfer of Care Note ? ?Patient: Madeline Moon ? ?Procedure(s) Performed: COLONOSCOPY WITH PROPOFOL (Rectum) ? ?Patient Location: PACU ? ?Anesthesia Type: General ? ?Level of Consciousness: awake, alert  and patient cooperative ? ?Airway and Oxygen Therapy: Patient Spontanous Breathing and Patient connected to supplemental oxygen ? ?Post-op Assessment: Post-op Vital signs reviewed, Patient's Cardiovascular Status Stable, Respiratory Function Stable, Patent Airway and No signs of Nausea or vomiting ? ?Post-op Vital Signs: Reviewed and stable ? ?Complications: No notable events documented. ? ?

## 2021-10-08 NOTE — H&P (Signed)
? ?Lucilla Lame, MD [facility] ?[address] ?[place], [place] [address] ?Phone: [phone] ?Fax : [phone] ? ?Primary Care Physician:  Glean Hess, MD ?Primary Gastroenterologist:  Dr. Allen Norris ? ?Pre-Procedure History & Physical: ?HPI:  Madeline Moon is a 61 y.o. female is here for a screening colonoscopy.  ? ?Past Medical History:  ?Diagnosis Date  ? Depression, major, single episode, complete remission ([place]) 12/16/2014  ? ? ?Past Surgical History:  ?Procedure Laterality Date  ? RIGHT OOPHORECTOMY    ? TUBAL LIGATION    ? ? ?Prior to Admission medications   ?Medication Sig Start Date End Date Taking? Authorizing Provider  ?NON FORMULARY Sodium, potassium and magnesium solution.  15 drops in AM coffee   Yes [provider]  ?VITAMIN D PO Take by mouth.   Yes [provider]  ? ? ?Allergies as of 09/19/2021  ? (No Known Allergies)  ? ? ?Family History  ?Problem Relation Age of Onset  ? Dementia Mother   ? CAD Father   ? Dementia Father   ? AAA (abdominal aortic aneurysm) Father   ? Thyroid nodules Father   ? Colon cancer Paternal Grandfather 29  ? AAA (abdominal aortic aneurysm) Brother   ? Thyroid nodules Paternal Grandmother   ? Breast cancer Neg Hx   ? ? ?Social History  ? ?Socioeconomic History  ? Marital status: Married  ?  Spouse name: Not on file  ? Number of children: Not on file  ? Years of education: Not on file  ? Highest education level: Not on file  ?Occupational History  ? Not on file  ?Tobacco Use  ? Smoking status: Never  ? Smokeless tobacco: Never  ?Vaping Use  ? Vaping Use: Never used  ?Substance and Sexual Activity  ? Alcohol use: Yes  ?  Alcohol/week: 2.0 standard drinks  ?  Types: 2 Standard drinks or equivalent per week  ?  Comment: occasional  ? Drug use: No  ? Sexual activity: Yes  ?Other Topics Concern  ? Not on file  ?Social History Narrative  ? Not on file  ? ?Social Determinants of Health  ? ?Financial Resource Strain: Low Risk   ? Difficulty of Paying  Living Expenses: Not hard at all  ?Food Insecurity: Not on file  ?Transportation Needs: No Transportation Needs  ? Lack of Transportation (Medical): No  ? Lack of Transportation (Non-Medical): No  ?Physical Activity: Insufficiently Active  ? Days of Exercise per Week: 1 day  ? Minutes of Exercise per Session: 20 min  ?Stress: No Stress Concern Present  ? Feeling of Stress : Only a little  ?Social Connections: Socially Integrated  ? Frequency of Communication with Friends and Family: Twice a week  ? Frequency of Social Gatherings with Friends and Family: Three times a week  ? Attends Religious Services: More than 4 times per year  ? Active Member of Clubs or Organizations: Yes  ? Attends Archivist Meetings: More than 4 times per year  ? Marital Status: Married  ?Intimate Partner Violence: Not At Risk  ? Fear of Current or Ex-Partner: No  ? Emotionally Abused: No  ? Physically Abused: No  ? Sexually Abused: No  ? ? ?Review of Systems: ?See HPI, otherwise negative ROS ? ?Physical Exam: ?BP 101/77   Pulse 62   Temp (!) 97.5 ?F (36.4 ?C) (Temporal)   Resp 10   Ht 5\' 5"  (1.651 m)   Wt 62.1 kg   SpO2 100%  BMI 22.80 kg/m?  ?General:   Alert,  pleasant and cooperative in NAD ?Head:  Normocephalic and atraumatic. ?Neck:  Supple; no masses or thyromegaly. ?Lungs:  Clear throughout to auscultation.    ?Heart:  Regular rate and rhythm. ?Abdomen:  Soft, nontender and nondistended. Normal bowel sounds, without guarding, and without rebound.   ?Neurologic:  Alert and  oriented x4;  grossly normal neurologically. ? ?Impression/Plan: ?Madeline Moon is now here to undergo a screening colonoscopy. ? ?Risks, benefits, and alternatives regarding colonoscopy have been reviewed with the patient.  Questions have been answered.  All parties agreeable. ?

## 2021-10-08 NOTE — Anesthesia Procedure Notes (Signed)
Procedure Name: [place] ?Date/Time: 10/08/2021 9:13 AM ?Performed by: Cameron Ali, CRNA ?Pre-anesthesia Checklist: Patient identified, Emergency Drugs available, Suction available, Timeout performed and Patient being monitored ?Patient Re-evaluated:Patient Re-evaluated prior to induction ?Oxygen Delivery Method: Nasal cannula ?Placement Confirmation: positive ETCO2 ? ? ? ? ?

## 2021-10-09 ENCOUNTER — Encounter: Payer: Self-pay | Admitting: Gastroenterology

## 2021-11-15 ENCOUNTER — Ambulatory Visit
Admission: RE | Admit: 2021-11-15 | Discharge: 2021-11-15 | Disposition: A | Discharge status: Home / Self Care | Payer: 59 | Source: Ambulatory Visit | Attending: Internal Medicine | Admitting: Internal Medicine

## 2021-11-15 DIAGNOSIS — Z1231 Encounter for screening mammogram for malignant neoplasm of breast: Secondary | ICD-10-CM | POA: Insufficient documentation

## 2022-02-13 ENCOUNTER — Ambulatory Visit (INDEPENDENT_AMBULATORY_CARE_PROVIDER_SITE_OTHER): Payer: 59 | Admitting: Obstetrics and Gynecology

## 2022-02-13 ENCOUNTER — Encounter: Payer: Self-pay | Admitting: Obstetrics and Gynecology

## 2022-02-13 VITALS — BP 124/79 | HR 74 | Ht 63.0 in | Wt 151.0 lb

## 2022-02-13 DIAGNOSIS — R350 Frequency of micturition: Secondary | ICD-10-CM

## 2022-02-13 DIAGNOSIS — N8110 Cystocele, unspecified: Secondary | ICD-10-CM

## 2022-02-13 DIAGNOSIS — N816 Rectocele: Secondary | ICD-10-CM

## 2022-02-13 DIAGNOSIS — N812 Incomplete uterovaginal prolapse: Secondary | ICD-10-CM | POA: Diagnosis not present

## 2022-02-13 DIAGNOSIS — N3281 Overactive bladder: Secondary | ICD-10-CM | POA: Diagnosis not present

## 2022-02-13 DIAGNOSIS — N393 Stress incontinence (female) (male): Secondary | ICD-10-CM | POA: Diagnosis not present

## 2022-02-13 LAB — POCT URINALYSIS DIPSTICK
Bilirubin, UA: NEGATIVE
Blood, UA: NEGATIVE
Glucose, UA: NEGATIVE
Ketones, UA: NEGATIVE
Leukocytes, UA: NEGATIVE
Nitrite, UA: NEGATIVE
Protein, UA: NEGATIVE
Spec Grav, UA: >=1.03 — AB (ref 1.010–1.025)
Urobilinogen, UA: 0.2 E.U./dL
pH, UA: 5.5 (ref 5.0–8.0)

## 2022-02-13 NOTE — Progress Notes (Signed)
[facility] New Patient Evaluation and Consultation  Referring Provider: Glean Hess, MD PCP: Glean Hess, MD Date of Service: 02/13/2022  SUBJECTIVE Chief Complaint: New Patient (Initial Visit) Madeline Moon is a 61 y.o. female here for a consult for a prolapse. )  History of Present Illness: Madeline Moon is a 60 y.o. White or Caucasian female seen in consultation at the request of Dr. Army Melia for evaluation of prolapse.    Review of records significant for: Prolapse noted on vaginal exam. Was treated for UTI in April.   Urinary Symptoms: Leaks urine with cough/ sneeze, laughing, exercise, lifting, going from sitting to standing, during sex, with a full bladder, with movement to the bathroom, and with urgency. UUI > SUI Leaks 8 time(s) per day.  Pad use: 8 liners/ mini-pads per day.   She is bothered by her UI symptoms.  Day time voids 10.  Nocturia: 3 times per night to void. Voiding dysfunction: she does not empty her bladder well.  does not use a catheter to empty bladder.  When urinating, she feels dribbling after finishing and to push on her belly or vagina to empty bladder Drinks: about 20oz coffee, diet soda, green tea, a few bottles (16oz) water per day.  Drinks until 6-7 pm and goes to bed around 9:30.   UTIs: 1 UTI's in the last year.   Denies history of blood in urine and kidney or bladder stones  Pelvic Organ Prolapse Symptoms:                  She Admits to a feeling of a bulge the vaginal area. It has been present for 2 years.  She Admits to seeing a bulge.  This bulge is bothersome. Has lost 65 lbs and can feel the prolapse more now.   Bowel Symptom: Bowel movements: regular- daily Stool consistency: soft  Straining: no.  Splinting: no.  Incomplete evacuation: no.  She Denies accidental bowel leakage / fecal incontinence Bowel regimen: none   Sexual Function Sexually active: no.    Pelvic Pain Admits to  pelvic pain Location: "sores on the surface" Pain occurs: all the time Prior pain treatment: powder Improved by: washing Worsened by: wet pads   Past Medical History:  Past Medical History:  Diagnosis Date   Depression, major, single episode, complete remission ([place]) 12/16/2014     Past Surgical History:   Past Surgical History:  Procedure Laterality Date   COLONOSCOPY WITH PROPOFOL N/A 10/08/2021   Procedure: COLONOSCOPY WITH PROPOFOL;  Surgeon: Lucilla Lame, MD;  Location: [facility];  Service: Endoscopy;  Laterality: N/A;   RIGHT OOPHORECTOMY     ? patient does not recall this surgery   TUBAL LIGATION       Past OB/GYN History: OB History  Gravida Para Term Preterm AB Living  3         2  SAB IAB Ectopic Multiple Live Births          3    # Outcome Date GA Lbr Len/2nd Weight Sex Delivery Anes PTL Lv  3 Gravida           2 Gravida           1 Gravida             Vaginal deliveries: 3,  Forceps/ Vacuum deliveries: 0, Cesarean section: 0 Menopausal: Yes, Denies vaginal bleeding since menopause  Contraception: n/a Last pap smear was 10/2018- negative.  Has a  history of cryoablation.    Medications: She has a current medication list which includes the following prescription(s): NON FORMULARY and vitamin d.   Allergies: Patient has No Known Allergies.   Social History:  Social History   Tobacco Use   Smoking status: Never   Smokeless tobacco: Never  Vaping Use   Vaping Use: Never used  Substance Use Topics   Alcohol use: Yes    Alcohol/week: 2.0 standard drinks of alcohol    Types: 2 Standard drinks or equivalent per week    Comment: occasional   Drug use: No    Relationship status: married She lives with spouse.   She is employed. Regular exercise: No History of abuse: No  Family History:   Family History  Problem Relation Age of Onset   Dementia Mother    CAD Father    AAA (abdominal aortic aneurysm) Father    Thyroid nodules Father     AAA (abdominal aortic aneurysm) Brother    Thyroid nodules Paternal Grandmother    Colon cancer Paternal Grandfather 68   Breast cancer Neg Hx      Review of Systems: Review of Systems  Constitutional:  Negative for fever, malaise/fatigue and weight loss.  Respiratory:  Negative for cough, shortness of breath and wheezing.   Cardiovascular:  Negative for chest pain, palpitations and leg swelling.  Gastrointestinal:  Negative for abdominal pain and blood in stool.  Genitourinary:  Negative for dysuria.  Musculoskeletal:  Positive for myalgias.  Skin:  Negative for rash.  Neurological:  Positive for dizziness. Negative for headaches.  Endo/Heme/Allergies:  Does not bruise/bleed easily.  Psychiatric/Behavioral:  Negative for depression. The patient is not nervous/anxious.      OBJECTIVE Physical Exam: Vitals:   02/13/22 1519  BP: 124/79  Pulse: 74  Weight: 151 lb (68.5 kg)  Height: 5\' 3"  (1.6 m)    Physical Exam Constitutional:      General: She is not in acute distress. Pulmonary:     Effort: Pulmonary effort is normal.  Abdominal:     General: There is no distension.     Palpations: Abdomen is soft.     Tenderness: There is no abdominal tenderness. There is no rebound.  Musculoskeletal:        General: No swelling. Normal range of motion.  Skin:    General: Skin is warm and dry.     Findings: No rash.  Neurological:     Mental Status: She is alert and oriented to person, place, and time.  Psychiatric:        Mood and Affect: Mood normal.        Behavior: Behavior normal.      GU / Detailed Urogynecologic Evaluation:  Pelvic Exam: Normal external female genitalia; Bartholin's and Skene's glands normal in appearance; urethral meatus normal in appearance, no urethral masses or discharge.   CST: negative  Speculum exam reveals normal vaginal mucosa with atrophy. Cervix normal appearance. Uterus normal single, nontender. Adnexa no mass, fullness, tenderness.      Pelvic floor strength III/V  Pelvic floor musculature: Right levator non-tender, Right obturator non-tender, Left levator non-tender, Left obturator non-tender  POP-Q:   POP-Q  0                                            Aa   0  Ba  -4                                              C   5                                            Gh  4                                            Pb  7                                            tvl   0                                            Ap  0                                            Bp  -6.5                                              D     Rectal Exam:  Normal external rectum  Post-Void Residual (PVR) by Bladder Scan: In order to evaluate bladder emptying, we discussed obtaining a postvoid residual and she agreed to this procedure.  Procedure: The ultrasound unit was placed on the patient's abdomen in the suprapubic region after the patient had voided. A PVR of 0 ml was obtained by bladder scan.  Laboratory Results: POC urine: negative  ASSESSMENT AND PLAN Ms. Faye is a 61 y.o. with:  1. Prolapse of anterior vaginal wall   2. Prolapse of posterior vaginal wall   3. Uterovaginal prolapse, incomplete   4. Urinary frequency   5. Overactive bladder   6. SUI (stress urinary incontinence, female)    Stage II anterior, Stage II posterior, Stage I apical prolapse - For treatment of pelvic organ prolapse, we discussed options for management including expectant management, conservative management, and surgical management, such as Kegels, a pessary, pelvic floor physical therapy, and specific surgical procedures. - We discussed two options for prolapse repair:  1) vaginal repair without mesh - Pros - safer, no mesh complications - Cons - not as strong as mesh repair, higher risk of recurrence  2) laparoscopic repair with mesh - Pros - stronger, better long-term success -  Cons - risks of mesh implant (erosion into vagina or bladder, adhering to the rectum, pain) - these risks are lower than with a vaginal mesh but still exist - Handouts provided on surgical options for her to consider.   2. OAB - We discussed the symptoms of overactive bladder (OAB), which include urinary  urgency, urinary frequency, nocturia, with or without urge incontinence.  While we do not know the exact etiology of OAB, several treatment options exist. We discussed management including behavioral therapy (decreasing bladder irritants, urge suppression strategies, timed voids, bladder retraining), physical therapy, medication.  -She will work on decreasing bladder irritants- coffee, tea and soda and drink more water.   3. SUI - For treatment of stress urinary incontinence,  non-surgical options include expectant management, weight loss, physical therapy, as well as a pessary.  Surgical options include a midurethral sling, Burch urethropexy, and transurethral injection of a bulking agent. - She will undergo urodynamics. Handouts provided on surgical options.   Return for urodynamic testing   Marguerita Beards, MD

## 2022-02-13 NOTE — Patient Instructions (Addendum)
Today we talked about ways to manage bladder urgency such as altering your diet to avoid irritative beverages and foods (bladder diet) as well as attempting to decrease stress and other exacerbating factors.    The Most Bothersome Foods* The Least Bothersome Foods*  Coffee - Regular & Decaf Tea - caffeinated Carbonated beverages - cola, non-colas, diet & caffeine-free Alcohols - Beer, Red Wine, White Wine, Champagne Fruits - Grapefruit, Lemon, Orange, Pineapple Fruit Juices - Cranberry, Grapefruit, Orange, Pineapple Vegetables - Tomato & Tomato Products Flavor Enhancers - Hot peppers, Spicy foods, Chili, Horseradish, Vinegar, Monosodium glutamate (MSG) Artificial Sweeteners - NutraSweet, Sweet 'N Low, Equal (sweetener), Saccharin Ethnic foods - Mexican, Thai, Indian food Water Milk - low-fat & whole Fruits - Bananas, Blueberries, Honeydew melon, Pears, Raisins, Watermelon Vegetables - Broccoli, Brussels Sprouts, Cabbage, Carrots, Cauliflower, Celery, Cucumber, Mushrooms, Peas, Radishes, Squash, Zucchini, White potatoes, Sweet potatoes & yams Poultry - Chicken, Eggs, Turkey, Meat - Beef, Pork, Lamb Seafood - Shrimp, Tuna fish, Salmon Grains - Oat, Rice Snacks - Pretzels, Popcorn  *Friedlander J. et al. Diet and its role in interstitial cystitis/bladder pain syndrome (IC/BPS) and comorbid conditions. [employer]. [employer] Int. 2012 Jan 11.    URODYNAMICS (UDS) TEST INFORMATION  IMPORTANT: Please try to arrive with a comfortably full bladder!   What is UDS? Urodynamics is a bladder test used to evaluate how your bladder and urethra (tube you urinate out of) work to help find out the cause of your bladder symptoms and evaluate your bladder function in order to make the best treatment plan for you.   What to expect? A nurse will perform the test and will be with you during the entire exam. First we will have to empty your bladder on a special toilet.  After you have emptied your bladder,  very small catheters (plastic tubing) will be placed into your bladder and into your vagina (or rectum). These special small catheters measure pressure to help measure your bladder function.  Your bladder will be gently filled with water and you will be asked to cough and strain at several different points during the test.   You will then be asked to empty your bladder in the special toilet with the catheters in place. Most patients can urinate (pee) easily with the catheters in place since the catheters are so small. In total this procedure lasts about 45 minutes to 1 hour.  After your test is completed, you will return (or possibly be seen the same day) to review the results, talk about treatment options and make a plan moving forward.  

## 2022-02-27 ENCOUNTER — Ambulatory Visit (INDEPENDENT_AMBULATORY_CARE_PROVIDER_SITE_OTHER): Payer: 59 | Admitting: Obstetrics and Gynecology

## 2022-02-27 VITALS — BP 102/69 | HR 64 | Ht 65.0 in | Wt 151.0 lb

## 2022-02-27 DIAGNOSIS — N393 Stress incontinence (female) (male): Secondary | ICD-10-CM

## 2022-02-27 DIAGNOSIS — R350 Frequency of micturition: Secondary | ICD-10-CM

## 2022-02-27 LAB — POCT URINALYSIS DIPSTICK
Appearance: NORMAL
Bilirubin, UA: NEGATIVE
Blood, UA: NEGATIVE
Glucose, UA: NEGATIVE
Ketones, UA: NEGATIVE
Leukocytes, UA: NEGATIVE
Nitrite, UA: NEGATIVE
Protein, UA: NEGATIVE
Spec Grav, UA: 1.025 (ref 1.010–1.025)
Urobilinogen, UA: 0.2 E.U./dL
pH, UA: 5.5 (ref 5.0–8.0)

## 2022-02-27 NOTE — Patient Instructions (Signed)

## 2022-02-28 NOTE — Progress Notes (Signed)
[facility] Urodynamics Procedure  Referring Physician: Glean Hess, MD Date of Procedure: 02/27/2022  Madeline Moon is a 61 y.o. female who presents for urodynamic evaluation. Indication(s) for study: mixed incontinence  Vital Signs: BP 102/69 (BP Location: Right Arm)   Pulse 64   Ht 5\' 5"  (1.651 m)   Wt 151 lb (68.5 kg)   BMI 25.13 kg/m   Laboratory Results: A catheterized urine specimen revealed:  POC urine negative  Voiding Diary: Not performed  Procedure Timeout:  The correct patient was verified and the correct procedure was verified. The patient was in the correct position and safety precautions were reviewed based on at the patient's history.  Urodynamic Procedure A 64F dual lumen urodynamics catheter was placed under sterile conditions into the patient's bladder. A 64F catheter was placed into the rectum in order to measure abdominal pressure. EMG patches were placed in the appropriate position.  All connections were confirmed and calibrations/adjusted made. Saline was instilled into the bladder through the dual lumen catheters.  Cough/valsalva pressures were measured periodically during filling.  Patient was allowed to void.  The bladder was then emptied of its residual.  UROFLOW: Revealed a Qmax of 8 mL/sec.  She voided 60 mL and had a residual of 3 mL.  It was a normal pattern and represented normal habits though interpretation limited due to low voided volume.  CMG: This was performed with sterile water in the sitting position at a fill rate of 30 mL/min.    First sensation of fullness was 74 mLs,  First urge was 146 mLs,  Strong urge was 200 mLs and  Capacity was 387 mLs  Stress incontinence was demonstrated Highest negative Barrier CLPP was 73 cmH20 at 299 ml. Lowest positive Barrier VLPP was 36 cmH20 at 299 ml in the standing position.  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  The first occurred at 200 mL to  21.6 cm of water and was not associated with leakage.  Compliance:  End fill detrusor pressure was 11.4cmH20.  Calculated compliance was 33.28mL/cmH20  UPP: MUCP with barrier reduction was 42 cm of water.    MICTURITION STUDY: Voiding was performed with reduction using scopettes in the sitting position.  Pdet at Qmax was 30.2 cm of water.  Qmax was 8.5 mL/sec.  It was a interrupted pattern.  She voided 367.6 mL and had a residual of 20 mL.  It was a volitional void, sustained detrusor contraction was present and abdominal straining was present  EMG: This was performed with patches.  She had voluntary contractions, recruitment with fill was not present and urethral sphincter was relaxed with void.  The details of the procedure with the study tracings have been scanned into EPIC.   Urodynamic Impression:  1. Sensation was normal; capacity was normal 2. Stress Incontinence was demonstrated at normal pressures; 3. Detrusor Overactivity was demonstrated without leakage. 4. Emptying was normal with a normal PVR, a sustained detrusor contraction present,  abdominal straining present, normal urethral sphincter activity on EMG.  Plan: - The patient will follow up  to discuss the findings and treatment options.

## 2022-03-13 ENCOUNTER — Encounter: Payer: Self-pay | Admitting: Internal Medicine

## 2022-03-13 ENCOUNTER — Telehealth (INDEPENDENT_AMBULATORY_CARE_PROVIDER_SITE_OTHER): Payer: 59 | Admitting: Internal Medicine

## 2022-03-13 VITALS — Ht 63.0 in

## 2022-03-13 DIAGNOSIS — R21 Rash and other nonspecific skin eruption: Secondary | ICD-10-CM | POA: Diagnosis not present

## 2022-03-13 MED ORDER — HYDROCORTISONE 0.5 % EX CREA: 1.0000 | TOPICAL_CREAM | Freq: Two times a day (BID) | CUTANEOUS | 0 refills | Status: DC

## 2022-03-13 NOTE — Progress Notes (Signed)
Date:  03/13/2022   Name:  Madeline Moon   DOB:  [birth year]   MRN:  [id number]  This encounter was conducted via video encounter due to the need for social distancing in light of the Covid-19 pandemic.  The patient was correctly identified.  I advised that I am conducting the visit from a secure room in my office at [facility] clinic.  The patient is located at home. The limitations of this form of encounter were discussed with the patient and he/she agreed to proceed.  Some vital signs will be absent.  Chief Complaint: Rash  Rash This is a new problem. Episode onset: X 2days. The problem has been gradually worsening since onset. The affected locations include the face. The rash is characterized by itchiness, redness and pain. Pertinent negatives include no fatigue, fever or shortness of breath. Past treatments include anti-itch cream. The treatment provided no relief.    Lab Results  Component Value Date   NA 138 09/19/2021   K 4.1 09/19/2021   CO2 26 09/19/2021   GLUCOSE 103 (H) 09/19/2021   BUN 26 (H) 09/19/2021   CREATININE 0.88 09/19/2021   CALCIUM 8.7 (L) 09/19/2021   EGFR 82 10/09/2020   GFRNONAA >60 09/19/2021   Lab Results  Component Value Date   CHOL 338 (H) 09/19/2021   HDL 81 09/19/2021   LDLCALC 248 (H) 09/19/2021   TRIG 44 09/19/2021   CHOLHDL 4.2 09/19/2021   Lab Results  Component Value Date   TSH 2.014 09/19/2021   Lab Results  Component Value Date   HGBA1C 5.5 09/19/2021   Lab Results  Component Value Date   WBC 3.6 (L) 09/19/2021   HGB 13.5 09/19/2021   HCT 42.0 09/19/2021   MCV 86.8 09/19/2021   PLT 226 09/19/2021   Lab Results  Component Value Date   ALT 26 09/19/2021   AST 24 09/19/2021   ALKPHOS 91 09/19/2021   BILITOT 0.4 09/19/2021   No results found for: "25OHVITD2", "25OHVITD3", "VD25OH"   Review of Systems  Constitutional:  Negative for chills, fatigue and fever.  Respiratory:  Negative for chest tightness and  shortness of breath.   Cardiovascular:  Negative for chest pain.  Skin:  Positive for rash.  Psychiatric/Behavioral:  Negative for dysphoric mood and sleep disturbance. The patient is not nervous/anxious.     Patient Active Problem List   Diagnosis Date Noted   Encounter for screening colonoscopy    Prediabetes 10/08/2020   Chronic right shoulder pain 02/28/2020   Elevated blood pressure reading 10/07/2019   Gastroesophageal reflux disease without esophagitis 10/06/2018   Palmar fascial fibromatosis 10/06/2018   Hyperlipidemia, mild 08/12/2016   Back muscle spasm 12/16/2014   Mixed urge and stress incontinence 11/09/2014   Osteoarthritis 11/09/2014    No Known Allergies  Past Surgical History:  Procedure Laterality Date   COLONOSCOPY WITH PROPOFOL N/A 10/08/2021   Procedure: COLONOSCOPY WITH PROPOFOL;  Surgeon: Lucilla Lame, MD;  Location: [facility];  Service: Endoscopy;  Laterality: N/A;   RIGHT OOPHORECTOMY     ? patient does not recall this surgery   TUBAL LIGATION      Social History   Tobacco Use   Smoking status: Never   Smokeless tobacco: Never  Vaping Use   Vaping Use: Never used  Substance Use Topics   Alcohol use: Yes    Alcohol/week: 2.0 standard drinks of alcohol    Types: 2 Standard drinks or equivalent per week  Comment: occasional   Drug use: No     Medication list has been reviewed and updated.  Current Meds  Medication Sig   hydrocortisone cream 0.5 % Apply 1 Application topically 2 (two) times daily. To rash on face.  Limit use to 7 consecutive days.   NON FORMULARY Sodium, potassium and magnesium solution.  15 drops in AM coffee   Pseudoephedrine HCl (SUDAFED PO) Take by mouth as needed.   VITAMIN D PO Take by mouth.       03/13/2022   11:18 AM 09/19/2021   10:04 AM 08/06/2021    3:38 PM 05/15/2021    3:58 PM  GAD 7 : Generalized Anxiety Score  Nervous, Anxious, on Edge 0 0 0 0  Control/stop worrying 0 0 0 0  Worry too much  - different things 0 0 0 0  Trouble relaxing 0 0 0 0  Restless 0 0 0 0  Easily annoyed or irritable 0 0 0 0  Afraid - awful might happen 0 0 0 0  Total GAD 7 Score 0 0 0 0  Anxiety Difficulty  Not difficult at all  Not difficult at all       03/13/2022   11:18 AM 09/19/2021   10:04 AM 08/06/2021    3:38 PM  Depression screen PHQ 2/9  Decreased Interest 0 0 0  Down, Depressed, Hopeless 0 0 0  PHQ - 2 Score 0 0 0  Altered sleeping 0 0 0  Tired, decreased energy 0 0 0  Change in appetite 0 0 0  Feeling bad or failure about yourself  0 0 0  Trouble concentrating 0 0 0  Moving slowly or fidgety/restless 0 0 0  Suicidal thoughts 0 0 0  PHQ-9 Score 0 0 0  Difficult doing work/chores Not difficult at all Not difficult at all Not difficult at all    BP Readings from Last 3 Encounters:  02/27/22 102/69  02/13/22 124/79  10/08/21 (!) 88/61    Physical Exam Vitals and nursing note reviewed.  Constitutional:      General: She is not in acute distress.    Appearance: She is well-developed.  HENT:     Head: Normocephalic and atraumatic.  Pulmonary:     Effort: Pulmonary effort is normal. No respiratory distress.  Skin:    Findings: Erythema present. No rash.     Comments: At site of ant bite on right cheek - no vesicles or drainage noted  Neurological:     Mental Status: She is alert and oriented to person, place, and time.  Psychiatric:        Mood and Affect: Mood normal.        Behavior: Behavior normal.     Wt Readings from Last 3 Encounters:  02/27/22 151 lb (68.5 kg)  02/13/22 151 lb (68.5 kg)  10/08/21 137 lb (62.1 kg)    Ht _0  (1.6 m)   BMI 26.75 kg/m   Assessment and Plan: 1. Rash Recommend OTC antihistamine as needed for itching Use topical cortisone for several days to reduce redness and itching Follow up if no improvement. - hydrocortisone cream 0.5 %; Apply 1 Application topically 2 (two) times daily. To rash on face.  Limit use to 7 consecutive  days.  Dispense: 30 g; Refill: 0  I spent 10 minutes on this encounter, 100% via video with telephone audio. Partially dictated using Editor, commissioning. Any errors are unintentional.  Halina Maidens, MD [facility]  03/13/2022

## 2022-03-15 ENCOUNTER — Encounter: Payer: Self-pay | Admitting: Internal Medicine

## 2022-03-22 ENCOUNTER — Encounter: Payer: Self-pay | Admitting: Obstetrics and Gynecology

## 2022-03-22 ENCOUNTER — Ambulatory Visit (INDEPENDENT_AMBULATORY_CARE_PROVIDER_SITE_OTHER): Payer: 59 | Admitting: Obstetrics and Gynecology

## 2022-03-22 VITALS — BP 118/78 | HR 75

## 2022-03-22 DIAGNOSIS — N812 Incomplete uterovaginal prolapse: Secondary | ICD-10-CM

## 2022-03-22 DIAGNOSIS — N393 Stress incontinence (female) (male): Secondary | ICD-10-CM

## 2022-03-22 DIAGNOSIS — N816 Rectocele: Secondary | ICD-10-CM

## 2022-03-22 DIAGNOSIS — N8110 Cystocele, unspecified: Secondary | ICD-10-CM | POA: Diagnosis not present

## 2022-03-22 NOTE — Progress Notes (Signed)
[facility] Return Visit  SUBJECTIVE  History of Present Illness: Madeline Moon is a 61 y.o. female seen in follow-up after urodynamic testing.   Urodynamic Impression:  1. Sensation was normal; capacity was normal 2. Stress Incontinence was demonstrated at normal pressures; 3. Detrusor Overactivity was demonstrated without leakage. 4. Emptying was normal with a normal PVR, a sustained detrusor contraction present,  abdominal straining present, normal urethral sphincter activity on EMG.  Past Medical History: Patient  has a past medical history of Depression, major, single episode, complete remission ([place]) (12/16/2014).   Past Surgical History: She  has a past surgical history that includes Right oophorectomy; Tubal ligation; and Colonoscopy with propofol (N/A, 10/08/2021).   Medications: She has a current medication list which includes the following prescription(s): hydrocortisone cream, NON FORMULARY, pseudoephedrine hcl, and vitamin d.   Allergies: Patient has No Known Allergies.   Social History: Patient  reports that she has never smoked. She has never used smokeless tobacco. She reports current alcohol use of about 2.0 standard drinks of alcohol per week. She reports that she does not use drugs.      OBJECTIVE     Physical Exam: Vitals:   03/22/22 1513  BP: 118/78  Pulse: 75   Gen: No apparent distress, A&O x 3.  Detailed Urogynecologic Evaluation:  Deferred. Prior exam showed:  POP-Q:    POP-Q   0                                            Aa   0                                           Ba   -4                                              C    5                                            Gh   4                                            Pb   7                                            tvl    0                                            Ap   0  Bp   -6.5                                               D        ASSESSMENT AND PLAN    Madeline Moon is a 61 y.o. with:  1. Prolapse of anterior vaginal wall   2. Prolapse of posterior vaginal wall   3. Uterovaginal prolapse, incomplete   4. SUI (stress urinary incontinence, female)     Plan for surgery: Exam under anesthesia, anterior and posterior repair, sacrospinous hysteropexy, urethral bulking, cystoscopy  - We reviewed the patient's specific anatomic and functional findings, with the assistance of diagrams, and together finalized the above procedure. The planned surgical procedures were discussed along with the surgical risks outlined below, which were also provided on a detailed handout. Additional treatment options including expectant management, conservative management, medical management were discussed where appropriate.  We reviewed the benefits and risks of each treatment option.  - She prefers to keep her uterus in place and we decided on sacrospinous hysteropexy. We discussed that studies show that hysteropexy has equivalent short term outcomes to hysterectomy with prolapse repair, but unknown long term outcomes. 0  - Medical clearance: not required  - Anticoagulant use: No - Medicaid Hysterectomy form: No - Accepts blood transfusion: will confirm at pre op - Expected length of stay: outpatient  Request sent for surgery scheduling.   Marguerita Beards, MD

## 2022-04-15 ENCOUNTER — Encounter: Payer: Self-pay | Admitting: Obstetrics and Gynecology

## 2022-04-15 ENCOUNTER — Ambulatory Visit: Payer: 59 | Admitting: Obstetrics and Gynecology

## 2022-04-15 VITALS — BP 111/73 | HR 65

## 2022-04-15 DIAGNOSIS — N812 Incomplete uterovaginal prolapse: Secondary | ICD-10-CM

## 2022-04-15 DIAGNOSIS — N816 Rectocele: Secondary | ICD-10-CM

## 2022-04-15 DIAGNOSIS — N8110 Cystocele, unspecified: Secondary | ICD-10-CM

## 2022-04-15 NOTE — Progress Notes (Addendum)
 Urogynecology   Subjective:     Chief Complaint: Vaginal Prolapse  History of Present Illness: Madeline Moon is a 61 y.o. female with stage II pelvic organ prolapse who presents today for a pessary fitting.    Past Medical History: Patient  has a past medical history of Depression, major, single episode, complete remission (HCC) (12/16/2014).   Past Surgical History: She  has a past surgical history that includes Right oophorectomy; Tubal ligation; and Colonoscopy with propofol (N/A, 10/08/2021).   Medications: She has a current medication list which includes the following prescription(s): NON FORMULARY, pseudoephedrine hcl, and vitamin d.   Allergies: Patient has No Known Allergies.   Social History: Patient  reports that she has never smoked. She has never used smokeless tobacco. She reports current alcohol use of about 2.0 standard drinks of alcohol per week. She reports that she does not use drugs.      Objective:    BP 111/73 (BP Location: Left Arm, Patient Position: Sitting, Cuff Size: Normal)   Pulse 65   SpO2 99%  Gen: No apparent distress, A&O x 3. Pelvic Exam: Normal external female genitalia; Bartholin's and Skene's glands normal in appearance; urethral meatus normal in appearance, no urethral masses or discharge.   Attempted a #2 1/2 Gelhorn but patient was unable to tolerate insertion.  Attempted a #4 incontinence dish folding pessary which fell out during urination   A size #5  incontinence ring pessary (Lot  [id number]) was fitted. It was comfortable, stayed in place with valsalva and was an appropriate size on examination, with one finger fitting between the pessary and the vaginal walls. She was able to urinate and simulate a bowel movement without pessary expulsion.    Assessment/Plan:    Assessment: Madeline Moon is a 61 y.o. with stage II pelvic organ prolapse who presents for a pessary fitting. Plan: She was fitted with a #5  incontinence dish with knob pessary. She will keep the pessary in place until next visit. She will use coconut oil or other lubricant to help with vaginal moisture.   Follow-up in 3 months for a pessary check or surgery will take place first and will not need a follow up.     Selmer Dominion, NP

## 2022-04-15 NOTE — Patient Instructions (Signed)
Use coconut oil or vitamin e cream for vaginal moisture.

## 2022-05-23 ENCOUNTER — Ambulatory Visit: Payer: Self-pay | Admitting: *Deleted

## 2022-05-23 ENCOUNTER — Encounter: Payer: Self-pay | Admitting: Internal Medicine

## 2022-05-23 ENCOUNTER — Ambulatory Visit (INDEPENDENT_AMBULATORY_CARE_PROVIDER_SITE_OTHER): Payer: 59 | Admitting: Internal Medicine

## 2022-05-23 VITALS — BP 118/76 | HR 76 | Temp 98.1°F | Ht 63.0 in | Wt 159.0 lb

## 2022-05-23 DIAGNOSIS — J069 Acute upper respiratory infection, unspecified: Secondary | ICD-10-CM

## 2022-05-23 NOTE — Telephone Encounter (Signed)
  Chief Complaint: chest tightness with coughing  Symptoms: coughing productive at times, unknown color of sputum. Sore throat, voice hoarse, glands in neck tender to touch since last Saturday. Hx pneumonia x 2  Frequency: Tuesday / Wednesday 12/19-12/20/23 Pertinent Negatives: Patient denies chest pain no difficulty breathing reported. No fever reported. No dizziness no sweating no radiating pain to neck jaw or arm Disposition: [] ED /[] Urgent Care (no appt availability in office) / [x] Appointment(In office/virtual)/ []  [facility]/ [] Home Care/ [] Refused Recommended Disposition /[] [facility]/ []  Follow-up with PCP Additional Notes:   Appt scheduled for today .    Reason for Disposition  [1] Known COPD or other severe lung disease (i.e., bronchiectasis, cystic fibrosis, lung surgery) AND [2] worsening symptoms (i.e., increased sputum purulence or amount, increased breathing difficulty    Hx pneumonia x 2  Answer Assessment - Initial Assessment Questions 1. ONSET: "When did the cough begin?"      12/19-20-23 Tuesday or Wednesday  2. SEVERITY: "How bad is the cough today?"      Coughing and causes chest tightness 3. SPUTUM: "Describe the color of your sputum" (none, dry cough; clear, white, yellow, green)     Not sure  4. HEMOPTYSIS: "Are you coughing up any blood?" If so ask: "How much?" (flecks, streaks, tablespoons, etc.)     na 5. DIFFICULTY BREATHING: "Are you having difficulty breathing?" If Yes, ask: "How bad is it?" (e.g., mild, moderate, severe)    - MILD: No SOB at rest, mild SOB with walking, speaks normally in sentences, can lie down, no retractions, pulse < 100.    - MODERATE: SOB at rest, SOB with minimal exertion and prefers to sit, cannot lie down flat, speaks in phrases, mild retractions, audible wheezing, pulse 100-120.    - SEVERE: Very SOB at rest, speaks in single words, struggling to breathe, sitting hunched forward, retractions, pulse > 120       na 6. FEVER: "Do you have a fever?" If Yes, ask: "What is your temperature, how was it measured, and when did it start?"     na 7. CARDIAC HISTORY: "Do you have any history of heart disease?" (e.g., heart attack, congestive heart failure)      See hx  8. LUNG HISTORY: "Do you have any history of lung disease?"  (e.g., pulmonary embolus, asthma, emphysema)     Hx pneumonia  9. PE RISK FACTORS: "Do you have a history of blood clots?" (or: recent major surgery, recent prolonged travel, bedridden)     na 10. OTHER SYMPTOMS: "Do you have any other symptoms?" (e.g., runny nose, wheezing, chest pain)       Sore throat voice hoarse, cough, chest tightness with coughing glands tender to touch since Saturday  11. PREGNANCY: "Is there any chance you are pregnant?" "When was your last menstrual period?"       na 12. TRAVEL: "Have you traveled out of the country in the last month?" (e.g., travel history, exposures)       na  Protocols used: Cough - Acute Productive-A-AH

## 2022-05-23 NOTE — Progress Notes (Signed)
Date:  05/23/2022   Name:  Madeline Moon   DOB:  April 03, 1961   MRN:  [id number]   Chief Complaint: Sore Throat  Sore Throat  This is a new problem. Episode onset: X5-6 days. The problem has been gradually worsening. There has been no fever. The pain is at a severity of 3/10. The pain is mild. Associated symptoms include congestion, coughing, ear pain, headaches and swollen glands. Pertinent negatives include no ear discharge, hoarse voice, shortness of breath or trouble swallowing. She has had no exposure to strep or mono. She has tried gargles and acetaminophen for the symptoms. The treatment provided mild relief.    Lab Results  Component Value Date   NA 138 09/19/2021   K 4.1 09/19/2021   CO2 26 09/19/2021   GLUCOSE 103 (H) 09/19/2021   BUN 26 (H) 09/19/2021   CREATININE 0.88 09/19/2021   CALCIUM 8.7 (L) 09/19/2021   EGFR 82 10/09/2020   GFRNONAA >60 09/19/2021   Lab Results  Component Value Date   CHOL 338 (H) 09/19/2021   HDL 81 09/19/2021   LDLCALC 248 (H) 09/19/2021   TRIG 44 09/19/2021   CHOLHDL 4.2 09/19/2021   Lab Results  Component Value Date   TSH 2.014 09/19/2021   Lab Results  Component Value Date   HGBA1C 5.5 09/19/2021   Lab Results  Component Value Date   WBC 3.6 (L) 09/19/2021   HGB 13.5 09/19/2021   HCT 42.0 09/19/2021   MCV 86.8 09/19/2021   PLT 226 09/19/2021   Lab Results  Component Value Date   ALT 26 09/19/2021   AST 24 09/19/2021   ALKPHOS 91 09/19/2021   BILITOT 0.4 09/19/2021   No results found for: "25OHVITD2", "25OHVITD3", "VD25OH"   Review of Systems  Constitutional:  Negative for chills, fatigue and fever.  HENT:  Positive for congestion and ear pain. Negative for ear discharge, hoarse voice and trouble swallowing.   Respiratory:  Positive for cough. Negative for shortness of breath and wheezing.   Neurological:  Positive for headaches. Negative for dizziness.    Patient Active Problem List   Diagnosis Date Noted    Encounter for screening colonoscopy    Prediabetes 10/08/2020   Chronic right shoulder pain 02/28/2020   Elevated blood pressure reading 10/07/2019   Gastroesophageal reflux disease without esophagitis 10/06/2018   Palmar fascial fibromatosis 10/06/2018   Hyperlipidemia, mild 08/12/2016   Back muscle spasm 12/16/2014   Mixed urge and stress incontinence 11/09/2014   Osteoarthritis 11/09/2014    No Known Allergies  Past Surgical History:  Procedure Laterality Date   COLONOSCOPY WITH PROPOFOL N/A 10/08/2021   Procedure: COLONOSCOPY WITH PROPOFOL;  Surgeon: Lucilla Lame, MD;  Location: [facility];  Service: Endoscopy;  Laterality: N/A;   RIGHT OOPHORECTOMY     ? patient does not recall this surgery   TUBAL LIGATION      Social History   Tobacco Use   Smoking status: Never   Smokeless tobacco: Never  Vaping Use   Vaping Use: Never used  Substance Use Topics   Alcohol use: Yes    Alcohol/week: 2.0 standard drinks of alcohol    Types: 2 Standard drinks or equivalent per week    Comment: occasional   Drug use: No     Medication list has been reviewed and updated.  Current Meds  Medication Sig   NON FORMULARY Sodium, potassium and magnesium solution.  15 drops in AM coffee   Phenyleph-Doxylamine-DM-APAP (ALKA SELTZER  PLUS PO) Take by mouth.   Pseudoephedrine HCl (SUDAFED PO) Take by mouth as needed.   VITAMIN D PO Take by mouth as needed.       05/23/2022   11:10 AM 03/13/2022   11:18 AM 09/19/2021   10:04 AM 08/06/2021    3:38 PM  GAD 7 : Generalized Anxiety Score  Nervous, Anxious, on Edge 0 0 0 0  Control/stop worrying 0 0 0 0  Worry too much - different things 0 0 0 0  Trouble relaxing 0 0 0 0  Restless 0 0 0 0  Easily annoyed or irritable 0 0 0 0  Afraid - awful might happen 0 0 0 0  Total GAD 7 Score 0 0 0 0  Anxiety Difficulty Not difficult at all  Not difficult at all        05/23/2022   11:10 AM 03/13/2022   11:18 AM 09/19/2021   10:04  AM  Depression screen PHQ 2/9  Decreased Interest 0 0 0  Down, Depressed, Hopeless 0 0 0  PHQ - 2 Score 0 0 0  Altered sleeping 0 0 0  Tired, decreased energy 0 0 0  Change in appetite 0 0 0  Feeling bad or failure about yourself  0 0 0  Trouble concentrating 0 0 0  Moving slowly or fidgety/restless 0 0 0  Suicidal thoughts 0 0 0  PHQ-9 Score 0 0 0  Difficult doing work/chores Not difficult at all Not difficult at all Not difficult at all    BP Readings from Last 3 Encounters:  05/23/22 118/76  04/15/22 111/73  03/22/22 118/78    Physical Exam Constitutional:      Appearance: She is well-developed.  HENT:     Right Ear: Tympanic membrane normal.     Left Ear: Tympanic membrane normal.     Nose: No congestion.     Mouth/Throat:     Mouth: Mucous membranes are moist.     Pharynx: Posterior oropharyngeal erythema present. No pharyngeal swelling.     Tonsils: No tonsillar exudate or tonsillar abscesses.  Eyes:     Conjunctiva/sclera: Conjunctivae normal.  Cardiovascular:     Rate and Rhythm: Normal rate and regular rhythm.  Pulmonary:     Effort: Pulmonary effort is normal.     Breath sounds: No wheezing or rhonchi.  Musculoskeletal:     Cervical back: Normal range of motion.  Lymphadenopathy:     Cervical: No cervical adenopathy.  Neurological:     Mental Status: She is alert.     Wt Readings from Last 3 Encounters:  05/23/22 159 lb (72.1 kg)  02/27/22 151 lb (68.5 kg)  02/13/22 151 lb (68.5 kg)    BP 118/76   Pulse 76   Temp 98.1 F (36.7 C) (Oral)   Ht _0  (1.6 m)   Wt 159 lb (72.1 kg)   SpO2 98%   BMI 28.17 kg/m   Assessment and Plan: Problem List Items Addressed This Visit   None Visit Diagnoses     Viral URI    -  Primary   Covid test negative recommend supportive care with warm liquids, tylenol, fluids and rest        Partially dictated using Editor, commissioning. Any errors are unintentional.  Halina Maidens, MD [facility]  05/23/2022

## 2022-06-05 ENCOUNTER — Encounter: Payer: Self-pay | Admitting: Obstetrics and Gynecology

## 2022-06-05 ENCOUNTER — Ambulatory Visit (INDEPENDENT_AMBULATORY_CARE_PROVIDER_SITE_OTHER): Payer: 59 | Admitting: Obstetrics and Gynecology

## 2022-06-05 VITALS — BP 118/74 | HR 73

## 2022-06-05 DIAGNOSIS — N8110 Cystocele, unspecified: Secondary | ICD-10-CM | POA: Diagnosis not present

## 2022-06-05 DIAGNOSIS — N816 Rectocele: Secondary | ICD-10-CM

## 2022-06-05 DIAGNOSIS — N393 Stress incontinence (female) (male): Secondary | ICD-10-CM

## 2022-06-05 DIAGNOSIS — Z01818 Encounter for other preprocedural examination: Secondary | ICD-10-CM

## 2022-06-05 MED ORDER — POLYETHYLENE GLYCOL 3350 17 GM/SCOOP PO POWD: 17.0000 g | Freq: Every day | ORAL | 0 refills | Status: DC

## 2022-06-05 MED ORDER — ACETAMINOPHEN 500 MG PO TABS: 500.0000 mg | ORAL_TABLET | Freq: Four times a day (QID) | ORAL | 0 refills | Status: DC | PRN

## 2022-06-05 MED ORDER — IBUPROFEN 600 MG PO TABS: 600.0000 mg | ORAL_TABLET | Freq: Four times a day (QID) | ORAL | 0 refills | Status: DC | PRN

## 2022-06-05 MED ORDER — OXYCODONE HCL 5 MG PO TABS: 5.0000 mg | ORAL_TABLET | ORAL | 0 refills | Status: DC | PRN

## 2022-06-05 NOTE — Progress Notes (Signed)
[facility] Urogynecology Pre-Operative visit  Subjective Chief Complaint: Madeline Moon presents for a preoperative encounter.   History of Present Illness: Madeline Moon is a 62 y.o. female who presents for preoperative visit.  She is scheduled to undergo Exam under anesthesia, Robotic assisted total laparoscopic hysterectomy, bilateral salpingectomy, sacrocolpopexy,  midurethral sling, cystoscopy, possible perineorrhaphy on 03/22/22.  Her symptoms include vaginal bulge, and she was was found to have Stage II anterior, Stage II posterior, Stage I apical prolapse.   Urodynamic Impression:  1. Sensation was normal; capacity was normal 2. Stress Incontinence was demonstrated at normal pressures; 3. Detrusor Overactivity was demonstrated without leakage. 4. Emptying was normal with a normal PVR, a sustained detrusor contraction present,  abdominal straining present, normal urethral sphincter activity on EMG.  Past Medical History:  Diagnosis Date   Depression, major, single episode, complete remission (HCC) 12/16/2014     Past Surgical History:  Procedure Laterality Date   COLONOSCOPY WITH PROPOFOL N/A 10/08/2021   Procedure: COLONOSCOPY WITH PROPOFOL;  Surgeon: Midge Minium, MD;  Location: [facility];  Service: Endoscopy;  Laterality: N/A;   RIGHT OOPHORECTOMY     ? patient does not recall this surgery   TUBAL LIGATION      has No Known Allergies.   Family History  Problem Relation Age of Onset   Dementia Mother    CAD Father    AAA (abdominal aortic aneurysm) Father    Thyroid nodules Father    AAA (abdominal aortic aneurysm) Brother    Thyroid nodules Paternal Grandmother    Colon cancer Paternal Grandfather 77   Breast cancer Neg Hx     Social History   Tobacco Use   Smoking status: Never   Smokeless tobacco: Never  Vaping Use   Vaping Use: Never used  Substance Use Topics   Alcohol use: Yes    Alcohol/week: 2.0 standard drinks of alcohol     Types: 2 Standard drinks or equivalent per week    Comment: occasional   Drug use: No     Review of Systems was negative for a full 10 system review except as noted in the History of Present Illness.   Current Outpatient Medications:    NON FORMULARY, Sodium, potassium and magnesium solution.  15 drops in AM coffee, Disp: , Rfl:    Phenyleph-Doxylamine-DM-APAP (ALKA SELTZER PLUS PO), Take by mouth., Disp: , Rfl:    Pseudoephedrine HCl (SUDAFED PO), Take by mouth as needed., Disp: , Rfl:    VITAMIN D PO, Take by mouth as needed., Disp: , Rfl:    Objective Vitals:   06/05/22 1150  BP: 118/74  Pulse: 73    Gen: NAD CV: S1 S2 RRR Lungs: Clear to auscultation bilaterally Abd: soft, nontender   Previous Pelvic Exam showed: POP-Q:    POP-Q   0                                            Aa   0                                           Ba   -4  C    5                                            Gh   4                                            Pb   7                                            tvl    0                                            Ap   0                                            Bp   -6.5                                              D       Assessment/ Plan  Assessment: The patient is a 62 y.o. year old scheduled to undergo Exam under anesthesia, Robotic assisted total laparoscopic hysterectomy, bilateral salpingectomy, sacrocolpopexy,  midurethral sling, cystoscopy, possible perineorrhaphy. Verbal consent was obtained for these procedures.  Plan: General Surgical Consent: The patient has previously been counseled on alternative treatments, and the decision by the patient and provider was to proceed with the procedure listed above.  For all procedures, there are risks of bleeding, infection, damage to surrounding organs including but not limited to bowel, bladder, blood vessels, ureters and nerves,  and need for further surgery if an injury were to occur. These risks are all low with minimally invasive surgery.   There are risks of numbness and weakness at any body site or buttock/rectal pain.  It is possible that baseline pain can be worsened by surgery, either with or without mesh. If surgery is vaginal, there is also a low risk of possible conversion to laparoscopy or open abdominal incision where indicated. Very rare risks include blood transfusion, blood clot, heart attack, pneumonia, or death.   There is also a risk of short-term postoperative urinary retention with need to use a catheter. About half of patients need to go home from surgery with a catheter, which is then later removed in the office. The risk of long-term need for a catheter is very low. There is also a risk of worsening of overactive bladder.   Sling: The effectiveness of a midurethral vaginal mesh sling is approximately 85%, and thus, there will be times when you may leak urine after surgery, especially if your bladder is full or if you have a strong cough. There is a balance between making the sling tight enough to treat your leakage but not too tight so that you have long-term difficulty  emptying your bladder. A mesh sling will not directly treat overactive bladder/urge incontinence and may worsen it.  There is an FDA safety notification on vaginal mesh procedures for prolapse but NOT mesh slings. We have extensive experience and training with mesh placement and we have close postoperative follow up to identify any potential complications from mesh. It is important to realize that this mesh is a permanent implant that cannot be easily removed. There are rare risks of mesh exposure (2-4%), pain with intercourse (0-7%), and infection (<1%). The risk of mesh exposure if more likely in a woman with risks for poor healing (prior radiation, poorly controlled diabetes, or immunocompromised). The risk of new or worsened chronic pain  after mesh implant is more common in women with baseline chronic pain and/or poorly controlled anxiety or depression. Approximately 2-4% of patients will experience longer-term post-operative voiding dysfunction that may require surgical revision of the sling. We also reviewed that postoperatively, her stream may not be as strong as before surgery.    Prolapse (with or without mesh): Risk factors for surgical failure  include things that put pressure on your pelvis and the surgical repair, including obesity, chronic cough, and heavy lifting or straining (including lifting children or adults, straining on the toilet, or lifting heavy objects such as furniture or anything weighing >25 lbs. Risks of recurrence is 20-30% with vaginal native tissue repair and a less than 10% with sacrocolpopexy with mesh.    Sacrocolpopexy: Mesh implants may provide more prolapse support, but do have some unique risks to consider. It is important to understand that mesh is permanent and cannot be easily removed. Risks of abdominal sacrocolpopexy mesh include mesh exposure (~3-6%), painful intercourse (recent studies show lower rates after surgery compared to before, with ~5-8% risk of new onset), and very rare risks of bowel or bladder injury or infection (<1%). The risk of mesh exposure is more likely in a woman with risks for poor healing (prior radiation, poorly controlled diabetes, or immunocompromised). The risk of new or worsened chronic pain after mesh implant is more common in women with baseline chronic pain and/or poorly controlled anxiety or depression. There is an FDA safety notification on vaginal mesh procedures for prolapse but NOT abdominal mesh procedures and therefore does not apply to your surgery. We have extensive experience and training with mesh placement and we have close postoperative follow up to identify any potential complications from mesh.    We discussed consent for blood products. Risks for blood  transfusion include allergic reactions, other reactions that can affect different body organs and managed accordingly, transmission of infectious diseases such as HIV or Hepatitis. However, the blood is screened. Patient consents for blood products.  Pre-operative instructions:  She was instructed to not take Aspirin/NSAIDs x 7days prior to surgery.  Antibiotic prophylaxis was ordered as indicated.  Catheter use: Patient will go home with foley if needed after post-operative voiding trial.  Post-operative instructions:  She was provided with specific post-operative instructions, including precautions and signs/symptoms for which we would recommend contacting us, in addition to daytime and after-hours contact phone numbers. This was provided on a handout.   Post-operative medications: Prescriptions for motrin, tylenol, miralax, and oxycodone were sent to her pharmacy. Discussed using ibuprofen and tylenol on a schedule to limit use of narcotics.   Laboratory testing:  CBC, Type and screen  Preoperative clearance:  She does not require surgical clearance.    Post-operative follow-up:  A post-operative appointment will be made for 6  weeks from the date of surgery. If she needs a post-operative nurse visit for a voiding trial, that will be set up after she leaves the hospital.    Patient will call the clinic or use MyChart should anything change or any new issues arise.   Jaquita Folds, MD  Time spent: I spent 25 minutes dedicated to the care of this patient on the date of this encounter to include pre-visit review of records, face-to-face time with the patient  and post visit documentation and ordering medication/ testing.

## 2022-06-05 NOTE — H&P (Signed)
[facility] Pre-Operative H&P  Subjective Chief Complaint: Madeline Moon presents for a preoperative encounter.   History of Present Illness: Madeline Moon is a 62 y.o. female who presents for preoperative visit.  She is scheduled to undergo Exam under anesthesia, Robotic assisted total laparoscopic hysterectomy, bilateral salpingectomy, sacrocolpopexy,  midurethral sling, cystoscopy, possible perineorrhaphy on 03/22/22.  Her symptoms include vaginal bulge, and she was was found to have Stage II anterior, Stage II posterior, Stage I apical prolapse.   Urodynamic Impression:  1. Sensation was normal; capacity was normal 2. Stress Incontinence was demonstrated at normal pressures; 3. Detrusor Overactivity was demonstrated without leakage. 4. Emptying was normal with a normal PVR, a sustained detrusor contraction present,  abdominal straining present, normal urethral sphincter activity on EMG.  Past Medical History:  Diagnosis Date   Depression, major, single episode, complete remission ([place]) 12/16/2014     Past Surgical History:  Procedure Laterality Date   COLONOSCOPY WITH PROPOFOL N/A 10/08/2021   Procedure: COLONOSCOPY WITH PROPOFOL;  Surgeon: Lucilla Lame, MD;  Location: [facility];  Service: Endoscopy;  Laterality: N/A;   RIGHT OOPHORECTOMY     ? patient does not recall this surgery   TUBAL LIGATION      has No Known Allergies.   Family History  Problem Relation Age of Onset   Dementia Mother    CAD Father    AAA (abdominal aortic aneurysm) Father    Thyroid nodules Father    AAA (abdominal aortic aneurysm) Brother    Thyroid nodules Paternal Grandmother    Colon cancer Paternal Grandfather 67   Breast cancer Neg Hx     Social History   Tobacco Use   Smoking status: Never   Smokeless tobacco: Never  Vaping Use   Vaping Use: Never used  Substance Use Topics   Alcohol use: Yes    Alcohol/week: 2.0 standard drinks of alcohol     Types: 2 Standard drinks or equivalent per week    Comment: occasional   Drug use: No     Review of Systems was negative for a full 10 system review except as noted in the History of Present Illness.  No current facility-administered medications for this encounter.  Current Outpatient Medications:    acetaminophen (TYLENOL) 500 MG tablet, Take 1 tablet (500 mg total) by mouth every 6 (six) hours as needed (pain)., Disp: 30 tablet, Rfl: 0   ibuprofen (ADVIL) 600 MG tablet, Take 1 tablet (600 mg total) by mouth every 6 (six) hours as needed., Disp: 30 tablet, Rfl: 0   NON FORMULARY, Sodium, potassium and magnesium solution.  15 drops in AM coffee, Disp: , Rfl:    oxyCODONE (OXY IR/ROXICODONE) 5 MG immediate release tablet, Take 1 tablet (5 mg total) by mouth every 4 (four) hours as needed for severe pain., Disp: 15 tablet, Rfl: 0   Phenyleph-Doxylamine-DM-APAP (ALKA SELTZER PLUS PO), Take by mouth., Disp: , Rfl:    polyethylene glycol powder (GLYCOLAX/MIRALAX) 17 GM/SCOOP powder, Take 17 g by mouth daily. Drink 17g (1 scoop) dissolved in water per day., Disp: 255 g, Rfl: 0   Pseudoephedrine HCl (SUDAFED PO), Take by mouth as needed., Disp: , Rfl:    VITAMIN D PO, Take by mouth as needed., Disp: , Rfl:    Objective There were no vitals filed for this visit.   Gen: NAD CV: S1 S2 RRR Lungs: Clear to auscultation bilaterally Abd: soft, nontender   Previous Pelvic Exam showed: POP-Q:  POP-Q   0                                            Aa   0                                           Ba   -4                                              C    5                                            Gh   4                                            Pb   7                                            tvl    0                                            Ap   0                                            Bp   -6.5                                              D       Assessment/  Plan  The patient is a 62 y.o. year old scheduled to undergo Exam under anesthesia, Robotic assisted total laparoscopic hysterectomy, bilateral salpingectomy, sacrocolpopexy,  midurethral sling, cystoscopy, possible perineorrhaphy.    Jaquita Folds, MD

## 2022-06-11 ENCOUNTER — Encounter: Payer: Self-pay | Admitting: Obstetrics and Gynecology

## 2022-06-19 ENCOUNTER — Encounter (HOSPITAL_BASED_OUTPATIENT_CLINIC_OR_DEPARTMENT_OTHER): Payer: Self-pay | Admitting: Obstetrics and Gynecology

## 2022-06-21 ENCOUNTER — Other Ambulatory Visit: Payer: Self-pay

## 2022-06-21 ENCOUNTER — Encounter (HOSPITAL_BASED_OUTPATIENT_CLINIC_OR_DEPARTMENT_OTHER): Payer: Self-pay | Admitting: Obstetrics and Gynecology

## 2022-06-21 DIAGNOSIS — Z01812 Encounter for preprocedural laboratory examination: Secondary | ICD-10-CM | POA: Diagnosis not present

## 2022-06-21 DIAGNOSIS — N3946 Mixed incontinence: Secondary | ICD-10-CM | POA: Diagnosis not present

## 2022-06-21 NOTE — Progress Notes (Signed)
Spoke w/ via phone for pre-op interview---[facility] needs dos----  none             Lab results------06/27/22 lab appt for cbc, type & screen COVID test -----patient states asymptomatic no test needed Arrive at -------0745 on Monday, 07/01/22 NPO after MN NO Solid Food.  Clear liquids from MN until---0645 Med rec completed Medications to take morning of surgery -----none Diabetic medication -----n/a Patient instructed no nail polish to be worn day of surgery Patient instructed to bring photo id and insurance card day of surgery Patient aware to have Driver (ride ) / caregiver    for 24 hours after surgery  Patient Special Instructions -----Extended / overnight stay instructions given. Pre-Op special Istructions -----none Patient verbalized understanding of instructions that were given at this phone interview. Patient denies shortness of breath, chest pain, fever, cough at this phone interview.

## 2022-06-21 NOTE — Progress Notes (Signed)
Your procedure is scheduled on Monday, 07/01/22.  Report to [facility]   Call this number if you have problems the morning of surgery  :[phone].   OUR ADDRESS IS [address].  WE ARE LOCATED IN [facility].  PLEASE BRING YOUR INSURANCE CARD AND PHOTO ID DAY OF SURGERY.  ONLY 2 PEOPLE ARE ALLOWED IN  WAITING  ROOM.                                      REMEMBER:  DO NOT EAT FOOD, CANDY GUM OR MINTS  AFTER MIDNIGHT THE NIGHT BEFORE YOUR SURGERY . YOU MAY HAVE CLEAR LIQUIDS FROM MIDNIGHT THE NIGHT BEFORE YOUR SURGERY UNTIL  6:45 AM. NO CLEAR LIQUIDS AFTER   6:45 AM DAY OF SURGERY.  YOU MAY  BRUSH YOUR TEETH MORNING OF SURGERY AND RINSE YOUR MOUTH OUT, NO CHEWING GUM CANDY OR MINTS.     CLEAR LIQUID DIET   Foods Allowed                                                                     Foods Excluded  Coffee and tea, regular and decaf                             liquids that you cannot  Plain Jell-O                                                                   see through such as: Fruit ices (not with fruit pulp)                                     milk, soups, orange juice  Plain  Popsicles                                    All solid food Carbonated beverages, regular and diet                                    Cranberry, grape and apple juices Sports drinks like Gatorade _____________________________________________________________________     TAKE ONLY THESE MEDICATIONS MORNING OF SURGERY: NONE    UP TO 4 VISITORS  MAY VISIT IN THE EXTENDED RECOVERY ROOM UNTIL 800 PM ONLY.  ONE  VISITOR AGE 69 AND OVER MAY SPEND THE NIGHT AND MUST BE IN EXTENDED RECOVERY ROOM NO LATER THAN 800 PM . YOUR DISCHARGE TIME AFTER YOU SPEND THE NIGHT IS 900 AM THE MORNING AFTER YOUR SURGERY.  YOU MAY PACK A SMALL OVERNIGHT BAG WITH TOILETRIES FOR YOUR OVERNIGHT STAY  IF YOU WISH.  YOUR PRESCRIPTION MEDICATIONS WILL BE PROVIDED  DURING [place].                                      DO NOT WEAR JEWERLY, MAKE UP. DO NOT WEAR LOTIONS, POWDERS, PERFUMES OR NAIL POLISH ON YOUR FINGERNAILS. TOENAIL POLISH IS OK TO WEAR. DO NOT SHAVE FOR 48 HOURS PRIOR TO DAY OF SURGERY. MEN MAY SHAVE FACE AND NECK. CONTACTS, GLASSES, OR DENTURES MAY NOT BE WORN TO SURGERY.  REMEMBER: NO SMOKING, DRUGS OR ALCOHOL FOR 24 HOURS BEFORE YOUR SURGERY.                                     IS NOT RESPONSIBLE  FOR ANY BELONGINGS.                                                                    Marland Kitchen            - Preparing for Surgery Before surgery, you can play an important role.  Because skin is not sterile, your skin needs to be as free of germs as possible.  You can reduce the number of germs on your skin by washing with CHG (chlorahexidine gluconate) soap before surgery.  CHG is an antiseptic cleaner which kills germs and bonds with the skin to continue killing germs even after washing. Please DO NOT use if you have an allergy to CHG or antibacterial soaps.  If your skin becomes reddened/irritated stop using the CHG and inform your nurse when you arrive at Short Stay. Do not shave (including legs and underarms) for at least 48 hours prior to the first CHG shower.  You may shave your face/neck. Please follow these instructions carefully:  1.  Shower with CHG Soap the night before surgery and the  morning of Surgery.  2.  If you choose to wash your hair, wash your hair first as usual with your  normal  shampoo.  3.  After you shampoo, rinse your hair and body thoroughly to remove the  shampoo.                                        4.  Use CHG as you would any other liquid soap.  You can apply chg directly  to the skin and wash , chg soap provided, night before and morning of your surgery.  5.  Apply the CHG Soap to your body ONLY FROM THE NECK DOWN.   Do not use on face/ open                           Wound or open  sores. Avoid contact with eyes, ears mouth and genitals (private parts).                       Wash face,  Genitals (private parts) with your normal soap.  6.  Wash thoroughly, paying special attention to the area where your surgery  will be performed.  7.  Thoroughly rinse your body with warm water from the neck down.  8.  DO NOT shower/wash with your normal soap after using and rinsing off  the CHG Soap.             9.  Pat yourself dry with a clean towel.            10.  Wear clean pajamas.            11.  Place clean sheets on your bed the night of your first shower and do not  sleep with pets. Day of Surgery : Do not apply any lotions/deodorants the morning of surgery.  Please wear clean clothes to the hospital/surgery center.  IF YOU HAVE ANY SKIN IRRITATION OR PROBLEMS WITH THE SURGICAL SOAP, PLEASE GET A BAR OF GOLD DIAL SOAP AND SHOWER THE NIGHT BEFORE YOUR SURGERY AND THE MORNING OF YOUR SURGERY. PLEASE LET THE NURSE KNOW MORNING OF YOUR SURGERY IF YOU HAD ANY PROBLEMS WITH THE SURGICAL SOAP.   ________________________________________________________________________                                                        QUESTIONS [facility] PRE OP NURSE PHONE [phone].

## 2022-06-27 ENCOUNTER — Encounter (HOSPITAL_COMMUNITY)
Admission: RE | Admit: 2022-06-27 | Discharge: 2022-06-27 | Disposition: A | Discharge status: Home / Self Care | Payer: 59 | Source: Ambulatory Visit | Attending: Obstetrics and Gynecology | Admitting: Obstetrics and Gynecology

## 2022-06-27 DIAGNOSIS — Z01812 Encounter for preprocedural laboratory examination: Secondary | ICD-10-CM | POA: Insufficient documentation

## 2022-06-27 DIAGNOSIS — N3946 Mixed incontinence: Secondary | ICD-10-CM | POA: Diagnosis not present

## 2022-06-27 LAB — CBC
HCT: 42.3 % (ref 36.0–46.0)
Hemoglobin: 13.5 g/dL (ref 12.0–15.0)
MCH: 27.5 pg (ref 26.0–34.0)
MCHC: 31.9 g/dL (ref 30.0–36.0)
MCV: 86.2 fL (ref 80.0–100.0)
Platelets: 206 10*3/uL (ref 150–400)
RBC: 4.91 MIL/uL (ref 3.87–5.11)
RDW: 15.1 % (ref 11.5–15.5)
WBC: 4.8 10*3/uL (ref 4.0–10.5)
nRBC: 0 % (ref 0.0–0.2)

## 2022-06-28 NOTE — Progress Notes (Signed)
Patient called in with a few questions about her upcoming hysterectomy. She had a stuffy nose that she said was normal for her when the weather was humid. She asked is she should take a covid test. I advised her that this would be a good idea since her surgery is only a few days away. She stated that she doesn't really feel sick. I told her that if her symptoms got worse or her Covid test was positive to let Dr. Wannetta Sender know right away. She was agreeable.

## 2022-07-01 ENCOUNTER — Encounter (HOSPITAL_BASED_OUTPATIENT_CLINIC_OR_DEPARTMENT_OTHER): Payer: Self-pay | Admitting: Obstetrics and Gynecology

## 2022-07-01 ENCOUNTER — Other Ambulatory Visit: Payer: Self-pay

## 2022-07-01 ENCOUNTER — Ambulatory Visit (HOSPITAL_BASED_OUTPATIENT_CLINIC_OR_DEPARTMENT_OTHER): Payer: 59 | Admitting: Anesthesiology

## 2022-07-01 ENCOUNTER — Ambulatory Visit (HOSPITAL_BASED_OUTPATIENT_CLINIC_OR_DEPARTMENT_OTHER)
Admission: RE | Admit: 2022-07-01 | Discharge: 2022-07-01 | Disposition: A | Discharge status: Home / Self Care | Payer: 59 | Attending: Obstetrics and Gynecology | Admitting: Obstetrics and Gynecology

## 2022-07-01 ENCOUNTER — Encounter (HOSPITAL_BASED_OUTPATIENT_CLINIC_OR_DEPARTMENT_OTHER)
Admission: RE | Disposition: A | Discharge status: Home / Self Care | Payer: Self-pay | Source: Home / Self Care | Attending: Obstetrics and Gynecology

## 2022-07-01 DIAGNOSIS — N393 Stress incontinence (female) (male): Secondary | ICD-10-CM | POA: Insufficient documentation

## 2022-07-01 DIAGNOSIS — N812 Incomplete uterovaginal prolapse: Secondary | ICD-10-CM | POA: Diagnosis present

## 2022-07-01 DIAGNOSIS — Z01818 Encounter for other preprocedural examination: Secondary | ICD-10-CM

## 2022-07-01 DIAGNOSIS — N888 Other specified noninflammatory disorders of cervix uteri: Secondary | ICD-10-CM | POA: Diagnosis not present

## 2022-07-01 DIAGNOSIS — K219 Gastro-esophageal reflux disease without esophagitis: Secondary | ICD-10-CM | POA: Insufficient documentation

## 2022-07-01 DIAGNOSIS — N814 Uterovaginal prolapse, unspecified: Secondary | ICD-10-CM | POA: Diagnosis not present

## 2022-07-01 DIAGNOSIS — N858 Other specified noninflammatory disorders of uterus: Secondary | ICD-10-CM | POA: Diagnosis not present

## 2022-07-01 DIAGNOSIS — N3946 Mixed incontinence: Secondary | ICD-10-CM

## 2022-07-01 HISTORY — DX: Dizziness and giddiness: R42

## 2022-07-01 HISTORY — DX: Gastro-esophageal reflux disease without esophagitis: K21.9

## 2022-07-01 HISTORY — PX: CYSTOSCOPY: SHX5120

## 2022-07-01 HISTORY — DX: Presence of spectacles and contact lenses: Z97.3

## 2022-07-01 HISTORY — PX: XI ROBOTIC ASSISTED TOTAL HYSTERECTOMY WITH SACROCOLPOPEXY: SHX6825

## 2022-07-01 HISTORY — DX: Personal history of urinary calculi: Z87.442

## 2022-07-01 HISTORY — DX: Personal history of other specified conditions: Z87.898

## 2022-07-01 HISTORY — PX: BLADDER SUSPENSION: SHX72

## 2022-07-01 HISTORY — PX: PERINEOPLASTY: SHX2218

## 2022-07-01 LAB — ABO/RH: ABO/RH(D): A POS

## 2022-07-01 LAB — TYPE AND SCREEN
ABO/RH(D): A POS
Antibody Screen: NEGATIVE

## 2022-07-01 SURGERY — HYSTERECTOMY, TOTAL, ROBOT-ASSISTED, WITH SACROCOLPOPEXY
Anesthesia: General | Site: Vagina

## 2022-07-01 MED ORDER — OXYCODONE HCL 5 MG PO TABS
5.0000 mg | ORAL_TABLET | ORAL | Status: DC | PRN
  Administered 2022-07-01 (×2): 5 mg via ORAL

## 2022-07-01 MED ORDER — PHENYLEPHRINE 80 MCG/ML (10ML) SYRINGE FOR IV PUSH (FOR BLOOD PRESSURE SUPPORT)
PREFILLED_SYRINGE | INTRAVENOUS | Status: AC
  Filled 2022-07-01: qty 10

## 2022-07-01 MED ORDER — ACETAMINOPHEN 500 MG PO TABS
ORAL_TABLET | ORAL | Status: AC
  Filled 2022-07-01: qty 2

## 2022-07-01 MED ORDER — ONDANSETRON HCL 4 MG/2ML IJ SOLN
INTRAMUSCULAR | Status: AC
  Filled 2022-07-01: qty 2

## 2022-07-01 MED ORDER — PROPOFOL 10 MG/ML IV BOLUS
INTRAVENOUS | Status: AC
  Filled 2022-07-01: qty 20

## 2022-07-01 MED ORDER — MEPERIDINE HCL 25 MG/ML IJ SOLN: 6.2500 mg | INTRAMUSCULAR | Status: DC | PRN

## 2022-07-01 MED ORDER — ROCURONIUM BROMIDE 100 MG/10ML IV SOLN
INTRAVENOUS | Status: DC | PRN
  Administered 2022-07-01 (×4): 10 mg via INTRAVENOUS
  Administered 2022-07-01: 70 mg via INTRAVENOUS
  Administered 2022-07-01: 10 mg via INTRAVENOUS

## 2022-07-01 MED ORDER — OXYCODONE HCL 5 MG PO TABS
ORAL_TABLET | ORAL | Status: AC
  Filled 2022-07-01: qty 1

## 2022-07-01 MED ORDER — SUGAMMADEX SODIUM 200 MG/2ML IV SOLN
INTRAVENOUS | Status: DC | PRN
  Administered 2022-07-01: 200 mg via INTRAVENOUS

## 2022-07-01 MED ORDER — ONDANSETRON HCL 4 MG/2ML IJ SOLN
INTRAMUSCULAR | Status: DC | PRN
  Administered 2022-07-01: 4 mg via INTRAVENOUS

## 2022-07-01 MED ORDER — CEFAZOLIN SODIUM-DEXTROSE 2-4 GM/100ML-% IV SOLN
2.0000 g | INTRAVENOUS | Status: AC
  Administered 2022-07-01: 2 g via INTRAVENOUS

## 2022-07-01 MED ORDER — GABAPENTIN 300 MG PO CAPS
300.0000 mg | ORAL_CAPSULE | ORAL | Status: AC
  Administered 2022-07-01: 300 mg via ORAL

## 2022-07-01 MED ORDER — FENTANYL CITRATE (PF) 100 MCG/2ML IJ SOLN
INTRAMUSCULAR | Status: DC | PRN
  Administered 2022-07-01: 100 ug via INTRAVENOUS
  Administered 2022-07-01: 50 ug via INTRAVENOUS
  Administered 2022-07-01: 100 ug via INTRAVENOUS
  Administered 2022-07-01 (×2): 50 ug via INTRAVENOUS

## 2022-07-01 MED ORDER — DEXAMETHASONE SODIUM PHOSPHATE 10 MG/ML IJ SOLN
INTRAMUSCULAR | Status: AC
  Filled 2022-07-01: qty 1

## 2022-07-01 MED ORDER — HYDROMORPHONE HCL 1 MG/ML IJ SOLN
INTRAMUSCULAR | Status: AC
  Filled 2022-07-01: qty 1

## 2022-07-01 MED ORDER — MIDAZOLAM HCL 2 MG/2ML IJ SOLN
INTRAMUSCULAR | Status: AC
  Filled 2022-07-01: qty 2

## 2022-07-01 MED ORDER — GABAPENTIN 300 MG PO CAPS
ORAL_CAPSULE | ORAL | Status: AC
  Filled 2022-07-01: qty 1

## 2022-07-01 MED ORDER — PROPOFOL 10 MG/ML IV BOLUS
INTRAVENOUS | Status: DC | PRN
  Administered 2022-07-01: 50 mg via INTRAVENOUS
  Administered 2022-07-01: 150 mg via INTRAVENOUS
  Administered 2022-07-01: 50 mg via INTRAVENOUS

## 2022-07-01 MED ORDER — PHENAZOPYRIDINE HCL 100 MG PO TABS
200.0000 mg | ORAL_TABLET | ORAL | Status: AC
  Administered 2022-07-01: 200 mg via ORAL

## 2022-07-01 MED ORDER — ACETAMINOPHEN 325 MG PO TABS: 650.0000 mg | ORAL_TABLET | ORAL | Status: DC | PRN

## 2022-07-01 MED ORDER — FENTANYL CITRATE (PF) 250 MCG/5ML IJ SOLN
INTRAMUSCULAR | Status: AC
  Filled 2022-07-01: qty 5

## 2022-07-01 MED ORDER — SIMETHICONE 80 MG PO CHEW: 80.0000 mg | CHEWABLE_TABLET | Freq: Four times a day (QID) | ORAL | Status: DC | PRN

## 2022-07-01 MED ORDER — LIDOCAINE-EPINEPHRINE 1 %-1:100000 IJ SOLN
INTRAMUSCULAR | Status: DC | PRN
  Administered 2022-07-01: 14 mL

## 2022-07-01 MED ORDER — BUPIVACAINE HCL (PF) 0.25 % IJ SOLN
INTRAMUSCULAR | Status: DC | PRN
  Administered 2022-07-01: 15 mL

## 2022-07-01 MED ORDER — EPHEDRINE SULFATE (PRESSORS) 50 MG/ML IJ SOLN
INTRAMUSCULAR | Status: DC | PRN
  Administered 2022-07-01 (×2): 10 mg via INTRAVENOUS
  Administered 2022-07-01: 5 mg via INTRAVENOUS

## 2022-07-01 MED ORDER — MIDAZOLAM HCL 5 MG/5ML IJ SOLN
INTRAMUSCULAR | Status: DC | PRN
  Administered 2022-07-01: 2 mg via INTRAVENOUS

## 2022-07-01 MED ORDER — ONDANSETRON HCL 4 MG/2ML IJ SOLN: 4.0000 mg | Freq: Four times a day (QID) | INTRAMUSCULAR | Status: DC | PRN

## 2022-07-01 MED ORDER — ROCURONIUM BROMIDE 10 MG/ML (PF) SYRINGE
PREFILLED_SYRINGE | INTRAVENOUS | Status: AC
  Filled 2022-07-01: qty 10

## 2022-07-01 MED ORDER — POVIDONE-IODINE 10 % EX SWAB: 2.0000 | Freq: Once | CUTANEOUS | Status: DC

## 2022-07-01 MED ORDER — SODIUM CHLORIDE 0.9 % IR SOLN
Status: DC | PRN
  Administered 2022-07-01 (×2): 1000 mL

## 2022-07-01 MED ORDER — PHENAZOPYRIDINE HCL 100 MG PO TABS
ORAL_TABLET | ORAL | Status: AC
  Filled 2022-07-01: qty 2

## 2022-07-01 MED ORDER — AMISULPRIDE (ANTIEMETIC) 5 MG/2ML IV SOLN: 10.0000 mg | Freq: Once | INTRAVENOUS | Status: DC | PRN

## 2022-07-01 MED ORDER — LACTATED RINGERS IV SOLN: INTRAVENOUS | Status: DC

## 2022-07-01 MED ORDER — IBUPROFEN 200 MG PO TABS: 600.0000 mg | ORAL_TABLET | Freq: Four times a day (QID) | ORAL | Status: DC

## 2022-07-01 MED ORDER — LIDOCAINE HCL (CARDIAC) PF 100 MG/5ML IV SOSY
PREFILLED_SYRINGE | INTRAVENOUS | Status: DC | PRN
  Administered 2022-07-01: 60 mg via INTRAVENOUS

## 2022-07-01 MED ORDER — PHENYLEPHRINE HCL (PRESSORS) 10 MG/ML IV SOLN
INTRAVENOUS | Status: DC | PRN
  Administered 2022-07-01 (×2): 80 ug via INTRAVENOUS

## 2022-07-01 MED ORDER — GLYCOPYRROLATE 0.2 MG/ML IJ SOLN
INTRAMUSCULAR | Status: DC | PRN
  Administered 2022-07-01: .2 mg via INTRAVENOUS

## 2022-07-01 MED ORDER — STERILE WATER FOR IRRIGATION IR SOLN
Status: DC | PRN
  Administered 2022-07-01: 500 mL

## 2022-07-01 MED ORDER — CEFAZOLIN SODIUM-DEXTROSE 2-4 GM/100ML-% IV SOLN
INTRAVENOUS | Status: AC
  Filled 2022-07-01: qty 100

## 2022-07-01 MED ORDER — DEXAMETHASONE SODIUM PHOSPHATE 4 MG/ML IJ SOLN
INTRAMUSCULAR | Status: DC | PRN
  Administered 2022-07-01: 5 mg via INTRAVENOUS

## 2022-07-01 MED ORDER — KETOROLAC TROMETHAMINE 30 MG/ML IJ SOLN
INTRAMUSCULAR | Status: DC | PRN
  Administered 2022-07-01: 30 mg via INTRAVENOUS

## 2022-07-01 MED ORDER — ACETAMINOPHEN 500 MG PO TABS
1000.0000 mg | ORAL_TABLET | ORAL | Status: AC
  Administered 2022-07-01: 1000 mg via ORAL

## 2022-07-01 MED ORDER — EPHEDRINE 5 MG/ML INJ
INTRAVENOUS | Status: AC
  Filled 2022-07-01: qty 5

## 2022-07-01 MED ORDER — GLYCOPYRROLATE PF 0.2 MG/ML IJ SOSY
PREFILLED_SYRINGE | INTRAMUSCULAR | Status: AC
  Filled 2022-07-01: qty 1

## 2022-07-01 MED ORDER — FENTANYL CITRATE (PF) 100 MCG/2ML IJ SOLN
INTRAMUSCULAR | Status: AC
  Filled 2022-07-01: qty 2

## 2022-07-01 MED ORDER — ONDANSETRON HCL 4 MG PO TABS: 4.0000 mg | ORAL_TABLET | Freq: Four times a day (QID) | ORAL | Status: DC | PRN

## 2022-07-01 MED ORDER — PROMETHAZINE HCL 25 MG/ML IJ SOLN: 6.2500 mg | INTRAMUSCULAR | Status: DC | PRN

## 2022-07-01 MED ORDER — HYDROMORPHONE HCL 1 MG/ML IJ SOLN
0.2500 mg | INTRAMUSCULAR | Status: DC | PRN
  Administered 2022-07-01 (×3): 0.25 mg via INTRAVENOUS

## 2022-07-01 SURGICAL SUPPLY — 100 items
ADH SKN CLS APL DERMABOND .7 (GAUZE/BANDAGES/DRESSINGS) ×4
AGENT HMST KT MTR STRL THRMB (HEMOSTASIS)
APL PRP STRL LF DISP 70% ISPRP (MISCELLANEOUS) ×4
BLADE CLIPPER SENSICLIP SURGIC (BLADE) ×4 IMPLANT
BLADE SURG 15 STRL LF DISP TIS (BLADE) ×4 IMPLANT
BLADE SURG 15 STRL SS (BLADE) ×4
CATH FOLEY 3WAY  5CC 16FR (CATHETERS) ×4
CATH FOLEY 3WAY 5CC 16FR (CATHETERS) ×4 IMPLANT
CHLORAPREP W/TINT 26 (MISCELLANEOUS) ×4 IMPLANT
COVER BACK TABLE 60X90IN (DRAPES) ×4 IMPLANT
COVER TIP SHEARS 8 DVNC (MISCELLANEOUS) ×4 IMPLANT
COVER TIP SHEARS 8MM DA VINCI (MISCELLANEOUS) ×4
DEFOGGER SCOPE WARMER CLEARIFY (MISCELLANEOUS) ×4 IMPLANT
DERMABOND ADVANCED .7 DNX12 (GAUZE/BANDAGES/DRESSINGS) ×4 IMPLANT
DRAPE ARM DVNC X/XI (DISPOSABLE) ×16 IMPLANT
DRAPE COLUMN DVNC XI (DISPOSABLE) ×4 IMPLANT
DRAPE DA VINCI XI ARM (DISPOSABLE) ×16
DRAPE DA VINCI XI COLUMN (DISPOSABLE) ×4
DRAPE SHEET LG 3/4 BI-LAMINATE (DRAPES) ×4 IMPLANT
DRAPE SURG IRRIG POUCH 19X23 (DRAPES) ×4 IMPLANT
DRAPE UTILITY XL STRL (DRAPES) ×4 IMPLANT
ELECT REM PT RETURN 9FT ADLT (ELECTROSURGICAL) ×4
ELECTRODE REM PT RTRN 9FT ADLT (ELECTROSURGICAL) ×4 IMPLANT
GAUZE 4X4 16PLY ~~LOC~~+RFID DBL (SPONGE) ×4 IMPLANT
GLOVE BIOGEL PI IND STRL 6.5 (GLOVE) ×16 IMPLANT
GLOVE BIOGEL PI IND STRL 7.0 (GLOVE) ×8 IMPLANT
GLOVE ECLIPSE 6.0 STRL STRAW (GLOVE) ×12 IMPLANT
GOWN STRL REUS W/TWL LRG LVL3 (GOWN DISPOSABLE) ×4 IMPLANT
HIBICLENS CHG 4% 4OZ BTL (MISCELLANEOUS) ×4 IMPLANT
HOLDER FOLEY CATH W/STRAP (MISCELLANEOUS) ×4 IMPLANT
IRRIG SUCT STRYKERFLOW 2 WTIP (MISCELLANEOUS) ×4
IRRIGATION SUCT STRKRFLW 2 WTP (MISCELLANEOUS) ×4 IMPLANT
IV NS 1000ML (IV SOLUTION) ×8
IV NS 1000ML BAXH (IV SOLUTION) IMPLANT
KIT PINK PAD W/HEAD ARE REST (MISCELLANEOUS) ×4
KIT PINK PAD W/HEAD ARM REST (MISCELLANEOUS) ×4 IMPLANT
KIT TURNOVER CYSTO (KITS) ×4 IMPLANT
LEGGING LITHOTOMY PAIR STRL (DRAPES) ×4 IMPLANT
MANIFOLD NEPTUNE II (INSTRUMENTS) ×4 IMPLANT
MANIPULATOR ADVINCU DEL 2.5 PL (MISCELLANEOUS) IMPLANT
MANIPULATOR ADVINCU DEL 3.0 PL (MISCELLANEOUS) IMPLANT
MANIPULATOR ADVINCU DEL 3.5 PL (MISCELLANEOUS) IMPLANT
MANIPULATOR ADVINCU DEL 4.0 PL (MISCELLANEOUS) IMPLANT
MESH VERTESSA LITE -Y 2X4X3 (Mesh General) IMPLANT
NDL INSUFFLATION 14GA 120MM (NEEDLE) ×4 IMPLANT
NEEDLE HYPO 22GX1.5 SAFETY (NEEDLE) ×4 IMPLANT
NEEDLE INSUFFLATION 14GA 120MM (NEEDLE) ×4 IMPLANT
OBTURATOR OPTICAL STANDARD 8MM (TROCAR) ×4
OBTURATOR OPTICAL STND 8 DVNC (TROCAR) ×4
OBTURATOR OPTICALSTD 8 DVNC (TROCAR) ×4 IMPLANT
PACK CYSTO (CUSTOM PROCEDURE TRAY) ×4 IMPLANT
PACK ROBOT WH (CUSTOM PROCEDURE TRAY) ×4 IMPLANT
PACK ROBOTIC GOWN (GOWN DISPOSABLE) ×4 IMPLANT
PACK VAGINAL WOMENS (CUSTOM PROCEDURE TRAY) ×4 IMPLANT
PAD OB MATERNITY 4.3X12.25 (PERSONAL CARE ITEMS) ×4 IMPLANT
PAD PREP 24X48 CUFFED NSTRL (MISCELLANEOUS) ×4 IMPLANT
POUCH LAPAROSCOPIC INSTRUMENT (MISCELLANEOUS) IMPLANT
PROTECTOR NERVE ULNAR (MISCELLANEOUS) ×4 IMPLANT
RETRACTOR LONE STAR DISPOSABLE (INSTRUMENTS) ×4 IMPLANT
RETRACTOR STAY HOOK 5MM (MISCELLANEOUS) ×4 IMPLANT
SEAL CANN UNIV 5-8 DVNC XI (MISCELLANEOUS) ×20 IMPLANT
SEAL XI 5MM-8MM UNIVERSAL (MISCELLANEOUS) ×20
SEALER VESSEL DA VINCI XI (MISCELLANEOUS) ×4
SEALER VESSEL EXT DVNC XI (MISCELLANEOUS) IMPLANT
SET IRRIG Y TYPE TUR BLADDER L (SET/KITS/TRAYS/PACK) ×4 IMPLANT
SET TUBE SMOKE EVAC HIGH FLOW (TUBING) ×4 IMPLANT
SPIKE FLUID TRANSFER (MISCELLANEOUS) ×8 IMPLANT
SUCTION FRAZIER HANDLE 10FR (MISCELLANEOUS)
SUCTION TUBE FRAZIER 10FR DISP (MISCELLANEOUS) ×4 IMPLANT
SURGIFLO W/THROMBIN 8M KIT (HEMOSTASIS) IMPLANT
SUT ABS MONO DBL WITH NDL 48IN (SUTURE) IMPLANT
SUT GORETEX NAB #0 THX26 36IN (SUTURE) ×4 IMPLANT
SUT MNCRL 0 MO-4 VIOLET 18 CR (SUTURE) IMPLANT
SUT MNCRL 0 VIOLET 6X18 (SUTURE) IMPLANT
SUT MNCRL AB 3-0 PS2 27 (SUTURE) IMPLANT
SUT MNCRL AB 4-0 PS2 18 (SUTURE) ×4 IMPLANT
SUT MON AB 2-0 SH 27 (SUTURE) ×4 IMPLANT
SUT MONOCRYL 0 6X18 (SUTURE) ×4
SUT MONOCRYL 0 MO 4 18  CR/8 (SUTURE) ×4
SUT PDS AB 0 CT1 27 (SUTURE) IMPLANT
SUT PDS AB 0 CTX 60 (SUTURE) IMPLANT
SUT V-LOC BARB 180 2/0GR9 GS23 (SUTURE) ×8
SUT VIC AB 0 CT1 27 (SUTURE) ×4
SUT VIC AB 0 CT1 27XBRD ANBCTR (SUTURE) IMPLANT
SUT VIC AB 0 CT1 27XBRD ANTBC (SUTURE) ×8 IMPLANT
SUT VIC AB 1 CT1 36 (SUTURE) IMPLANT
SUT VIC AB 2-0 CT1 (SUTURE) IMPLANT
SUT VIC AB 2-0 SH 27 (SUTURE) ×4
SUT VIC AB 2-0 SH 27XBRD (SUTURE) ×4 IMPLANT
SUT VIC AB 3-0 SH 18 (SUTURE) IMPLANT
SUT VICRYL 2-0 SH 8X27 (SUTURE) IMPLANT
SUT VLOC 180 0 9IN  GS21 (SUTURE)
SUT VLOC 180 0 9IN GS21 (SUTURE) IMPLANT
SUT VLOC 180 2-0 9IN GS21 (SUTURE) IMPLANT
SUTURE V-LC BRB 180 2/0GR9GS23 (SUTURE) ×8 IMPLANT
SYR BULB EAR ULCER 3OZ GRN STR (SYRINGE) ×4 IMPLANT
SYS SLING ADV FIT BLUE TRNSVAG (Sling) IMPLANT
TOWEL OR 17X26 10 PK STRL BLUE (TOWEL DISPOSABLE) ×4 IMPLANT
TRAY FOLEY W/BAG SLVR 14FR (SET/KITS/TRAYS/PACK) ×4 IMPLANT
WATER STERILE IRR 500ML POUR (IV SOLUTION) IMPLANT

## 2022-07-01 NOTE — Anesthesia Preprocedure Evaluation (Signed)
Anesthesia Evaluation  Patient identified by MRN, date of birth, ID band Patient awake    Reviewed: Allergy & Precautions, H&P , NPO status , Patient's Chart, lab work & pertinent test results, reviewed documented beta blocker date and time   Airway Mallampati: II  TM Distance: >3 FB Neck ROM: full    Dental no notable dental hx.    Pulmonary neg pulmonary ROS   Pulmonary exam normal breath sounds clear to auscultation       Cardiovascular Exercise Tolerance: Good negative cardio ROS Normal cardiovascular exam Rhythm:regular Rate:Normal     Neuro/Psych    Depression    negative neurological ROS  negative psych ROS   GI/Hepatic Neg liver ROS,GERD  Controlled,,  Endo/Other  negative endocrine ROS    Renal/GU negative Renal ROS  negative genitourinary   Musculoskeletal  (+) Arthritis , Osteoarthritis,    Abdominal   Peds  Hematology negative hematology ROS (+)   Anesthesia Other Findings   Reproductive/Obstetrics negative OB ROS                             Anesthesia Physical Anesthesia Plan  ASA: 2  Anesthesia Plan: General   Post-op Pain Management: Tylenol PO (pre-op)*, Gabapentin PO (pre-op)* and Dilaudid IV   Induction:   PONV Risk Score and Plan: 3 and Ondansetron, Dexamethasone, Midazolam and Treatment may vary due to age or medical condition  Airway Management Planned: Oral ETT  Additional Equipment:   Intra-op Plan:   Post-operative Plan: Extubation in OR  Informed Consent: I have reviewed the patients History and Physical, chart, labs and discussed the procedure including the risks, benefits and alternatives for the proposed anesthesia with the patient or authorized representative who has indicated his/her understanding and acceptance.     Dental Advisory Given  Plan Discussed with: CRNA and Anesthesiologist  Anesthesia Plan Comments:         Anesthesia  Quick Evaluation

## 2022-07-01 NOTE — Interval H&P Note (Signed)
History and Physical Interval Note:  07/01/2022 9:03 AM  Madeline Moon  has presented today for surgery, with the diagnosis of anterior vaginal prolapse; prolapse of posterior vagina; uterovaginal prolapse incomplete; stress urinary incontinence.  The various methods of treatment have been discussed with the patient and family. After consideration of risks, benefits and other options for treatment, the patient has consented to  Procedure(s) with comments: XI ROBOTIC ASSISTED TOTAL HYSTERECTOMY WITH BILATERAL SALPINGO-OOPHORECTOMY AND SACROCOLPOPEXY (N/A)  TRANSVAGINAL TAPE (TVT) PROCEDURE (N/A) CYSTOSCOPY (N/A) PERINEOPLASTY (POSSIBLE) (N/A) as a surgical intervention.  The patient's history has been reviewed, patient examined, no change in status, stable for surgery.  I have reviewed the patient's chart and labs.  Questions were answered to the patient's satisfaction.     Jaquita Folds

## 2022-07-01 NOTE — Op Note (Signed)
Operative Note  Preoperative Diagnosis: anterior vaginal prolapse, posterior vaginal prolapse, uterovaginal prolapse, incomplete, and stress urinary incontinence  Postoperative Diagnosis: same  Procedures performed:  Robotic assisted total laparoscopic hystererectomy, bilateral salpingo-oophorectomy, sacrocolpopexy Lorna Few Lite Y mesh), midurethral sling (Advantage Fit), cystoscopy, perineorrhaphy  Implants:  Implant Name Type Inv. Item Serial No. Manufacturer Lot No. LRB No. Used Action  MESH Grayland Ormond [id number] Mesh General MESH Arlys John  [id number] [facility] MEDICAL [id number] N/A 1 Implanted  SYS SLING ADV FIT BLUE TRNSVAG - [id number] Sling SYS SLING ADV FIT BLUE TRNSVAG  [employer] [id number] N/A 1 Implanted    Attending Surgeon: Lanetta Inch, MD  Anesthesia: General endotracheal  Findings: 1. On vaginal exam, stage II prolapse noted  2. On laparoscopy, normal appearing uterus and bilateral ovaries. Left fallopian tube with evidence of tubal ligation. Right fallopian tube absent or not complete, possible remnant present.  Omental adhesions present in the right inguinal area. Colon was adhesed to the left pelvic sidewall.   3. On cystoscopy, normal bladder and urethra without injury, lesion or foreign body. Brisk bilateral ureteral efflux noted.  Specimens:  ID Type Source Tests Collected by Time Destination  1 : cervix, uterus, bilateral fallopian tubes and ovaries Tissue PATH Gyn benign resection SURGICAL PATHOLOGY Marguerita Beards, MD 07/01/2022 1142     Estimated blood loss: 75 mL  IV fluids: 1000 mL  Urine output: 200 mL  Complications: Sacrocolpopexy mesh tail tore after placement of sacral sutures. A mesh remnant was reattached to the tail to extend the length and it was reattached to the sacrum.   Procedure in Detail:  After informed consent was obtained, the patient was taken to the operating room, where general anesthesia was induced and  found to be adequate. She was placed in dorsolithotomy position in yellowfin stirrups. Her hips were noted not to be hyperflexed or hyperextended. Her arms were padded with gel pads and tucked to her sides. Her hands were surrounded by foam. A padded strap was placed across her chest with foam between the pad and her skin. She was noted to be appropriately positioned with all pressure points well padded and off tension. A tilt test showed no slippage. She was prepped and draped in the usual sterile fashion. A uterine manipulator was placed in the uterus after sounding to 6 cm, an appropriately sized Koh ring was placed around the cervix, and a pneumo-occluder balloon was positioned in the vagina for later use.  A sterile Foley catheter was inserted.   0.25% plain Marcaine was injected in the supraumbilical  area and an 8 mm supraumbilical skin incision was made with the scalpel.  A Veress needle was inserted into the incision, CO2 insufflation was started, a low opening pressure was noted, and pneumoperitoneum was obtained. The Veress needle was removed and a 50mm robotic trocar was placed. The robotic camera was inserted and intraperitoneal placement was confirmed. Survey of the abdomen and pelvis revealed the findings as noted above. The sacrum appeared to be free of any adhesive disease. After determining placement for the other ports, Local anesthetic was injected at each site and two 8 mm incisions were made for robotic ports at 10 cm lateral to and at the level of the umbilical port. Two additional 8 mm incisions were made 10 cm lateral to these and 30 degrees down followed by 8 mm robotic ports - the right side for an assistant port. All trocars were placed sequentially under direct visualization of  the camera. The patient was placed in Trendelenburg. The robot was docked on the patient's left side. Monopolar endoshears alternating with vessel sealer was placed in the right arm, a [place] bipolar grasper  was placed in the 2nd arm of the patient's left side, and a Tip up grasper was placed in the 3rd arm on the patient's left side.   Attention was then turned to the robotic hysterectomy and BSO. The ureter was identified and was found to be well away from the planned site of incision.The infundibulopelvic ligament was cauterized and transected. The right round ligament was grasped, cauterized, and transected with electrocautery.  Using the monopolar scissors, a window was made on the posterior leaf of the broad ligament. The anterior and posterior leaves of the broad ligament were taken down with cautery and sharp dissection. The uterine artery was skeletonized and the bladder flap was created on the right side with a combination of electrosurgery and sharp dissection. The KOH ring was identified. The right uterine artery was clamped, cauterized, and transected. In a similar fashion, the left side was taken down. Further sharp dissection with combination of cautery was performed to further develop the bladder flap. At this point, the KOH ring was completely hugging the cervix. The pneumo-occluder balloon in the vagina was inflated to maintain pneumoperitoneum. A colpotomy was performed with electrosurgical cutting current and the uterus and cervix were completely amputated from the vagina. The specimen was delivered through the vagina. The posterior portion of the vaginal cuff was then grasped and pulled up to maintain pneumoperitoneum. The pneumo-occluder balloon was then replaced in the vagina. The right hand instrument was changed to a suture-cut needle driver. The vaginal cuff was then closed using a 0 V-lock suture in two layers.    The right hand instrument was replaced with monopolar endoshears. With a lucite probe in the vagina, the anterior vaginal dissection was then performed with sharp dissection and electrosurgery. The posterior vaginal dissection was then performed with sharp dissection and  electrosurgery in order to dissect the rectum away from the posterior vagina. Attention was then turned to the sacral promontory. The peritoneum overlying the sacral promontory was tented up, dissected sharply with monopolar scissors and electrosurgery using layer by layer technique. The peritoneal incision was extended down to the posterior cul-de-sac. This was performed with care to avoid the ureter on the right side and the sigmoid colon and its mesentary on the left side.  A "Y" mesh was then inserted into the abdomen after trimming to appropriate size. With the probe in the vagina, the anterior leaf of the Y mesh was affixed to the anterior portion of the vagina using a 2-0 v-loc suture in a spiral pattern to distribute the suture evenly across the surface of the anterior mesh leaf. In a similar fashion, the posterior leaf of the Y mesh was attached to the posterior surface of the vagina with 2-0 v-loc suture.  The distal end of the mesh was then brought to overlie the sacrum. The correct amount of tension was determined in order to elevate the vagina, but not put the mesh under tension. The distal end of the mesh was then affixed to the anterior longitudinal sacral ligament using two interrupted transverse stitches of CV2 Gortex. The excess distal mesh was then cut and removed. The peritoneum was reapproximated over the mesh using 2-0 monocryl. As we were approaching the peritoneum over the tail, it was noted that the mesh tail had torn and separated from one  of the sacral sutures. The mesh tail was cut and the sutures removed from the sacrum. The previously cut remnant of mesh was reattached to the mesh tail with three interrupted gortex sutures to extend the length of the tail. The lucite probe was placed back in the vagina to support the apex, and the tail of the mesh was reattached to the sacrum with two interrupted CV2 Gortex sutures. The peritoneum was further closed completely retroperitonealize the  mesh. All pedicles were carefully inspected and noted to be hemostatic as the CO2 gas was deflated. All instruments were removed from the patient's abdomen.   The Foley catheter was removed.  A 70-degree cystoscope was introduced, and 360-degree inspection revealed no injury, lesion or foreign body in the bladder. Brisk bilateral ureteral efflux was noted with the assistance of pyridium.  The bladder was drained and the cystoscope was removed.  The Foley catheter was replaced.  The robot was undocked. The CO2 gas was removed and the ports were removed.  The skin incisions were closed with subcutaneous stitches of 4-0 Monocryl and covered with skin glue.    Attention was turned to the sling. A lonestar self-retraining retractor was placed with 4 stay hooks. The mid urethral area was located on the anterior vaginal wall.  Two Allis clamps were placed at the level of the midurethra. 1% lidocaine with epinephrine was injected into the vaginal mucosa. A vertical incision was made between the two clamps using a 15-blade scalpel.  Using sharp dissection, Metzenbaum scissors were used to make a periurethral tunnel from the vaginal incision towards the pubic rami bilaterally for the future sling tracts. The bladder was ensured to be empty. The trocar and attached sling were introduced into the right side of the periurethral vaginal incision, just inferior to the pubic symphysis on the right side. The trocar was guided through the endopelvic fascia and directly vertically.  While hugging the cephalad surface of the pubic bone, the trocar was guided out through the abdomen 2 fingerbreadths lateral to midline at the level of the pubic symphysis on the ipsilateral side. The trocar was placed on the left side in a similar fashion.  A 70-degree cystoscope was introduced, and 360-degree inspection revealed no trauma or trocars in the bladder, with brisk bilateral ureteral efflux.  The bladder was drained and the cystoscope  was removed.  The Foley catheter was reinserted.  The sling was brought to lie beneath the mid-urethra.  A needle driver was placed behind the sling to ensure no tension.   The plastic sheath was removed from the sling and the distal ends of the sling were trimmed just below the level of the skin incisions.  Tension-free positioning of the sling was confirmed. Vaginal inspection revealed no vaginotomy or sling perforations of the mucosa.  The vaginal mucosal edges were reapproximated using 2-0 Vicryl.   The suprapubic sling incisions were closed with Dermabond.   Attention was then turned to the perineum. Two allis clamps were placed at the introitus. The perineum was injected with 1% lidocaine with epinephrine. A diamond shaped incision was made over the perineum and excess skin was removed. Dissection was performed with Metzenbaum scissors to separate the mucosa from the underlying tissue. The perineal body was then reapproximated with two interrupted 0-vicryl sutures. The perineal skin was then closed with a 2-0 vicryl in a subcutaneous and subcuticular fashion. Good hemostasis was noted. The patient tolerated the procedure well. She was awoken and taken to the recovery room in  stable condition. Needle and sponge count was correct x2.   Jaquita Folds, MD

## 2022-07-01 NOTE — Discharge Instructions (Addendum)

## 2022-07-01 NOTE — Anesthesia Procedure Notes (Signed)
Procedure Name: Intubation Date/Time: 07/01/2022 9:55 AM  Performed by: Justice Rocher, CRNAPre-anesthesia Checklist: Patient identified, Emergency Drugs available, Suction available, Patient being monitored and Timeout performed Patient Re-evaluated:Patient Re-evaluated prior to induction Oxygen Delivery Method: Circle system utilized Preoxygenation: Pre-oxygenation with 100% oxygen Induction Type: IV induction Ventilation: Mask ventilation without difficulty Laryngoscope Size: Mac and 3 Grade View: Grade II Tube type: Oral Tube size: 7.0 mm Number of attempts: 1 Airway Equipment and Method: Stylet and Oral airway Placement Confirmation: ETT inserted through vocal cords under direct vision, positive ETCO2, breath sounds checked- equal and bilateral and CO2 detector Secured at: 23 cm Tube secured with: Tape Dental Injury: Teeth and Oropharynx as per pre-operative assessment

## 2022-07-01 NOTE — Transfer of Care (Signed)
Immediate Anesthesia Transfer of Care Note  Patient: Madeline Moon  Procedure(s) Performed: Procedure(s) (LRB): XI ROBOTIC ASSISTED TOTAL HYSTERECTOMY WITH BILATERAL SALPINGO-OOPHORECTOMY AND SACROCOLPOPEXY (N/A) TRANSVAGINAL TAPE (TVT) PROCEDURE (N/A) CYSTOSCOPY (N/A) PERINEOPLASTY (N/A)  Patient Location: PACU  Anesthesia Type: General  Level of Consciousness: awake, sedated, patient cooperative and responds to stimulation  Airway & Oxygen Therapy: Patient Spontanous Breathing and Patient connected to [place] oxygen  Post-op Assessment: Report given to PACU RN, Post -op Vital signs reviewed and stable and Patient moving all extremities  Post vital signs: Reviewed and stable  Complications: No apparent anesthesia complications

## 2022-07-01 NOTE — Anesthesia Postprocedure Evaluation (Signed)
Anesthesia Post Note  Patient: Madeline Moon  Procedure(s) Performed: XI ROBOTIC ASSISTED TOTAL HYSTERECTOMY WITH BILATERAL SALPINGO-OOPHORECTOMY AND SACROCOLPOPEXY (Abdomen) TRANSVAGINAL TAPE (TVT) PROCEDURE (Vagina ) CYSTOSCOPY (Urethra) PERINEOPLASTY     Patient location during evaluation: PACU Anesthesia Type: General Level of consciousness: awake and alert and oriented Pain management: pain level controlled Vital Signs Assessment: post-procedure vital signs reviewed and stable Respiratory status: spontaneous breathing, nonlabored ventilation and respiratory function stable Cardiovascular status: blood pressure returned to baseline and stable Postop Assessment: no apparent nausea or vomiting Anesthetic complications: no   No notable events documented.  Last Vitals:  Vitals:   07/01/22 1447 07/01/22 1449  BP: (!) 125/104 125/76  Pulse: 82 69  Resp: 16 11  Temp:    SpO2: 100% 98%    Last Pain:  Vitals:   07/01/22 1449  TempSrc:   PainSc: 3                  Dezyrae Kensinger A.

## 2022-07-02 ENCOUNTER — Encounter (HOSPITAL_BASED_OUTPATIENT_CLINIC_OR_DEPARTMENT_OTHER): Payer: Self-pay | Admitting: Obstetrics and Gynecology

## 2022-07-02 ENCOUNTER — Telehealth: Payer: Self-pay | Admitting: Obstetrics and Gynecology

## 2022-07-02 NOTE — Telephone Encounter (Signed)
Madeline Moon underwent Robotic assisted total laparoscopic hystererectomy, bilateral salpingo-oophorectomy, sacrocolpopexy Erenest Blank Lite Y mesh), midurethral sling (Advantage Fit), cystoscopy, perineorrhaphy on 07/01/22.   She failed her voiding trial.  378ml was backfilled into the bladder Voided 184ml  PVR by bladder scan was 216ml.   She was discharged with a catheter. Please call her for a routine post op check and to schedule a voiding trial by Thursday or Friday this week. Thanks!  Jaquita Folds, MD

## 2022-07-02 NOTE — Anesthesia Postprocedure Evaluation (Signed)
Anesthesia Post Note  Patient: Madeline Moon  Procedure(s) Performed: XI ROBOTIC ASSISTED TOTAL HYSTERECTOMY WITH BILATERAL SALPINGO-OOPHORECTOMY AND SACROCOLPOPEXY (Abdomen) TRANSVAGINAL TAPE (TVT) PROCEDURE (Vagina ) CYSTOSCOPY (Urethra) PERINEOPLASTY     Patient location during evaluation: PACU Anesthesia Type: General Level of consciousness: awake and alert Pain management: pain level controlled Vital Signs Assessment: post-procedure vital signs reviewed and stable Respiratory status: spontaneous breathing, nonlabored ventilation and respiratory function stable Cardiovascular status: blood pressure returned to baseline and stable Postop Assessment: no apparent nausea or vomiting Anesthetic complications: no   No notable events documented.  Last Vitals:  Vitals:   07/01/22 1630 07/01/22 1945  BP: 107/64 118/77  Pulse: 65 65  Resp: 14 15  Temp: (!) 35.6 C (!) 36.3 C  SpO2: 100% 98%    Last Pain:  Vitals:   07/01/22 1945  TempSrc: Oral  PainSc: [place]

## 2022-07-02 NOTE — Telephone Encounter (Signed)
Called and spoke to patient. She reports pain 2/10. She reports she is taking her medication for pain as needed.  States she has been managing her catheter well and plans to clean it today. She has not yet had a bowel movement. Plan for voiding trial on Thursday afternoon.  Encouraged patient to call office if she has any concerns or questions.

## 2022-07-03 LAB — SURGICAL PATHOLOGY

## 2022-07-04 ENCOUNTER — Ambulatory Visit (INDEPENDENT_AMBULATORY_CARE_PROVIDER_SITE_OTHER): Payer: 59

## 2022-07-04 DIAGNOSIS — Z48816 Encounter for surgical aftercare following surgery on the genitourinary system: Secondary | ICD-10-CM

## 2022-07-04 NOTE — Patient Instructions (Signed)
Please keep for future post op appointment.

## 2022-07-04 NOTE — Progress Notes (Signed)
Urogyn Nurse Voiding Trial Note  Madeline Moon underwent XI ROBOTIC ASSISTED TOTAL HYSTERECTOMY WITH BILATERAL SALPINGO-OOPHORECTOMY AND SACROCOLPOPEXY, TRANSVAGINAL TAPE (TVT) PROCEDURE , CYSTOSCOPY, PERINEOPLASTY on 07/01/22.  Madeline Moon presents today for a voiding trial .   Patient was identified with 2 indicators. 285ml of NS was instilled into the bladder via the catheter. The catheter was removed and patient was instructed to void into the urinary hat. Madeline Moon voided 224ml. The post void residual measured by bladder scan was 60ml. Madeline Moon passed the voiding trial and a catheter was not replaced.   The patient received aftercare instructions and will follow up as scheduled.

## 2022-07-09 ENCOUNTER — Encounter: Payer: Self-pay | Admitting: Obstetrics and Gynecology

## 2022-07-09 ENCOUNTER — Ambulatory Visit (INDEPENDENT_AMBULATORY_CARE_PROVIDER_SITE_OTHER): Payer: 59 | Admitting: Obstetrics and Gynecology

## 2022-07-09 VITALS — BP 115/77 | HR 74

## 2022-07-09 DIAGNOSIS — R52 Pain, unspecified: Secondary | ICD-10-CM

## 2022-07-09 MED ORDER — ESTRADIOL 0.1 MG/GM VA CREA: TOPICAL_CREAM | VAGINAL | 11 refills | Status: DC

## 2022-07-09 NOTE — Progress Notes (Signed)
Madeline Moon  Date of Visit: 07/09/2022  History of Present Illness: Madeline Moon is a 62 Moon.o. female scheduled today for a post-operative visit.   Surgery: s/p Robotic assisted total laparoscopic Moon, Madeline Moon, Madeline Moon mesh), Madeline sling (Advantage Fit), Madeline Moon, perineorrhaphy   on 07/01/22  She presents today for irritation at the opening of her vaginal area. She feels a little pain there sometimes, but it will come and go. She was not sure if she should be using any zinc cream there. Has seen a little brown spotting but no bleeding.   Medications: She has a current medication list which includes the following prescription(s): calcium carbonate, [START ON 07/11/2022] estradiol, NON FORMULARY, vitamin d, and pseudoephedrine hcl.   Allergies: Patient is allergic to morphine and oxycodone.   Physical Exam: BP 115/77   Pulse 74   Pelvic Examination: Perineal incision slightly separated, mild erythema. No discharge, sutures appear intact. Vagina: suburethral and cuff incisions intact. No visible or palpable mesh.  POP-Q: deferred  ---------------------------------------------------------  Assessment and Plan:  1. Persistent wound pain     - Small separation of perineal incision. Will start vaginal estrogen to promote wound healing. Place 0.5g nightly for two weeks then twice a week after that.   Return 4 weeks for post op visit or sooner if needed  Jaquita Folds, MD

## 2022-07-22 ENCOUNTER — Encounter: Payer: Self-pay | Admitting: *Deleted

## 2022-08-05 ENCOUNTER — Encounter: Payer: Self-pay | Admitting: Family Medicine

## 2022-08-05 ENCOUNTER — Ambulatory Visit: Payer: 59 | Admitting: Family Medicine

## 2022-08-05 VITALS — BP 108/62 | HR 74 | Ht 65.0 in | Wt 152.0 lb

## 2022-08-05 DIAGNOSIS — J0120 Acute ethmoidal sinusitis, unspecified: Secondary | ICD-10-CM | POA: Diagnosis not present

## 2022-08-05 MED ORDER — AZITHROMYCIN 250 MG PO TABS: ORAL_TABLET | ORAL | 0 refills | Status: AC

## 2022-08-05 MED ORDER — PROMETHAZINE-DM 6.25-15 MG/5ML PO SYRP: 5.0000 mL | ORAL_SOLUTION | Freq: Four times a day (QID) | ORAL | 0 refills | Status: DC | PRN

## 2022-08-05 NOTE — Patient Instructions (Signed)
-   Start antibiotics and take for full course - Start over-the-counter Mucinex 12-hour and take twice daily while on antibiotics - Can use Rx cough medicine on as-needed basis - Contact us for any questions, concerns, new symptoms, and otherwise follow-up as needed

## 2022-08-05 NOTE — Progress Notes (Signed)
     Primary Care / Sports Medicine Office Visit  Patient Information:  Patient ID: Madeline Moon, female DOB: [birth year] Age: 62 y.o. MRN: [id number]   Madeline Moon is a pleasant 62 y.o. female presenting with the following:  Chief Complaint  Patient presents with   URI    Sore throat, cough, unable to sleep. For 1 week    Vitals:   08/05/22 1309  BP: 108/62  Pulse: 74  SpO2: 98%   Vitals:   08/05/22 1309  Weight: 152 lb (68.9 kg)  Height: '5\' 5"'$  (1.651 m)   Body mass index is 25.29 kg/m.  No results found.   Independent interpretation of notes and tests performed by another provider:   None  Procedures performed:   None  Pertinent History, Exam, Impression, and Recommendations:   Madeline Moon was seen today for uri.  Acute ethmoidal sinusitis, recurrence not specified Assessment & Plan: Acute, ongoing x 1 week, initially began with congestion, drainage, throat pain, progressive nonproductive cough, tactile fevers. COVID test in clinic negative, examination with erythematous turbinates, tenderness about the ethmoid regions, tympanic membranes, canals, oropharynx benign, left cervical chain nontender lymphadenopathy.  Her lung fields are clear throughout without wheezes, rales, or rhonchi.  Given duration of symptoms, findings, adenoidal sinusitis of concern, azithromycin, OTC mucolytic, and as needed antitussive discussed.  She can follow-up as needed.  Orders: -     Promethazine-DM; Take 5 mLs by mouth 4 (four) times daily as needed for cough.  Dispense: 118 mL; Refill: 0 -     Azithromycin; Take 2 tablets on day 1, then 1 tablet daily on days 2 through 5  Dispense: 6 tablet; Refill: 0     Orders & Medications Meds ordered this encounter  Medications   promethazine-dextromethorphan (PROMETHAZINE-DM) 6.25-15 MG/5ML syrup    Sig: Take 5 mLs by mouth 4 (four) times daily as needed for cough.    Dispense:  118 mL    Refill:  0   azithromycin  (ZITHROMAX) 250 MG tablet    Sig: Take 2 tablets on day 1, then 1 tablet daily on days 2 through 5    Dispense:  6 tablet    Refill:  0   No orders of the defined types were placed in this encounter.    Return if symptoms worsen or fail to improve.     Montel Culver, MD, [facility]   Primary Care Sports Medicine Primary Care and Sports Medicine at [facility]

## 2022-08-05 NOTE — Assessment & Plan Note (Signed)
Acute, ongoing x 1 week, initially began with congestion, drainage, throat pain, progressive nonproductive cough, tactile fevers. COVID test in clinic negative, examination with erythematous turbinates, tenderness about the ethmoid regions, tympanic membranes, canals, oropharynx benign, left cervical chain nontender lymphadenopathy.  Her lung fields are clear throughout without wheezes, rales, or rhonchi.  Given duration of symptoms, findings, adenoidal sinusitis of concern, azithromycin, OTC mucolytic, and as needed antitussive discussed.  She can follow-up as needed.

## 2022-08-06 ENCOUNTER — Encounter: Payer: 59 | Admitting: Obstetrics and Gynecology

## 2022-08-12 ENCOUNTER — Encounter: Payer: 59 | Admitting: Obstetrics and Gynecology

## 2022-08-12 ENCOUNTER — Ambulatory Visit (INDEPENDENT_AMBULATORY_CARE_PROVIDER_SITE_OTHER): Payer: 59 | Admitting: Obstetrics and Gynecology

## 2022-08-12 ENCOUNTER — Encounter: Payer: Self-pay | Admitting: Obstetrics and Gynecology

## 2022-08-12 VITALS — BP 124/84 | HR 99

## 2022-08-12 DIAGNOSIS — Z9889 Other specified postprocedural states: Secondary | ICD-10-CM

## 2022-08-12 NOTE — Progress Notes (Signed)
[facility]  Date of Visit: 08/12/2022  History of Present Illness: Ms. Henrick is a 62 y.o. female scheduled today for a post-operative visit.   Surgery: s/p Robotic assisted total laparoscopic hystererectomy, bilateral salpingo-oophorectomy, sacrocolpopexy Erenest Blank Lite Y mesh), midurethral sling (Advantage Fit), cystoscopy, perineorrhaphy   on 07/01/22   She did not pass her postoperative void trial. Returned to office 07/04/22 and passed voiding trial  Postoperative course has been uncomplicated. Was seen a few weeks ago for perineal pain and estrogen cream was started.   Today she reports still has some irritation around the opening of the vagina, feels irritated. Cannot wipe with toilet paper. She used the vaginal estrogen consistently for the first two weeks, but not often since then. Feels a "bump" at the rectum and has some bowel urgency. But has had some looser stools even before surgery due to diet. Feels she is about 85% better.   UTI in the last 6 weeks? No  Pain? No  but has had some tenderness above the pubic bone. She has returned to her normal activity (except for postop restrictions) Vaginal bulge? No  Stress incontinence: No  Urgency/frequency: No  Urge incontinence: Yes - sometimes with a full bladder, but has improved over time. Only drinks coffee occasionally now which has improved.  Voiding dysfunction: No  Bowel issues: Yes see above  Subjective Success: Do you usually have a bulge or something falling out that you can see or feel in the vaginal area? No  Retreatment Success: Any retreatment with surgery or pessary for any compartment? No   Pathology results: UTERUS, CERVIX, BILATERAL FALLOPIAN TUBES, HYSTERECTOMY AND  SALPINGECTOMY:  - Cervix: Benign, nabothian cyst  - Endometrium: Benign inactive endometrium  - Myometrium: No significant pathologic changes  - Bilateral fallopian tubes and ovaries: No significant pathologic  changes    Medications: She has a current medication list which includes the following prescription(s): calcium carbonate, estradiol, NON FORMULARY, promethazine-dextromethorphan, and vitamin d.   Allergies: Patient is allergic to morphine and oxycodone.   Physical Exam: BP 124/84   Pulse 99   Abdomen: soft, non-tender, without masses or organomegaly Laparoscopic Incisions: healing well.  Pelvic Examination: Vagina: Incisions healing well. Small amount of erythema at the introitus but well healed. Sutures are present at the cuff and there is not granulation tissue. No tenderness along the anterior or posterior vagina. No apical tenderness. No pelvic masses. No visible or palpable mesh.  POP-Q: POP-Q  -3                                            Aa   -3                                           Ba  -8                                              C   3  Gh  4.5                                            Pb  8                                            tvl   -2                                            Ap  -2                                            Bp                                                 D     ---------------------------------------------------------  Assessment and Plan:  1. Post-operative state    - Healing well overall. Continue to use estrogen at introitus twice a week.  - Pathology results were reviewed with the patient today and she verbalized understanding that the results were benign.  - Can resume regular activity including exercise. Wait an additional 6 weeks for intercourse.  - Discussed avoidance of heavy lifting and straining long term to reduce the risk of recurrence.   Return as needed  Jaquita Folds, MD

## 2022-09-24 ENCOUNTER — Encounter: Payer: Self-pay | Admitting: Internal Medicine

## 2022-09-24 ENCOUNTER — Other Ambulatory Visit
Admission: RE | Admit: 2022-09-24 | Discharge: 2022-09-24 | Disposition: A | Discharge status: Home / Self Care | Payer: 59 | Attending: Internal Medicine | Admitting: Internal Medicine

## 2022-09-24 ENCOUNTER — Ambulatory Visit (INDEPENDENT_AMBULATORY_CARE_PROVIDER_SITE_OTHER): Payer: 59 | Admitting: Internal Medicine

## 2022-09-24 VITALS — BP 116/70 | HR 73 | Ht 65.0 in | Wt 157.0 lb

## 2022-09-24 DIAGNOSIS — Z0000 Encounter for general adult medical examination without abnormal findings: Secondary | ICD-10-CM | POA: Diagnosis not present

## 2022-09-24 DIAGNOSIS — Z1231 Encounter for screening mammogram for malignant neoplasm of breast: Secondary | ICD-10-CM

## 2022-09-24 DIAGNOSIS — E559 Vitamin D deficiency, unspecified: Secondary | ICD-10-CM

## 2022-09-24 DIAGNOSIS — R7303 Prediabetes: Secondary | ICD-10-CM

## 2022-09-24 DIAGNOSIS — E785 Hyperlipidemia, unspecified: Secondary | ICD-10-CM | POA: Diagnosis not present

## 2022-09-24 DIAGNOSIS — K219 Gastro-esophageal reflux disease without esophagitis: Secondary | ICD-10-CM | POA: Insufficient documentation

## 2022-09-24 DIAGNOSIS — Z9079 Acquired absence of other genital organ(s): Secondary | ICD-10-CM

## 2022-09-24 DIAGNOSIS — Z90722 Acquired absence of ovaries, bilateral: Secondary | ICD-10-CM | POA: Diagnosis not present

## 2022-09-24 DIAGNOSIS — Z90710 Acquired absence of both cervix and uterus: Secondary | ICD-10-CM | POA: Diagnosis not present

## 2022-09-24 LAB — COMPREHENSIVE METABOLIC PANEL
ALT: 15 U/L (ref 0–44)
AST: 18 U/L (ref 15–41)
Albumin: 4.1 g/dL (ref 3.5–5.0)
Alkaline Phosphatase: 70 U/L (ref 38–126)
Anion gap: 9 (ref 5–15)
BUN: 22 mg/dL (ref 8–23)
CO2: 24 mmol/L (ref 22–32)
Calcium: 8.8 mg/dL — ABNORMAL LOW (ref 8.9–10.3)
Chloride: 100 mmol/L (ref 98–111)
Creatinine, Ser: 0.79 mg/dL (ref 0.44–1.00)
GFR, Estimated: >60 mL/min (ref >60)
Glucose, Bld: 102 mg/dL — ABNORMAL HIGH (ref 70–99)
Potassium: 4.3 mmol/L (ref 3.5–5.1)
Sodium: 133 mmol/L — ABNORMAL LOW (ref 135–145)
Total Bilirubin: 0.6 mg/dL (ref 0.3–1.2)
Total Protein: 7.3 g/dL (ref 6.5–8.1)

## 2022-09-24 LAB — CBC WITH DIFFERENTIAL/PLATELET
Abs Immature Granulocytes: 0.01 10*3/uL (ref 0.00–0.07)
Basophils Absolute: 0 10*3/uL (ref 0.0–0.1)
Basophils Relative: 1 %
Eosinophils Absolute: 0.1 10*3/uL (ref 0.0–0.5)
Eosinophils Relative: 3 %
HCT: 46 % (ref 36.0–46.0)
Hemoglobin: 15.3 g/dL — ABNORMAL HIGH (ref 12.0–15.0)
Immature Granulocytes: 0 %
Lymphocytes Relative: 29 %
Lymphs Abs: 1 10*3/uL (ref 0.7–4.0)
MCH: 28.7 pg (ref 26.0–34.0)
MCHC: 33.3 g/dL (ref 30.0–36.0)
MCV: 86.3 fL (ref 80.0–100.0)
Monocytes Absolute: 0.3 10*3/uL (ref 0.1–1.0)
Monocytes Relative: 8 %
Neutro Abs: 2 10*3/uL (ref 1.7–7.7)
Neutrophils Relative %: 59 %
Platelets: 222 10*3/uL (ref 150–400)
RBC: 5.33 MIL/uL — ABNORMAL HIGH (ref 3.87–5.11)
RDW: 14.6 % (ref 11.5–15.5)
WBC: 3.4 10*3/uL — ABNORMAL LOW (ref 4.0–10.5)
nRBC: 0 % (ref 0.0–0.2)

## 2022-09-24 LAB — TSH: TSH: 1.141 u[IU]/mL (ref 0.350–4.500)

## 2022-09-24 LAB — LIPID PANEL
Cholesterol: 282 mg/dL — ABNORMAL HIGH (ref 0–200)
HDL: 70 mg/dL (ref >40)
LDL Cholesterol: 196 mg/dL — ABNORMAL HIGH (ref 0–99)
Total CHOL/HDL Ratio: 4 RATIO
Triglycerides: 80 mg/dL (ref <150)
VLDL: 16 mg/dL (ref 0–40)

## 2022-09-24 LAB — VITAMIN D 25 HYDROXY (VIT D DEFICIENCY, FRACTURES): Vit D, 25-Hydroxy: 37.07 ng/mL (ref 30–100)

## 2022-09-24 NOTE — Assessment & Plan Note (Signed)
Controlled with diet changes and weight loss.

## 2022-09-24 NOTE — Assessment & Plan Note (Signed)
Symptoms well controlled with PRN TUMS No red flag signs such as weight loss, n/v, melena

## 2022-09-24 NOTE — Progress Notes (Signed)
Date:  09/24/2022   Name:  Madeline Moon   DOB:  07-Sep-1960   MRN:  [id number]   Chief Complaint: Annual Exam Madeline Moon is a 62 y.o. female who presents today for her Complete Annual Exam. She feels well. She reports exercising - some. She reports she is sleeping fairly well. Breast complaints - none.  Mammogram: 11/2021 DEXA: none Pap smear: 10/2018 neg/neg - s/p TAH 2023 Colonoscopy: 10/2021 repeat 10 yrs  Health Maintenance Due  Topic Date Due   COVID-19 Vaccine (1) Never done    Immunization History  Administered Date(s) Administered   Influenza,inj,Quad PF,6+ Mos 04/05/2016   Tdap 08/10/2015   Zoster Recombinat (Shingrix) 10/06/2018, 01/14/2019    HPI  Lab Results  Component Value Date   NA 138 09/19/2021   K 4.1 09/19/2021   CO2 26 09/19/2021   GLUCOSE 103 (H) 09/19/2021   BUN 26 (H) 09/19/2021   CREATININE 0.88 09/19/2021   CALCIUM 8.7 (L) 09/19/2021   EGFR 82 10/09/2020   GFRNONAA >60 09/19/2021   Lab Results  Component Value Date   CHOL 338 (H) 09/19/2021   HDL 81 09/19/2021   LDLCALC 248 (H) 09/19/2021   TRIG 44 09/19/2021   CHOLHDL 4.2 09/19/2021   Lab Results  Component Value Date   TSH 2.014 09/19/2021   Lab Results  Component Value Date   HGBA1C 5.5 09/19/2021   Lab Results  Component Value Date   WBC 4.8 06/27/2022   HGB 13.5 06/27/2022   HCT 42.3 06/27/2022   MCV 86.2 06/27/2022   PLT 206 06/27/2022   Lab Results  Component Value Date   ALT 26 09/19/2021   AST 24 09/19/2021   ALKPHOS 91 09/19/2021   BILITOT 0.4 09/19/2021   No results found for: "25OHVITD2", "25OHVITD3", "VD25OH"   Review of Systems  Constitutional:  Negative for chills, fatigue and fever.  HENT:  Negative for congestion, hearing loss, tinnitus, trouble swallowing and voice change.   Eyes:  Negative for visual disturbance.  Respiratory:  Negative for cough, chest tightness, shortness of breath and wheezing.   Cardiovascular:  Negative for  chest pain, palpitations and leg swelling.  Gastrointestinal:  Negative for abdominal pain, constipation, diarrhea and vomiting.  Endocrine: Negative for polydipsia and polyuria.  Genitourinary:  Negative for dysuria, frequency, genital sores, vaginal bleeding and vaginal discharge.  Musculoskeletal:  Negative for arthralgias, gait problem and joint swelling.       Palmar contractures on both hands  Skin:  Negative for color change and rash.  Neurological:  Negative for dizziness, tremors, light-headedness and headaches.  Hematological:  Negative for adenopathy. Does not bruise/bleed easily.  Psychiatric/Behavioral:  Negative for dysphoric mood and sleep disturbance. The patient is not nervous/anxious.     Patient Active Problem List   Diagnosis Date Noted   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) 09/24/2022   Uterovaginal prolapse, incomplete 07/01/2022   Prediabetes 10/08/2020   Chronic right shoulder pain 02/28/2020   Elevated blood pressure reading 10/07/2019   Gastroesophageal reflux disease without esophagitis 10/06/2018   Palmar fascial fibromatosis 10/06/2018   Hyperlipidemia, mild 08/12/2016   Mixed urge and stress incontinence 11/09/2014   Osteoarthritis 11/09/2014    Allergies  Allergen Reactions   Morphine Itching    Nose itching and burning. Patient stated she would take Morphine if she really needed it.   Oxycodone Itching    Past Surgical History:  Procedure Laterality Date   BLADDER SUSPENSION N/A 07/01/2022  Procedure: TRANSVAGINAL TAPE (TVT) PROCEDURE;  Surgeon: Marguerita Beards, MD;  Location[facility];  Service: Gynecology;  Laterality: N/A;   COLONOSCOPY  2013   COLONOSCOPY  2011   COLONOSCOPY WITH PROPOFOL N/A 10/08/2021   Procedure: COLONOSCOPY WITH PROPOFOL;  Surgeon: Midge Minium, MD;  Location: [facility];  Service: Endoscopy;  Laterality: N/A;   CYSTOSCOPY N/A 07/01/2022    Procedure: CYSTOSCOPY;  Surgeon: Marguerita Beards, MD;  Location: [facility];  Service: Gynecology;  Laterality: N/A;   PERINEOPLASTY N/A 07/01/2022   Procedure: PERINEOPLASTY;  Surgeon: Marguerita Beards, MD;  Location: [facility];  Service: Gynecology;  Laterality: N/A;   RIGHT OOPHORECTOMY     pt not sure if her ovary was removed or just her tube   TUBAL LIGATION     XI ROBOTIC ASSISTED TOTAL HYSTERECTOMY WITH SACROCOLPOPEXY N/A 07/01/2022   Procedure: XI ROBOTIC ASSISTED TOTAL HYSTERECTOMY WITH BILATERAL SALPINGO-OOPHORECTOMY AND SACROCOLPOPEXY;  Surgeon: Marguerita Beards, MD;  Location: [facility];  Service: Gynecology;  Laterality: N/A;  total time requested is 3.5 hours    Social History   Tobacco Use   Smoking status: Never   Smokeless tobacco: Never  Vaping Use   Vaping Use: Never used  Substance Use Topics   Alcohol use: Not Currently    Comment: occasional, about one drink once a month   Drug use: No     Medication list has been reviewed and updated.  Current Meds  Medication Sig   [DISCONTINUED] estradiol (ESTRACE) 0.1 MG/GM vaginal cream Place 0.5g nightly for two weeks then twice a week after   [DISCONTINUED] levothyroxine (SYNTHROID) 25 MCG tablet Take 25 mcg by mouth daily.       09/24/2022    8:03 AM 05/23/2022   11:10 AM 03/13/2022   11:18 AM 09/19/2021   10:04 AM  GAD 7 : Generalized Anxiety Score  Nervous, Anxious, on Edge 0 0 0 0  Control/stop worrying 1 0 0 0  Worry too much - different things 1 0 0 0  Trouble relaxing 0 0 0 0  Restless 0 0 0 0  Easily annoyed or irritable 0 0 0 0  Afraid - awful might happen 0 0 0 0  Total GAD 7 Score 2 0 0 0  Anxiety Difficulty Not difficult at all Not difficult at all  Not difficult at all       09/24/2022    8:03 AM 05/23/2022   11:10 AM 03/13/2022   11:18 AM  Depression screen PHQ 2/9  Decreased Interest 0 0 0  Down, Depressed, Hopeless 0 0 0   PHQ - 2 Score 0 0 0  Altered sleeping 1 0 0  Tired, decreased energy 1 0 0  Change in appetite 1 0 0  Feeling bad or failure about yourself  0 0 0  Trouble concentrating 1 0 0  Moving slowly or fidgety/restless 0 0 0  Suicidal thoughts 0 0 0  PHQ-9 Score 4 0 0  Difficult doing work/chores Not difficult at all Not difficult at all Not difficult at all    BP Readings from Last 3 Encounters:  09/24/22 116/70  08/12/22 124/84  08/05/22 108/62    Physical Exam Vitals and nursing note reviewed.  Constitutional:      General: She is not in acute distress.    Appearance: She is well-developed.  HENT:     Head: Normocephalic and atraumatic.  Right Ear: Tympanic membrane and ear canal normal.     Left Ear: Tympanic membrane and ear canal normal.     Nose:     Right Sinus: No maxillary sinus tenderness.     Left Sinus: No maxillary sinus tenderness.  Eyes:     General: No scleral icterus.       Right eye: No discharge.        Left eye: No discharge.     Conjunctiva/sclera: Conjunctivae normal.  Neck:     Thyroid: No thyromegaly.     Vascular: No carotid bruit.  Cardiovascular:     Rate and Rhythm: Normal rate and regular rhythm.     Pulses: Normal pulses.     Heart sounds: Normal heart sounds.  Pulmonary:     Effort: Pulmonary effort is normal. No respiratory distress.     Breath sounds: No wheezing.  Chest:  Breasts:    Right: No mass, nipple discharge, skin change or tenderness.     Left: No mass, nipple discharge, skin change or tenderness.  Abdominal:     General: Bowel sounds are normal.     Palpations: Abdomen is soft.     Tenderness: There is no abdominal tenderness.  Musculoskeletal:     Cervical back: Normal range of motion. No erythema.     Right lower leg: No edema.     Left lower leg: No edema.     Comments: Palmar contractures both hands  Lymphadenopathy:     Cervical: No cervical adenopathy.  Skin:    General: Skin is warm and dry.     Findings:  No rash.  Neurological:     General: No focal deficit present.     Mental Status: She is alert and oriented to person, place, and time.     Cranial Nerves: No cranial nerve deficit.     Sensory: No sensory deficit.     Deep Tendon Reflexes: Reflexes are normal and symmetric.  Psychiatric:        Attention and Perception: Attention normal.        Mood and Affect: Mood normal.     Wt Readings from Last 3 Encounters:  09/24/22 157 lb (71.2 kg)  08/05/22 152 lb (68.9 kg)  07/01/22 152 lb 3.2 oz (69 kg)    BP 116/70   Pulse 73   Ht 5\' 5"  (1.651 m)   Wt 157 lb (71.2 kg)   SpO2 98%   BMI 26.13 kg/m   Assessment and Plan:  Problem List Items Addressed This Visit       Digestive   Gastroesophageal reflux disease without esophagitis (Chronic)    Symptoms well controlled with PRN TUMS No red flag signs such as weight loss, n/v, melena       Relevant Orders   CBC with Differential/Platelet     Other   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)   Hyperlipidemia, mild (Chronic)    Controlled with diet changes and weight loss.       Relevant Orders   Lipid panel   Prediabetes (Chronic)    Diet controlled. Lab Results  Component Value Date   HGBA1C 5.5 09/19/2021        Relevant Orders   Comprehensive metabolic panel   Hemoglobin A1c   TSH   Other Visit Diagnoses     Annual physical exam    -  Primary   Relevant Orders   CBC with Differential/Platelet   Comprehensive metabolic panel  Hemoglobin A1c   Lipid panel   VITAMIN D 25 Hydroxy (Vit-D Deficiency, Fractures)   TSH   Encounter for screening mammogram for breast cancer       Relevant Orders   MM 3D SCREENING MAMMOGRAM BILATERAL BREAST   Vitamin D deficiency       Relevant Orders   VITAMIN D 25 Hydroxy (Vit-D Deficiency, Fractures)       Return in about 1 year (around 09/24/2023) for CPX.   Partially dictated using Dragon software, any errors are not  intentional.  Reubin Milan, MD [facility] [place], [place]

## 2022-09-24 NOTE — Assessment & Plan Note (Signed)
Diet controlled. Lab Results  Component Value Date   HGBA1C 5.5 09/19/2021

## 2022-09-24 NOTE — Patient Instructions (Signed)
Call [facility] to schedule your mammogram at [phone].  

## 2022-09-25 LAB — HEMOGLOBIN A1C
Hgb A1c MFr Bld: 5.8 % — ABNORMAL HIGH (ref 4.8–5.6)
Mean Plasma Glucose: 120 mg/dL

## 2022-09-30 DIAGNOSIS — K219 Gastro-esophageal reflux disease without esophagitis: Secondary | ICD-10-CM | POA: Diagnosis not present

## 2022-09-30 DIAGNOSIS — E785 Hyperlipidemia, unspecified: Secondary | ICD-10-CM | POA: Diagnosis not present

## 2022-09-30 DIAGNOSIS — Z833 Family history of diabetes mellitus: Secondary | ICD-10-CM | POA: Diagnosis not present

## 2022-09-30 DIAGNOSIS — Z823 Family history of stroke: Secondary | ICD-10-CM | POA: Diagnosis not present

## 2022-09-30 DIAGNOSIS — Z8249 Family history of ischemic heart disease and other diseases of the circulatory system: Secondary | ICD-10-CM | POA: Diagnosis not present

## 2022-11-26 ENCOUNTER — Ambulatory Visit
Admission: RE | Admit: 2022-11-26 | Discharge: 2022-11-26 | Disposition: A | Discharge status: Home / Self Care | Payer: 59 | Source: Ambulatory Visit | Attending: Internal Medicine | Admitting: Internal Medicine

## 2022-11-26 DIAGNOSIS — Z1231 Encounter for screening mammogram for malignant neoplasm of breast: Secondary | ICD-10-CM | POA: Diagnosis not present

## 2022-11-28 ENCOUNTER — Other Ambulatory Visit: Payer: Self-pay | Admitting: Internal Medicine

## 2022-11-28 DIAGNOSIS — R928 Other abnormal and inconclusive findings on diagnostic imaging of breast: Secondary | ICD-10-CM

## 2022-11-28 DIAGNOSIS — N6489 Other specified disorders of breast: Secondary | ICD-10-CM

## 2022-12-10 ENCOUNTER — Ambulatory Visit
Admission: RE | Admit: 2022-12-10 | Discharge: 2022-12-10 | Disposition: A | Discharge status: Home / Self Care | Payer: 59 | Source: Ambulatory Visit | Attending: Internal Medicine | Admitting: Internal Medicine

## 2022-12-10 ENCOUNTER — Ambulatory Visit: Payer: Self-pay

## 2022-12-10 DIAGNOSIS — R928 Other abnormal and inconclusive findings on diagnostic imaging of breast: Secondary | ICD-10-CM

## 2022-12-10 DIAGNOSIS — N6489 Other specified disorders of breast: Secondary | ICD-10-CM | POA: Insufficient documentation

## 2022-12-10 DIAGNOSIS — R92321 Mammographic fibroglandular density, right breast: Secondary | ICD-10-CM | POA: Diagnosis not present

## 2022-12-10 NOTE — Telephone Encounter (Signed)
Chief Complaint: Skin rash Symptoms: facial rash and bend of elbow  Frequency: onset Sunday and has gotten worse  Pertinent Negatives: Patient denies pain, nausea, vomiting Disposition: [] ED /[] Urgent Care (no appt availability in office) / [x] Appointment(In office/virtual)/ []  [place] [facility]/ [] Home Care/ [] Refused Recommended Disposition /[] [place] [facility]/ []  Follow-up with PCP Additional Notes: Patient reports being out in the yard on Sunday and developing a facial rash shortly after going inside. Patient reports the rash has spread to her right elbow now. Advised patient that she needs to be evaluated. Patient was agreeable. Patient scheduled 12/12/22. Patient states she is using cream that is helpful.   Reason for Disposition  Mild widespread rash  (Exception: Heat rash lasting 3 days or less.)  Answer Assessment - Initial Assessment Questions 1. APPEARANCE of RASH: "Describe the rash." (e.g., spots, blisters, raised areas, skin peeling, scaly)     Face and the bend of the elbow left arm 2. SIZE: "How big are the spots?" (e.g., tip of pen, eraser, coin; inches, centimeters)     Covering most the right side of the face. 3. LOCATION: "Where is the rash located?"     My face and right elbow 4. COLOR: "What color is the rash?" (Note: It is difficult to assess rash color in people with darker-colored skin. When this situation occurs, simply ask the caller to describe what they see.)     Red and feels like leather 5. ONSET: "When did the rash begin?"     Sunday 6. FEVER: "Do you have a fever?" If Yes, ask: "What is your temperature, how was it measured, and when did it start?"     No 7. ITCHING: "Does the rash itch?" If Yes, ask: "How bad is the itch?" (Scale 1-10; or mild, moderate, severe)     None 8. CAUSE: "What do you think is causing the rash?"     Being out in the sun 9. MEDICINE FACTORS: "Have you started any new medicines within the last 2 weeks?" (e.g.,  antibiotics)      No 10. OTHER SYMPTOMS: "Do you have any other symptoms?" (e.g., dizziness, headache, sore throat, joint pain)       No  Protocols used: Rash or Redness - [facility]

## 2022-12-11 ENCOUNTER — Ambulatory Visit (INDEPENDENT_AMBULATORY_CARE_PROVIDER_SITE_OTHER): Payer: 59 | Admitting: Internal Medicine

## 2022-12-11 VITALS — BP 106/64 | HR 82 | Ht 65.0 in | Wt 165.0 lb

## 2022-12-11 DIAGNOSIS — L247 Irritant contact dermatitis due to plants, except food: Secondary | ICD-10-CM

## 2022-12-11 DIAGNOSIS — L0100 Impetigo, unspecified: Secondary | ICD-10-CM | POA: Diagnosis not present

## 2022-12-11 MED ORDER — AMOXICILLIN-POT CLAVULANATE 875-125 MG PO TABS: 1.0000 | ORAL_TABLET | Freq: Two times a day (BID) | ORAL | 0 refills | Status: AC

## 2022-12-11 MED ORDER — PREDNISONE 10 MG PO TABS: ORAL_TABLET | ORAL | 0 refills | Status: DC

## 2022-12-11 MED ORDER — TRIAMCINOLONE ACETONIDE 0.5 % EX OINT: 1.0000 | TOPICAL_OINTMENT | Freq: Two times a day (BID) | CUTANEOUS | 0 refills | Status: DC

## 2022-12-11 NOTE — Progress Notes (Signed)
Date:  12/11/2022   Name:  Madeline Moon   DOB:  11-29-1960   MRN:  [id number]   Chief Complaint: Rash (Started Monday morning. Located on face and migrating to both arms. Patient was pulling weeds and thinks she now has poison ivy. Both itchy and painful.)  Rash Pertinent negatives include no fatigue, fever or shortness of breath.  Was working in the yard, pulling weeds.  Did not see any poison ivy but did have some plant leaves and roots hit her in the face.  She wiped the dirt away and continued work.  No known insect bite or other trauma.  Rash on arms started the next day.  Lab Results  Component Value Date   NA 133 (L) 09/24/2022   K 4.3 09/24/2022   CO2 24 09/24/2022   GLUCOSE 102 (H) 09/24/2022   BUN 22 09/24/2022   CREATININE 0.79 09/24/2022   CALCIUM 8.8 (L) 09/24/2022   EGFR 82 10/09/2020   GFRNONAA >60 09/24/2022   Lab Results  Component Value Date   CHOL 282 (H) 09/24/2022   HDL 70 09/24/2022   LDLCALC 196 (H) 09/24/2022   TRIG 80 09/24/2022   CHOLHDL 4.0 09/24/2022   Lab Results  Component Value Date   TSH 1.141 09/24/2022   Lab Results  Component Value Date   HGBA1C 5.8 (H) 09/24/2022   Lab Results  Component Value Date   WBC 3.4 (L) 09/24/2022   HGB 15.3 (H) 09/24/2022   HCT 46.0 09/24/2022   MCV 86.3 09/24/2022   PLT 222 09/24/2022   Lab Results  Component Value Date   ALT 15 09/24/2022   AST 18 09/24/2022   ALKPHOS 70 09/24/2022   BILITOT 0.6 09/24/2022   Lab Results  Component Value Date   VD25OH 37.07 09/24/2022     Review of Systems  Constitutional:  Negative for chills, fatigue and fever.  HENT:         Right cheek puffy and hot, mild itching intermittently  Respiratory:  Negative for chest tightness and shortness of breath.   Cardiovascular:  Negative for chest pain.  Skin:  Positive for rash (in both forearms).    Patient Active Problem List   Diagnosis Date Noted   S/P TAH-BSO (total abdominal hysterectomy and  bilateral salpingo-oophorectomy) 09/24/2022   Uterovaginal prolapse, incomplete 07/01/2022   Prediabetes 10/08/2020   Chronic right shoulder pain 02/28/2020   Elevated blood pressure reading 10/07/2019   Gastroesophageal reflux disease without esophagitis 10/06/2018   Palmar fascial fibromatosis 10/06/2018   Hyperlipidemia, mild 08/12/2016   Mixed urge and stress incontinence 11/09/2014   Osteoarthritis 11/09/2014    Allergies  Allergen Reactions   Morphine Itching    Nose itching and burning. Patient stated she would take Morphine if she really needed it.   Oxycodone Itching    Past Surgical History:  Procedure Laterality Date   BLADDER SUSPENSION N/A 07/01/2022   Procedure: TRANSVAGINAL TAPE (TVT) PROCEDURE;  Surgeon: Marguerita Beards, MD;  Location: [facility];  Service: Gynecology;  Laterality: N/A;   COLONOSCOPY  2013   COLONOSCOPY  2011   COLONOSCOPY WITH PROPOFOL N/A 10/08/2021   Procedure: COLONOSCOPY WITH PROPOFOL;  Surgeon: Midge Minium, MD;  Location: [facility];  Service: Endoscopy;  Laterality: N/A;   CYSTOSCOPY N/A 07/01/2022   Procedure: CYSTOSCOPY;  Surgeon: Marguerita Beards, MD;  Location: [facility];  Service: Gynecology;  Laterality: N/A;   PERINEOPLASTY N/A 07/01/2022   Procedure:  PERINEOPLASTY;  Surgeon: Marguerita Beards, MD;  Location: [facility];  Service: Gynecology;  Laterality: N/A;   RIGHT OOPHORECTOMY     pt not sure if her ovary was removed or just her tube   TUBAL LIGATION     XI ROBOTIC ASSISTED TOTAL HYSTERECTOMY WITH SACROCOLPOPEXY N/A 07/01/2022   Procedure: XI ROBOTIC ASSISTED TOTAL HYSTERECTOMY WITH BILATERAL SALPINGO-OOPHORECTOMY AND SACROCOLPOPEXY;  Surgeon: Marguerita Beards, MD;  Location: [facility];  Service: Gynecology;  Laterality: N/A;  total time requested is 3.5 hours    Social History   Tobacco Use   Smoking status: Never   Smokeless  tobacco: Never  Vaping Use   Vaping Use: Never used  Substance Use Topics   Alcohol use: Not Currently    Comment: occasional, about one drink once a month   Drug use: No     Medication list has been reviewed and updated.  Current Meds  Medication Sig   amoxicillin-clavulanate (AUGMENTIN) 875-125 MG tablet Take 1 tablet by mouth 2 (two) times daily for 10 days.   calcium carbonate (TUMS EX) 750 MG chewable tablet Chew 1 tablet by mouth as needed for heartburn.   NON FORMULARY Sodium, potassium and magnesium solution.  15 drops in AM coffee   predniSONE (DELTASONE) 10 MG tablet Take 6 tablets (60 mg total) by mouth daily with breakfast for 1 day, THEN 5 tablets (50 mg total) daily with breakfast for 1 day, THEN 4 tablets (40 mg total) daily with breakfast for 1 day, THEN 3 tablets (30 mg total) daily with breakfast for 1 day, THEN 2 tablets (20 mg total) daily with breakfast for 1 day, THEN 1 tablet (10 mg total) daily with breakfast for 1 day.   triamcinolone ointment (KENALOG) 0.5 % Apply 1 Application topically 2 (two) times daily. To rash on arms   UNABLE TO FIND Med Name: Liquid Iodine   VITAMIN D PO Take by mouth as needed. D3       12/11/2022    1:55 PM 09/24/2022    8:03 AM 05/23/2022   11:10 AM 03/13/2022   11:18 AM  GAD 7 : Generalized Anxiety Score  Nervous, Anxious, on Edge 0 0 0 0  Control/stop worrying 0 1 0 0  Worry too much - different things 0 1 0 0  Trouble relaxing 0 0 0 0  Restless 0 0 0 0  Easily annoyed or irritable 0 0 0 0  Afraid - awful might happen 0 0 0 0  Total GAD 7 Score 0 2 0 0  Anxiety Difficulty Not difficult at all Not difficult at all Not difficult at all        12/11/2022    1:55 PM 09/24/2022    8:03 AM 05/23/2022   11:10 AM  Depression screen PHQ 2/9  Decreased Interest 0 0 0  Down, Depressed, Hopeless 0 0 0  PHQ - 2 Score 0 0 0  Altered sleeping 0 1 0  Tired, decreased energy 0 1 0  Change in appetite 0 1 0  Feeling bad or failure  about yourself  0 0 0  Trouble concentrating 0 1 0  Moving slowly or fidgety/restless 0 0 0  Suicidal thoughts 0 0 0  PHQ-9 Score 0 4 0  Difficult doing work/chores Not difficult at all Not difficult at all Not difficult at all    BP Readings from Last 3 Encounters:  12/11/22 106/64  09/24/22 116/70  08/12/22 124/84  Physical Exam Vitals and nursing note reviewed.  Constitutional:      General: She is not in acute distress.    Appearance: She is well-developed.  HENT:     Head: Normocephalic and atraumatic.  Pulmonary:     Effort: Pulmonary effort is normal. No respiratory distress.  Musculoskeletal:     Cervical back: Normal range of motion.  Lymphadenopathy:     Cervical: No cervical adenopathy.  Skin:    General: Skin is warm and dry.     Findings: No rash.     Comments: Swelling right cheek with honey colored crusts Scattered lesions with vesicles in both antecubital fossa and left wrist  Neurological:     Mental Status: She is alert and oriented to person, place, and time.  Psychiatric:        Mood and Affect: Mood normal.        Behavior: Behavior normal.     Wt Readings from Last 3 Encounters:  12/11/22 165 lb (74.8 kg)  09/24/22 157 lb (71.2 kg)  08/05/22 152 lb (68.9 kg)    BP 106/64   Pulse 82   Ht 5\' 5"  (1.651 m)   Wt 165 lb (74.8 kg)   SpO2 98%   BMI 27.46 kg/m   Assessment and Plan:  Problem List Items Addressed This Visit   None Visit Diagnoses     Impetigo    -  Primary   likely triggered by skin disruption while gardening use cool compresses for symptoms relief follow up not improveing   Relevant Medications   amoxicillin-clavulanate (AUGMENTIN) 875-125 MG tablet   Irritant contact dermatitis due to plants, except food       avoid further contact use topical steroids cream PRN steroid taper   Relevant Medications   predniSONE (DELTASONE) 10 MG tablet   triamcinolone ointment (KENALOG) 0.5 %       No follow-ups on file.    Partially dictated using Dragon software, any errors are not intentional.  Reubin Milan, MD [facility] [place], [place]

## 2022-12-12 ENCOUNTER — Ambulatory Visit: Payer: Self-pay

## 2022-12-12 ENCOUNTER — Ambulatory Visit: Payer: 59 | Admitting: Internal Medicine

## 2022-12-12 NOTE — Telephone Encounter (Signed)
  Chief Complaint: Question from pt Symptoms:  Frequency:  Pertinent Negatives: Patient denies  Disposition: [] ED /[] Urgent Care (no appt availability in office) / [] Appointment(In office/virtual)/ []  [facility]/ [] Home Care/ [] Refused Recommended Disposition /[] [facility]/ [x]  Follow-up with PCP Additional Notes: Pt was wondering if she will be contagious this weekend.  PT has started abx and will keep areas covered. Encouraged frequent handwashing.  Pt should not be contagious, but still observe caution. From protocol: * WORK AND SCHOOL: For mild impetigo (1 or 2 sores), you can attend school or work if it is covered. For severe impetigo, you need to take an oral antibiotic for over 24 hours before returning to work.  Summary: pt want to know if she will be contagious   Pt states that she was seen yesterday and was diagnosed with a staff infection or poison ivy and is wanting to know if she would be contagious this weekend. She has a family wedding to go to. Please call pt back.     Reason for Disposition  Health Information question, no triage required and triager able to answer question  Answer Assessment - Initial Assessment Questions 1. REASON FOR CALL or QUESTION: "What is your reason for calling today?" or "How can I best help you?" or "What question do you have that I can help answer?"     Will I be contagious this weekend.  Protocols used: Information Only Call - No Triage-A-AH

## 2022-12-16 ENCOUNTER — Encounter: Payer: Self-pay | Admitting: Internal Medicine

## 2022-12-16 ENCOUNTER — Ambulatory Visit (INDEPENDENT_AMBULATORY_CARE_PROVIDER_SITE_OTHER): Payer: 59 | Admitting: Internal Medicine

## 2022-12-16 ENCOUNTER — Ambulatory Visit: Payer: Self-pay | Admitting: *Deleted

## 2022-12-16 VITALS — BP 122/66 | HR 87 | Ht 65.0 in | Wt 165.0 lb

## 2022-12-16 DIAGNOSIS — L0100 Impetigo, unspecified: Secondary | ICD-10-CM

## 2022-12-16 DIAGNOSIS — L270 Generalized skin eruption due to drugs and medicaments taken internally: Secondary | ICD-10-CM | POA: Diagnosis not present

## 2022-12-16 DIAGNOSIS — L247 Irritant contact dermatitis due to plants, except food: Secondary | ICD-10-CM

## 2022-12-16 MED ORDER — PREDNISONE 10 MG PO TABS: ORAL_TABLET | ORAL | 0 refills | Status: AC

## 2022-12-16 NOTE — Telephone Encounter (Signed)
Please review.  KP

## 2022-12-16 NOTE — Telephone Encounter (Signed)
Message from Dr. Judithann Graves read to pt, verbalizes understanding. Pt states rash is "Wrapping around back now." States she would like to be seen. Please advise.

## 2022-12-16 NOTE — Telephone Encounter (Signed)
  Chief Complaint: Seen 7/10.   Rash is spreading. Symptoms: Face much improved.   Rash has spread to abd and neck and place in bend of her arm is worse and itching real bad.  Taking prescribed medications along with Benadryl every 4 hours but it doesn't seem to be working now. Frequency: Rash spreading and itching real bad Pertinent Negatives: Patient denies having the pus on her face.   The puffiness has cleared up on her face too.  Disposition: [] ED /[] Urgent Care (no appt availability in office) / [] Appointment(In office/virtual)/ []  [facility]/ [] Home Care/ [] Refused Recommended Disposition /[] [facility]/ [x]  Follow-up with PCP Additional Notes: Since pt seen recently 7/10 a message has been sent to Dr. Judithann Graves.    Pt was agreeable to someone calling her back with Dr. Karn Cassis suggestion.

## 2022-12-16 NOTE — Progress Notes (Signed)
Date:  12/16/2022   Name:  Madeline Moon   DOB:  August 02, 1960   MRN:  [id number]   Chief Complaint: Rash  HPI Treated for poison ivy with steroid taper.  Lesions improved but then new ones appeared. These are very pruritic.  She is using topical steroid ointment with some benefit.  Facial cellulitis - treated with Augmentin with significant improvement.  Also used some otc steroid cream for itching.  Rash on abdomen, neck and back is new.  Mild itching and redness with warmth where clothes rub.   Lab Results  Component Value Date   NA 133 (L) 09/24/2022   K 4.3 09/24/2022   CO2 24 09/24/2022   GLUCOSE 102 (H) 09/24/2022   BUN 22 09/24/2022   CREATININE 0.79 09/24/2022   CALCIUM 8.8 (L) 09/24/2022   EGFR 82 10/09/2020   GFRNONAA >60 09/24/2022   Lab Results  Component Value Date   CHOL 282 (H) 09/24/2022   HDL 70 09/24/2022   LDLCALC 196 (H) 09/24/2022   TRIG 80 09/24/2022   CHOLHDL 4.0 09/24/2022   Lab Results  Component Value Date   TSH 1.141 09/24/2022   Lab Results  Component Value Date   HGBA1C 5.8 (H) 09/24/2022   Lab Results  Component Value Date   WBC 3.4 (L) 09/24/2022   HGB 15.3 (H) 09/24/2022   HCT 46.0 09/24/2022   MCV 86.3 09/24/2022   PLT 222 09/24/2022   Lab Results  Component Value Date   ALT 15 09/24/2022   AST 18 09/24/2022   ALKPHOS 70 09/24/2022   BILITOT 0.6 09/24/2022   Lab Results  Component Value Date   VD25OH 37.07 09/24/2022     Review of Systems  Constitutional:  Negative for chills, fatigue and fever.  Respiratory:  Negative for chest tightness, shortness of breath and wheezing.   Cardiovascular:  Negative for chest pain.  Skin:  Positive for color change and rash.    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 Patient stated she would take Morphine if she really needed it.   Oxycodone Itching    Past Surgical History:  Procedure Laterality Date   BLADDER SUSPENSION N/A 07/01/2022   Procedure: TRANSVAGINAL TAPE (TVT) PROCEDURE;  Surgeon: Marguerita Beards, MD;  Location: [facility];  Service: Gynecology;  Laterality: N/A;   COLONOSCOPY  2013   COLONOSCOPY  2011   COLONOSCOPY WITH PROPOFOL N/A 10/08/2021   Procedure: COLONOSCOPY WITH PROPOFOL;  Surgeon: Midge Minium, MD;  [facility];  Service: Endoscopy;  Laterality: N/A;   CYSTOSCOPY N/A 07/01/2022   Procedure: CYSTOSCOPY;  Surgeon: Marguerita Beards, MD;  Location: [facility];  Service: Gynecology;  Laterality: N/A;   PERINEOPLASTY N/A 07/01/2022   Procedure: PERINEOPLASTY;  Surgeon: Marguerita Beards, MD;  Location: [facility];  Service: Gynecology;  Laterality: N/A;   RIGHT OOPHORECTOMY     pt not sure if her  ovary was removed or just her tube   TUBAL LIGATION     XI ROBOTIC ASSISTED TOTAL HYSTERECTOMY WITH SACROCOLPOPEXY N/A 07/01/2022   Procedure: XI ROBOTIC ASSISTED TOTAL HYSTERECTOMY WITH BILATERAL SALPINGO-OOPHORECTOMY AND SACROCOLPOPEXY;  Surgeon: Marguerita Beards, MD;  Location: [facility];  Service: Gynecology;  Laterality: N/A;  total time requested is 3.5 hours    Social History   Tobacco Use   Smoking status: Never   Smokeless tobacco: Never  Vaping Use   Vaping status: Never Used  Substance Use Topics   Alcohol use: Not  Currently    Comment: occasional, about one drink once a month   Drug use: No     Medication list has been reviewed and updated.  Current Meds  Medication Sig   amoxicillin-clavulanate (AUGMENTIN) 875-125 MG tablet Take 1 tablet by mouth 2 (two) times daily for 10 days.   calcium carbonate (TUMS EX) 750 MG chewable tablet Chew 1 tablet by mouth as needed for heartburn.   NON FORMULARY Sodium, potassium and magnesium solution.  15 drops in AM coffee   predniSONE (DELTASONE) 10 MG tablet Take 6 tablets (60 mg total) by mouth daily with breakfast for 2 days, THEN 5 tablets (50 mg total) daily with breakfast for 2 days, THEN 4 tablets (40 mg total) daily with breakfast for 2 days, THEN 3 tablets (30 mg total) daily with breakfast for 2 days, THEN 2 tablets (20 mg total) daily with breakfast for 2 days, THEN 1 tablet (10 mg total) daily with breakfast for 2 days.   triamcinolone ointment (KENALOG) 0.5 % Apply 1 Application topically 2 (two) times daily. To rash on arms   UNABLE TO FIND Med Name: Liquid Iodine   VITAMIN D PO Take by mouth as needed. D3   [DISCONTINUED] predniSONE (DELTASONE) 10 MG tablet Take 6 tablets (60 mg total) by mouth daily with breakfast for 1 day, THEN 5 tablets (50 mg total) daily with breakfast for 1 day, THEN 4 tablets (40 mg total) daily with breakfast for 1 day, THEN 3 tablets (30 mg total) daily with breakfast for 1 day, THEN 2 tablets (20 mg total) daily with breakfast for 1 day, THEN 1 tablet (10 mg total) daily with breakfast for 1 day.       12/11/2022    1:55 PM 09/24/2022    8:03 AM 05/23/2022   11:10 AM 03/13/2022   11:18 AM  GAD 7 : Generalized Anxiety Score  Nervous, Anxious, on Edge 0 0 0 0  Control/stop worrying 0 1 0 0  Worry too much - different things 0 1 0 0  Trouble relaxing 0 0 0 0  Restless 0 0 0 0  Easily annoyed or irritable 0 0 0 0  Afraid - awful might happen 0 0 0 0  Total GAD 7 Score 0 2 0 0  Anxiety Difficulty Not difficult at all  Not difficult at all Not difficult at all        12/11/2022    1:55 PM 09/24/2022    8:03 AM 05/23/2022   11:10 AM  Depression screen PHQ 2/9  Decreased Interest 0 0 0  Down, Depressed, Hopeless 0 0 0  PHQ - 2 Score 0 0 0  Altered sleeping 0 1 0  Tired, decreased energy 0 1 0  Change in appetite 0 1 0  Feeling bad or failure about yourself  0 0 0  Trouble concentrating 0 1 0  Moving slowly or fidgety/restless  0 0 0  Suicidal thoughts 0 0 0  PHQ-9 Score 0 4 0  Difficult doing work/chores Not difficult at all Not difficult at all Not difficult at all    BP Readings from Last 3 Encounters:  12/16/22 122/66  12/11/22 106/64  09/24/22 116/70    Physical Exam Vitals and nursing note reviewed.  Constitutional:      General: She is not in acute distress.    Appearance: Normal appearance. She is well-developed.  HENT:     Head: Normocephalic and atraumatic.  Cardiovascular:     Rate and Rhythm: Normal rate and regular rhythm.  Pulmonary:     Effort: Pulmonary effort is normal. No respiratory distress.     Breath sounds: No wheezing or rhonchi.  Musculoskeletal:     Cervical back: Normal range of motion.  Lymphadenopathy:     Cervical: No cervical adenopathy.  Skin:    General: Skin is warm and dry.     Findings: Rash present.     Comments: Fine macular rash around neck and lower abdomen to the back c/w drug reaction.  Inflamed skin both antecubital fossae, vesicular lesions in a linear fashion left wrist - c/w contact dermatitis  Mild redness right cheek - induration and crusting has resolved.  Neurological:     Mental Status: She is alert and oriented to person, place, and time.  Psychiatric:        Mood and Affect: Mood normal.        Behavior: Behavior normal.     Wt Readings from Last 3 Encounters:  12/16/22 165 lb (74.8 kg)  12/11/22 165 lb (74.8 kg)  09/24/22 157 lb (71.2 kg)    BP 122/66   Pulse 87   Ht 5\' 5"  (1.651 m)   Wt 165 lb (74.8 kg)   SpO2  97%   BMI 27.46 kg/m   Assessment and Plan:  Problem List Items Addressed This Visit   None Visit Diagnoses     Drug eruption    -  Primary   likely due to Augmentin stop antibiotic - follow up if no improvement   Irritant contact dermatitis due to plants, except food       recurrent or re-contact most likely will give extended steroid taper continue topical steroid ointment   Relevant Medications   predniSONE (DELTASONE) 10 MG tablet   Impetigo       95% resolved; stop Augmentin due to concern over drug reaction       No follow-ups on file.    Reubin Milan, MD [facility] [place]

## 2022-12-16 NOTE — Telephone Encounter (Signed)
Summary: Poison ivy, seeking appt with PCP today   Pt has poison ivy and wants to be seen again today with PCP, she was seen last week for this same issue. She declined the next avaialble 12/17/2022 at 3pm because she wants to be seen today.  Best contact:  [phone]          Call History  Contact Date/Time Type Contact Phone/Fax User  12/16/2022 08:06 AM EDT Phone (Incoming) Madeline Moon "Madeline Moon" (Self) [phone] Madeline Moon) Randol Kern   Reason for Disposition  Hives or itching    Seen 7/10 for same issue but the rash is spreading.   Being treated for poison ivy and a staph infection as a result of the poison ivy.  Answer Assessment - Initial Assessment Questions 1. APPEARANCE of RASH: "Describe the rash." (e.g., spots, blisters, raised areas, skin peeling, scaly)     I'm still having the rash I was seen for last week 7/10.   It's spreading onto my neck and abd now.    It's still itching really bad.   I could not sleep last night. Dr. Judithann Graves is treating me for poison ivy with the prednisone which has cleared up my face.    The puffiness is gone and the pus.   It's still hot and leathery feeling but much better.   I'm taking the Benadryl every 4 hours but it does not seem to be helping now.  The rash in the bend of my arm has gotten worse.   This rash is itching so bad.     Now it's on my neck and abd and worse in the bend of my arm.  She gave me an antibiotic for a staph infection that is a result of the poison ivy.  It doesn't seem to be helping.     Hand and Foot diease is going around at the school where my daughter is.   I know that is very contagious.    I'm not having any symptoms of Hand and Foot disease.    My daughter is not having any symptoms of it either.   I don't feel that's what this is I have but thought I would mention it anyway.   2. SIZE: "How big are the spots?" (e.g., tip of pen, eraser, coin; inches, centimeters)     The area in the bend of my arm is  worse and spreading. 3. LOCATION: "Where is the rash located?"     It's now on my neck and abd and the rash in the bend of my arm is worse.   My face is no longer puffy.   The prednisone seems to have helped my face.   No longer having pus. 4. COLOR: "What color is the rash?" (Note: It is difficult to assess rash color in people with darker-colored skin. When this situation occurs, simply ask the caller to describe what they see.)     Same as when I was seen last week. 5. ONSET: "When did the rash begin?"     Not asked 6. FEVER: "Do you have a fever?" If Yes, ask: "What is your temperature, how was it measured, and when did it start?"     Not asked 7. ITCHING: "Does the rash itch?" If Yes, ask: "How bad is the itch?" (Scale 1-10; or mild, moderate, severe)     Yes very badly   I could not sleep last night 8. CAUSE: "What do you think is causing the  rash?"     I'm being treated for a staph infection and poison ivy 9. NEW MEDICINES: "What new medicines are you taking?" (e.g., name of antibiotic) "When did you start taking this medication?".     The medications Dr.  Judithann Graves prescribed for me during my visit on 7/10 10. OTHER SYMPTOMS: "Do you have any other symptoms?" (e.g., sore throat, fever, joint pain)       No 11. PREGNANCY: "Is there any chance you are pregnant?" "When was your last menstrual period?"       N/A due to age  Protocols used: Rash - Widespread On Drugs-A-AH

## 2023-03-17 ENCOUNTER — Encounter: Payer: Self-pay | Admitting: Internal Medicine

## 2023-03-17 ENCOUNTER — Ambulatory Visit (INDEPENDENT_AMBULATORY_CARE_PROVIDER_SITE_OTHER): Payer: 59 | Admitting: Internal Medicine

## 2023-03-17 VITALS — BP 122/78 | HR 80 | Temp 97.7°F | Ht 65.0 in | Wt 166.4 lb

## 2023-03-17 DIAGNOSIS — R059 Cough, unspecified: Secondary | ICD-10-CM

## 2023-03-17 MED ORDER — BENZONATATE 100 MG PO CAPS: 100.0000 mg | ORAL_CAPSULE | Freq: Three times a day (TID) | ORAL | 0 refills | Status: DC | PRN

## 2023-03-17 NOTE — Progress Notes (Signed)
Date:  03/17/2023   Name:  Madeline Moon   DOB:  May 22, 1961   MRN:  [id number]   Chief Complaint: Cough (This started about a week ago. Patient taking amoxicillin/clav, and tessalon perles. Getting better but still wanted to be checked out. Patient has not tested herself for covid. Patient would like tessalon perles refilled. )  Cough This is a new problem. The current episode started in the past 7 days. The problem has been gradually improving. The problem occurs every few minutes. The cough is Non-productive. Pertinent negatives include no chest pain, chills, fever, headaches, nasal congestion, postnasal drip, shortness of breath or wheezing.  Started Augmentin from her emergency health care/medication box from [facility].  Review of Systems  Constitutional:  Negative for chills and fever.  HENT:  Negative for postnasal drip, sinus pressure and trouble swallowing.   Respiratory:  Positive for cough. Negative for shortness of breath and wheezing.   Cardiovascular:  Negative for chest pain and palpitations.  Neurological:  Negative for dizziness, light-headedness and headaches.  Psychiatric/Behavioral:  Negative for dysphoric mood and sleep disturbance. The patient is not nervous/anxious.      Lab Results  Component Value Date   NA 133 (L) 09/24/2022   K 4.3 09/24/2022   CO2 24 09/24/2022   GLUCOSE 102 (H) 09/24/2022   BUN 22 09/24/2022   CREATININE 0.79 09/24/2022   CALCIUM 8.8 (L) 09/24/2022   EGFR 82 10/09/2020   GFRNONAA >60 09/24/2022   Lab Results  Component Value Date   CHOL 282 (H) 09/24/2022   HDL 70 09/24/2022   LDLCALC 196 (H) 09/24/2022   TRIG 80 09/24/2022   CHOLHDL 4.0 09/24/2022   Lab Results  Component Value Date   TSH 1.141 09/24/2022   Lab Results  Component Value Date   HGBA1C 5.8 (H) 09/24/2022   Lab Results  Component Value Date   WBC 3.4 (L) 09/24/2022   HGB 15.3 (H) 09/24/2022   HCT 46.0 09/24/2022   MCV 86.3 09/24/2022   PLT  222 09/24/2022   Lab Results  Component Value Date   ALT 15 09/24/2022   AST 18 09/24/2022   ALKPHOS 70 09/24/2022   BILITOT 0.6 09/24/2022   Lab Results  Component Value Date   VD25OH 37.07 09/24/2022     Patient Active Problem List   Diagnosis Date Noted   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 Patient stated she would take Morphine if she really needed it.   Oxycodone Itching    Past Surgical History:  Procedure Laterality Date   BLADDER SUSPENSION N/A 07/01/2022   Procedure: TRANSVAGINAL TAPE (TVT) PROCEDURE;  Surgeon: Marguerita Beards, MD;  Location: [facility];  Service: Gynecology;  Laterality: N/A;   COLONOSCOPY  2013   COLONOSCOPY  2011   COLONOSCOPY WITH PROPOFOL N/A 10/08/2021   Procedure: COLONOSCOPY WITH PROPOFOL;  Surgeon: Midge Minium, MD;  Location: [facility];  Service: Endoscopy;  Laterality: N/A;   CYSTOSCOPY N/A 07/01/2022   Procedure:  CYSTOSCOPY;  Surgeon: Marguerita Beards, MD;  [facility];  Service: Gynecology;  Laterality: N/A;   PERINEOPLASTY N/A 07/01/2022   Procedure: PERINEOPLASTY;  Surgeon: Marguerita Beards, MD;  Location: [facility];  Service: Gynecology;  Laterality: N/A;   RIGHT OOPHORECTOMY     pt not sure if her ovary was removed or just her tube   TUBAL LIGATION     XI ROBOTIC ASSISTED TOTAL HYSTERECTOMY WITH  SACROCOLPOPEXY N/A 07/01/2022   Procedure: XI ROBOTIC ASSISTED TOTAL HYSTERECTOMY WITH BILATERAL SALPINGO-OOPHORECTOMY AND SACROCOLPOPEXY;  Surgeon: Marguerita Beards, MD;  Location: [facility];  Service: Gynecology;  Laterality: N/A;  total time requested is 3.5 hours    Social History   Tobacco Use   Smoking status: Never   Smokeless tobacco: Never  Vaping Use   Vaping status: Never Used  Substance Use Topics   Alcohol use: Not Currently    Comment: occasional, about one drink once a month   Drug use: No     Medication list has been reviewed and updated.  Current Meds  Medication Sig   amoxicillin-clavulanate (AUGMENTIN) 875-125 MG tablet Take 1 tablet by mouth 2 (two) times daily.   calcium carbonate (TUMS EX) 750 MG chewable tablet Chew 1 tablet by mouth as needed for heartburn.   NON FORMULARY Sodium, potassium and magnesium solution.  15 drops in AM coffee   triamcinolone ointment (KENALOG) 0.5 % Apply 1 Application topically 2 (two) times daily. To rash on arms   UNABLE TO FIND Med Name: Liquid Iodine   VITAMIN D PO Take by mouth as needed. D3   [DISCONTINUED] benzonatate (TESSALON) 100 MG capsule Take by mouth 3 (three) times daily as needed for cough.       03/17/2023   10:28 AM 12/11/2022    1:55 PM 09/24/2022    8:03 AM 05/23/2022   11:10 AM  GAD 7 : Generalized Anxiety Score  Nervous, Anxious, on Edge 0 0 0 0  Control/stop worrying 0 0 1 0  Worry too much - different things 0 0 1 0  Trouble relaxing 0 0 0 0  Restless 0 0 0 0  Easily annoyed or irritable 0 0 0 0  Afraid - awful might happen 0 0 0 0  Total GAD 7 Score 0 0 2 0  Anxiety Difficulty Not difficult at all Not difficult at all Not difficult at all Not difficult at all       03/17/2023   10:28 AM 12/11/2022    1:55 PM 09/24/2022    8:03 AM  Depression screen PHQ 2/9  Decreased Interest 0 0 0  Down, Depressed, Hopeless 0 0 0  PHQ - 2 Score 0 0 0  Altered sleeping 0 0 1   Tired, decreased energy 1 0 1  Change in appetite 0 0 1  Feeling bad or failure about yourself  0 0 0  Trouble concentrating 0 0 1  Moving slowly or fidgety/restless 0 0 0  Suicidal thoughts 0 0 0  PHQ-9 Score 1 0 4  Difficult doing work/chores Not difficult at all Not difficult at all Not difficult at all    BP Readings from Last 3 Encounters:  03/17/23 122/78  12/16/22 122/66  12/11/22 106/64    Physical Exam Vitals and nursing note reviewed.  Constitutional:      General: She is not in acute distress.    Appearance: She is well-developed.  HENT:  Head: Normocephalic and atraumatic.  Neck:     Vascular: No carotid bruit.  Cardiovascular:     Rate and Rhythm: Normal rate and regular rhythm.  Pulmonary:     Effort: Pulmonary effort is normal. No respiratory distress.     Breath sounds: No wheezing or rhonchi.  Musculoskeletal:     Cervical back: Normal range of motion.     Right lower leg: No edema.     Left lower leg: No edema.  Lymphadenopathy:     Cervical: No cervical adenopathy.  Skin:    General: Skin is warm and dry.     Findings: No rash.  Neurological:     Mental Status: She is alert and oriented to person, place, and time.  Psychiatric:        Mood and Affect: Mood normal.        Behavior: Behavior normal.     Wt Readings from Last 3 Encounters:  03/17/23 166 lb 6.4 oz (75.5 kg)  12/16/22 165 lb (74.8 kg)  12/11/22 165 lb (74.8 kg)    BP 122/78   Pulse 80   Temp 97.7 F (36.5 C) (Oral)   Ht 5\' 5"  (1.651 m)   Wt 166 lb 6.4 oz (75.5 kg)   SpO2 99%   BMI 27.69 kg/m   Assessment and Plan:  Problem List Items Addressed This Visit   None Visit Diagnoses     Cough in adult    -  Primary   finish course of Augmentin for bronchitis take Tessalon as needed push fluids   Relevant Medications   benzonatate (TESSALON) 100 MG capsule       No follow-ups on file.    Reubin Milan, MD [facility]  [place]

## 2023-04-23 ENCOUNTER — Ambulatory Visit: Payer: Self-pay

## 2023-04-23 ENCOUNTER — Telehealth: Payer: Self-pay | Admitting: Internal Medicine

## 2023-04-23 ENCOUNTER — Encounter: Payer: Self-pay | Admitting: Internal Medicine

## 2023-04-23 ENCOUNTER — Ambulatory Visit (INDEPENDENT_AMBULATORY_CARE_PROVIDER_SITE_OTHER): Payer: 59 | Admitting: Internal Medicine

## 2023-04-23 VITALS — BP 122/70 | HR 79 | Temp 97.8°F | Ht 65.0 in | Wt 169.0 lb

## 2023-04-23 DIAGNOSIS — K219 Gastro-esophageal reflux disease without esophagitis: Secondary | ICD-10-CM | POA: Diagnosis not present

## 2023-04-23 MED ORDER — PANTOPRAZOLE SODIUM 40 MG PO TBEC: 40.0000 mg | DELAYED_RELEASE_TABLET | Freq: Every day | ORAL | 0 refills | Status: DC

## 2023-04-23 NOTE — Telephone Encounter (Signed)
Patient said pharmacy only would give her 30 pills even though it was written to give 60. She will call back when she runs out, and we will refill for BID.  - Madeline Moon

## 2023-04-23 NOTE — Progress Notes (Signed)
Date:  04/23/2023   Name:  Madeline Moon   DOB:  Apr 20, 1961   MRN:  [id number]   Chief Complaint: Cough (Patient was seen one month ago. Said she got better but still coughing. )  Cough This is a chronic problem. The current episode started more than 1 month ago. The problem has been unchanged. The problem occurs every few minutes. The cough is Non-productive. Associated symptoms include a sore throat (and reflux). Pertinent negatives include no chest pain, chills, ear congestion, fever, postnasal drip, shortness of breath or wheezing. The symptoms are aggravated by lying down. Treatments tried: TUMS and Pepcis. The treatment provided mild relief.    Review of Systems  Constitutional:  Positive for unexpected weight change (has not been following the carnivore diet very well). Negative for chills, fatigue and fever.  HENT:  Positive for sore throat (and reflux). Negative for postnasal drip.   Respiratory:  Positive for cough. Negative for chest tightness, shortness of breath and wheezing.   Cardiovascular:  Negative for chest pain and palpitations.  Gastrointestinal:  Negative for abdominal pain, nausea and vomiting.  Psychiatric/Behavioral:  Negative for dysphoric mood and sleep disturbance. The patient is not nervous/anxious.      Lab Results  Component Value Date   NA 133 (L) 09/24/2022   K 4.3 09/24/2022   CO2 24 09/24/2022   GLUCOSE 102 (H) 09/24/2022   BUN 22 09/24/2022   CREATININE 0.79 09/24/2022   CALCIUM 8.8 (L) 09/24/2022   EGFR 82 10/09/2020   GFRNONAA >60 09/24/2022   Lab Results  Component Value Date   CHOL 282 (H) 09/24/2022   HDL 70 09/24/2022   LDLCALC 196 (H) 09/24/2022   TRIG 80 09/24/2022   CHOLHDL 4.0 09/24/2022   Lab Results  Component Value Date   TSH 1.141 09/24/2022   Lab Results  Component Value Date   HGBA1C 5.8 (H) 09/24/2022   Lab Results  Component Value Date   WBC 3.4 (L) 09/24/2022   HGB 15.3 (H) 09/24/2022   HCT 46.0  09/24/2022   MCV 86.3 09/24/2022   PLT 222 09/24/2022   Lab Results  Component Value Date   ALT 15 09/24/2022   AST 18 09/24/2022   ALKPHOS 70 09/24/2022   BILITOT 0.6 09/24/2022   Lab Results  Component Value Date   VD25OH 37.07 09/24/2022     Patient Active Problem List   Diagnosis Date Noted   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 Patient stated she would take Morphine if she really needed it.   Oxycodone Itching    Past Surgical History:  Procedure Laterality Date   BLADDER SUSPENSION N/A 07/01/2022   Procedure: TRANSVAGINAL TAPE (TVT) PROCEDURE;  Surgeon: Marguerita Beards, MD;  Location: [facility];  Service: Gynecology;  Laterality: N/A;   COLONOSCOPY  2013   COLONOSCOPY  2011   COLONOSCOPY WITH PROPOFOL N/A 10/08/2021   Procedure: COLONOSCOPY WITH PROPOFOL;  Surgeon: Midge Minium, MD;  Location[facility];  Service: Endoscopy;  Laterality: N/A;   CYSTOSCOPY N/A 07/01/2022   Procedure: CYSTOSCOPY;  Surgeon: Marguerita Beards, MD;  Location: [facility];  Service: Gynecology;  Laterality: N/A;   PERINEOPLASTY N/A 07/01/2022   Procedure: PERINEOPLASTY;  Surgeon: Marguerita Beards, MD;  [facility];  Service: Gynecology;  Laterality: N/A;   RIGHT OOPHORECTOMY     pt not sure if her ovary was removed or just her tube   TUBAL LIGATION     XI  ROBOTIC ASSISTED TOTAL HYSTERECTOMY WITH SACROCOLPOPEXY N/A 07/01/2022   Procedure: XI ROBOTIC ASSISTED TOTAL HYSTERECTOMY WITH BILATERAL SALPINGO-OOPHORECTOMY AND SACROCOLPOPEXY;  Surgeon: Marguerita Beards, MD;  Location: [facility];  Service: Gynecology;  Laterality: N/A;  total time requested is 3.5 hours    Social History   Tobacco Use   Smoking status: Never   Smokeless tobacco: Never  Vaping Use   Vaping status: Never Used  Substance Use Topics   Alcohol use: Not Currently    Comment: occasional, about one drink once a month   Drug use: No     Medication list has been reviewed and updated.  Current Meds  Medication Sig   calcium carbonate (TUMS EX) 750 MG chewable tablet Chew 1 tablet by mouth as needed for heartburn.   cimetidine (TAGAMET) 200 MG tablet Take 200 mg by mouth 2 (two) times daily.   NON FORMULARY Sodium, potassium and magnesium solution.  15 drops in AM coffee   pantoprazole (PROTONIX) 40 MG tablet Take 1 tablet (40 mg total) by mouth daily.   triamcinolone ointment (KENALOG) 0.5 % Apply 1 Application topically 2 (two) times daily. To rash on arms   UNABLE TO FIND Med Name: Liquid Iodine   VITAMIN D PO Take by mouth as needed. D3       04/23/2023    9:46 AM 03/17/2023   10:28 AM 12/11/2022    1:55 PM 09/24/2022    8:03 AM  GAD 7 : Generalized Anxiety Score  Nervous, Anxious, on Edge 0 0 0 0  Control/stop worrying 0 0 0 1  Worry too much - different things 0 0 0 1  Trouble relaxing 0 0 0 0  Restless 0 0 0 0  Easily annoyed or irritable 0 0 0 0  Afraid - awful might happen 0 0 0 0  Total GAD 7 Score 0 0 0 2  Anxiety Difficulty Not difficult at all Not difficult at all Not difficult at all Not difficult at all       04/23/2023    9:46 AM 03/17/2023   10:28 AM 12/11/2022    1:55 PM  Depression screen PHQ 2/9  Decreased Interest 0 0 0  Down, Depressed, Hopeless 0 0 0  PHQ - 2 Score 0 0 0  Altered sleeping 2 0 0  Tired,  decreased energy 0 1 0  Change in appetite 0 0 0  Feeling bad or failure about yourself  0 0 0  Trouble concentrating 0 0 0  Moving slowly or fidgety/restless 0 0 0  Suicidal thoughts 0 0 0  PHQ-9 Score 2 1 0  Difficult doing work/chores Not difficult at all Not difficult at all Not difficult at all    BP Readings from Last 3 Encounters:  04/23/23 122/70  03/17/23 122/78  12/16/22 122/66    Physical Exam Vitals and nursing note reviewed.  Constitutional:      General: She is not in acute distress.    Appearance:  Normal appearance. She is well-developed.  HENT:     Head: Normocephalic and atraumatic.  Neck:     Vascular: No carotid bruit.  Cardiovascular:     Rate and Rhythm: Normal rate and regular rhythm.     Heart sounds: No murmur heard. Pulmonary:     Effort: Pulmonary effort is normal. No respiratory distress.     Breath sounds: No wheezing or rhonchi.  Abdominal:     General: Abdomen is flat.     Palpations: Abdomen is soft.  Musculoskeletal:     Cervical back: Normal range of motion.  Lymphadenopathy:     Cervical: No cervical adenopathy.  Skin:    General: Skin is warm and dry.     Findings: No rash.  Neurological:     Mental Status: She is alert and oriented to person, place, and time.  Psychiatric:        Mood and Affect: Mood normal.        Behavior: Behavior normal.     Wt Readings from Last 3 Encounters:  04/23/23 169 lb (76.7 kg)  03/17/23 166 lb 6.4 oz (75.5 kg)  12/16/22 165 lb (74.8 kg)    BP 122/70   Pulse 79   Temp 97.8 F (36.6 C) (Oral)   Ht 5\' 5"  (1.651 m)   Wt 169 lb (76.7 kg)   SpO2 99%   BMI 28.12 kg/m   Assessment and Plan:  Problem List Items Addressed This Visit       Unprioritized   Gastroesophageal reflux disease without esophagitis - Primary (Chronic)    Reflux symptoms are active on only on TUMS.  She is also having some dysphagia with steak, large pills.  No choking or vomiting. No red flag signs such as weight  loss, n/v, melena. Will start Protonix bid and re-evaluate in one month.       Relevant Medications   cimetidine (TAGAMET) 200 MG tablet   pantoprazole (PROTONIX) 40 MG tablet    No follow-ups on file.    Reubin Milan, MD [facility] [place]

## 2023-04-23 NOTE — Assessment & Plan Note (Signed)
Reflux symptoms are active on only on TUMS.  She is also having some dysphagia with steak, large pills.  No choking or vomiting. No red flag signs such as weight loss, n/v, melena. Will start Protonix bid and re-evaluate in one month.

## 2023-04-23 NOTE — Telephone Encounter (Signed)
Message from [place] T sent at 04/23/2023 10:50 AM EST  Summary: medication question   Patient called stated provider told her to take one tablet in the morning and one at night for 30 days for pantoprazole (PROTONIX) 40 MG tablet but the script is only written for 1 tablet daily. Please advise patient.         Chief Complaint: pt prescribed #60 pills, pt's insurance will only cover 30 pills because of how the order was written. SIG will have to be changed and ordered 1 tablet BID and pt can get rest of meds. As written pt only has 14 days worth.  Symptoms: n/a Frequency: n/a Pertinent Negatives: Patient denies n/a Disposition: [] ED /[] Urgent Care (no appt availability in office) / [] Appointment(In office/virtual)/ []  [place] [facility]/ [] Home Care/ [] Refused Recommended Disposition /[] [place] [facility]/ [x]  Follow-up with PCP Additional Notes: Advised pt that  will call her pharmacy to check on what occurred. Needs rx reordered per Joe Pharmacist   Reason for Disposition  Caller with prescription and triager answers question  Answer Assessment - Initial Assessment Questions 1. DRUG NAME: "What medicine do you need to have refilled?"     pantoprazole 2. REFILLS REMAINING: "How many refills are remaining?" (Note: The label on the medicine or pill bottle will show how many refills are remaining. If there are no refills remaining, then a renewal may be needed.)     1 but pt can get the refill in month  3. EXPIRATION DATE: "What is the expiration date?" (Note: The label states when the prescription will expire, and thus can no longer be refilled.)     N/a 4. PRESCRIBING HCP: "Who prescribed it?" Reason: If prescribed by specialist, call should be referred to that group.     PCP 5. SYMPTOMS: "Do you have any symptoms?"     N/a 6. PREGNANCY: "Is there any chance that you are pregnant?" "When was your last menstrual period?"     N/a  Protocols used: Medication Refill and Renewal  Call-A-AH

## 2023-05-19 ENCOUNTER — Other Ambulatory Visit: Payer: Self-pay

## 2023-05-19 ENCOUNTER — Telehealth: Payer: Self-pay | Admitting: Internal Medicine

## 2023-05-19 DIAGNOSIS — K219 Gastro-esophageal reflux disease without esophagitis: Secondary | ICD-10-CM

## 2023-05-19 MED ORDER — PANTOPRAZOLE SODIUM 40 MG PO TBEC: 40.0000 mg | DELAYED_RELEASE_TABLET | Freq: Two times a day (BID) | ORAL | 1 refills | Status: DC

## 2023-05-19 NOTE — Telephone Encounter (Signed)
Copied from CRM [id number]. Topic: General - Other >> May 19, 2023 12:09 PM Franchot Heidelberg wrote: Reason for CRM: Pt called requesting to speak to chassidy regarding medication concerns. Please advise

## 2023-05-19 NOTE — Telephone Encounter (Signed)
Called pt changed her pantoprazole to twice a day. Pt stated her medication was sent in wrong.  KP

## 2023-05-26 ENCOUNTER — Ambulatory Visit (INDEPENDENT_AMBULATORY_CARE_PROVIDER_SITE_OTHER): Payer: 59 | Admitting: Internal Medicine

## 2023-05-26 ENCOUNTER — Encounter: Payer: Self-pay | Admitting: Internal Medicine

## 2023-05-26 VITALS — BP 128/76 | HR 76 | Ht 65.0 in | Wt 176.0 lb

## 2023-05-26 DIAGNOSIS — K219 Gastro-esophageal reflux disease without esophagitis: Secondary | ICD-10-CM | POA: Diagnosis not present

## 2023-05-26 NOTE — Assessment & Plan Note (Addendum)
Seen last month with dysphagia to large pills and steak. Protonix bid started with excellent response. Can reduce dose to once a day - and if tolerated, can reduce to three times per week

## 2023-05-26 NOTE — Progress Notes (Signed)
Date:  05/26/2023   Name:  Madeline Moon   DOB:  March 28, 1961   MRN:  [id number]   Chief Complaint: Gastroesophageal Reflux  Gastroesophageal Reflux She complains of dysphagia. She reports no chest pain, no coughing, no heartburn, no sore throat or no water brash. The problem has been rapidly improving. The symptoms are aggravated by certain foods. Pertinent negatives include no fatigue, melena, muscle weakness or weight loss. She has tried a PPI for the symptoms. The treatment provided significant relief.    Review of Systems  Constitutional:  Negative for chills, fatigue, unexpected weight change and weight loss.  HENT:  Negative for sore throat.   Respiratory:  Negative for cough and chest tightness.   Cardiovascular:  Negative for chest pain and palpitations.  Gastrointestinal:  Positive for dysphagia. Negative for heartburn and melena.  Musculoskeletal:  Negative for muscle weakness.  Neurological:  Negative for dizziness and headaches.     Lab Results  Component Value Date   NA 133 (L) 09/24/2022   K 4.3 09/24/2022   CO2 24 09/24/2022   GLUCOSE 102 (H) 09/24/2022   BUN 22 09/24/2022   CREATININE 0.79 09/24/2022   CALCIUM 8.8 (L) 09/24/2022   EGFR 82 10/09/2020   GFRNONAA >60 09/24/2022   Lab Results  Component Value Date   CHOL 282 (H) 09/24/2022   HDL 70 09/24/2022   LDLCALC 196 (H) 09/24/2022   TRIG 80 09/24/2022   CHOLHDL 4.0 09/24/2022   Lab Results  Component Value Date   TSH 1.141 09/24/2022   Lab Results  Component Value Date   HGBA1C 5.8 (H) 09/24/2022   Lab Results  Component Value Date   WBC 3.4 (L) 09/24/2022   HGB 15.3 (H) 09/24/2022   HCT 46.0 09/24/2022   MCV 86.3 09/24/2022   PLT 222 09/24/2022   Lab Results  Component Value Date   ALT 15 09/24/2022   AST 18 09/24/2022   ALKPHOS 70 09/24/2022   BILITOT 0.6 09/24/2022   Lab Results  Component Value Date   VD25OH 37.07 09/24/2022     Patient Active Problem List    Diagnosis Date Noted   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 Patient stated she would take Morphine if she really needed it.   Oxycodone Itching    Past Surgical History:  Procedure Laterality Date   BLADDER SUSPENSION N/A 07/01/2022   Procedure: TRANSVAGINAL TAPE (TVT) PROCEDURE;  Surgeon: Marguerita Beards, MD;  Location: [facility];  Service: Gynecology;  Laterality: N/A;   COLONOSCOPY  2013   COLONOSCOPY  2011   COLONOSCOPY WITH PROPOFOL N/A 10/08/2021   Procedure: COLONOSCOPY WITH PROPOFOL;  Surgeon: Midge Minium, MD;  Location: [facility];  Service: Endoscopy;  Laterality: N/A;   CYSTOSCOPY N/A 07/01/2022   Procedure: CYSTOSCOPY;  Surgeon: Marguerita Beards, MD;  Location: [facility];  Service: Gynecology;  Laterality: N/A;   PERINEOPLASTY N/A 07/01/2022   Procedure: PERINEOPLASTY;  Surgeon: Marguerita Beards, MD;  Location: [facility]  ;  Service: Gynecology;  Laterality: N/A;   RIGHT OOPHORECTOMY     pt not sure if her ovary was removed or just her tube   TUBAL LIGATION     XI ROBOTIC ASSISTED TOTAL HYSTERECTOMY WITH SACROCOLPOPEXY N/A 07/01/2022   Procedure: XI ROBOTIC ASSISTED TOTAL HYSTERECTOMY WITH BILATERAL SALPINGO-OOPHORECTOMY AND SACROCOLPOPEXY;  Surgeon: Marguerita Beards, MD;  Location: [facility];  Service: Gynecology;  Laterality: N/A;   total time requested is 3.5 hours    Social History   Tobacco Use   Smoking status: Never   Smokeless tobacco: Never  Vaping Use   Vaping status: Never Used  Substance Use Topics   Alcohol use: Not Currently    Comment: occasional, about one drink once a month   Drug use: No     Medication list has been reviewed and updated.  Current Meds  Medication Sig   calcium carbonate (TUMS EX) 750 MG chewable tablet Chew 1 tablet by mouth as needed for heartburn.   cimetidine (TAGAMET) 200 MG tablet Take 200 mg by mouth 2 (two) times daily.   NON FORMULARY Sodium, potassium and magnesium solution.  15 drops in AM coffee   pantoprazole (PROTONIX) 40 MG tablet Take 1 tablet (40 mg total) by mouth 2 (two) times daily.   triamcinolone ointment (KENALOG) 0.5 % Apply 1 Application topically 2 (two) times daily. To rash on arms   UNABLE TO FIND Med Name: Liquid Iodine   VITAMIN D PO Take by mouth as needed. D3       05/26/2023    1:27 PM 04/23/2023    9:46 AM 03/17/2023   10:28 AM 12/11/2022    1:55 PM  GAD 7 : Generalized Anxiety Score  Nervous, Anxious, on Edge 0 0 0 0  Control/stop worrying 0 0 0 0  Worry too much - different things 0 0 0 0  Trouble relaxing 0 0 0 0  Restless 0 0 0 0  Easily annoyed or irritable 0 0 0 0  Afraid - awful might happen 0 0 0 0  Total GAD 7 Score 0 0 0 0  Anxiety Difficulty Not difficult at all Not difficult at all Not difficult at all Not difficult at all       05/26/2023    1:27 PM 04/23/2023    9:46 AM 03/17/2023   10:28 AM  Depression screen PHQ 2/9  Decreased Interest 0 0 0  Down, Depressed, Hopeless 0 0 0  PHQ - 2 Score 0 0 0  Altered sleeping 0 2 0  Tired, decreased energy 0 0 1  Change in appetite 0 0 0  Feeling bad or failure about yourself  0 0 0  Trouble concentrating 0 0 0  Moving slowly or fidgety/restless 0 0 0  Suicidal thoughts 0 0 0  PHQ-9 Score 0 2 1  Difficult doing work/chores Not difficult at all Not difficult at all  Not difficult at all    BP Readings from Last 3 Encounters:  05/26/23 128/76  04/23/23 122/70  03/17/23 122/78    Physical Exam Constitutional:      Appearance: Normal appearance.  Cardiovascular:     Rate and Rhythm: Normal rate and regular rhythm.     Heart sounds: No murmur heard. Pulmonary:     Effort: Pulmonary effort is normal.     Breath sounds: No wheezing or rhonchi.  Musculoskeletal:     Cervical back: Normal range of motion.  Right lower leg: No edema.     Left lower leg: No edema.  Skin:    Capillary Refill: Capillary refill takes less than 2 seconds.  Neurological:     Mental Status: She is alert.  Psychiatric:        Mood and Affect: Mood normal.     Wt Readings from Last 3 Encounters:  05/26/23 176 lb (79.8 kg)  04/23/23 169 lb (76.7 kg)  03/17/23 166 lb 6.4 oz (75.5 kg)    BP 128/76   Pulse 76   Ht 5\' 5"  (1.651 m)   Wt 176 lb (79.8 kg)   SpO2 98%   BMI 29.29 kg/m   Assessment and Plan:  Problem List Items Addressed This Visit       Unprioritized   Gastroesophageal reflux disease without esophagitis - Primary (Chronic)   Seen last month with dysphagia to large pills and steak. Protonix bid started with excellent response. Can reduce dose to once a day - and if tolerated, can reduce to three times per week        No follow-ups on file.    Reubin Milan, MD [facility] [place]

## 2023-07-13 ENCOUNTER — Other Ambulatory Visit: Payer: Self-pay | Admitting: Internal Medicine

## 2023-07-13 DIAGNOSIS — K219 Gastro-esophageal reflux disease without esophagitis: Secondary | ICD-10-CM

## 2023-07-14 NOTE — Telephone Encounter (Signed)
 Requested Prescriptions  Pending Prescriptions Disp Refills   pantoprazole  (PROTONIX ) 40 MG tablet [Pharmacy Med Name: PANTOPRAZOLE  SOD DR 40 MG TAB] 60 tablet 1    Sig: TAKE 1 TABLET BY MOUTH TWICE A DAY     Gastroenterology: Proton Pump Inhibitors Passed - 07/14/2023  3:22 PM      Passed - Valid encounter within last 12 months    Recent Outpatient Visits           1 month ago Gastroesophageal reflux disease without esophagitis   [place] [facility] at [facility], Chales [place], MD   2 months ago Gastroesophageal reflux disease without esophagitis   [place] [facility] at [facility], Chales [place], MD   3 months ago Cough in adult   [facility] at [facility], Chales [place], MD   7 months ago Drug eruption   [facility] at [facility], Chales [place], MD   7 months ago Impetigo   [facility] at [facility], Chales [place], MD       Future Appointments             In 2 months Gala Jubilee, Chales [place], MD [facility] at [facility]

## 2023-09-15 ENCOUNTER — Other Ambulatory Visit: Payer: Self-pay | Admitting: Internal Medicine

## 2023-09-15 DIAGNOSIS — K219 Gastro-esophageal reflux disease without esophagitis: Secondary | ICD-10-CM

## 2023-09-16 NOTE — Telephone Encounter (Signed)
 Requested Prescriptions  Pending Prescriptions Disp Refills   pantoprazole (PROTONIX) 40 MG tablet [Pharmacy Med Name: PANTOPRAZOLE SOD DR 40 MG TAB] 180 tablet 0    Sig: TAKE 1 TABLET BY MOUTH TWICE A DAY     Gastroenterology: Proton Pump Inhibitors Failed - 09/16/2023 12:52 PM      Failed - Valid encounter within last 12 months    Recent Outpatient Visits   None     Future Appointments             In 1 week Gala Jubilee, [place], MD [facility] at [facility], [facility]

## 2023-09-26 ENCOUNTER — Encounter: Payer: Self-pay | Admitting: Internal Medicine

## 2023-09-26 ENCOUNTER — Ambulatory Visit (INDEPENDENT_AMBULATORY_CARE_PROVIDER_SITE_OTHER): Payer: Self-pay | Admitting: Internal Medicine

## 2023-09-26 VITALS — BP 126/82 | HR 75 | Ht 65.0 in | Wt 182.1 lb

## 2023-09-26 DIAGNOSIS — Z0000 Encounter for general adult medical examination without abnormal findings: Secondary | ICD-10-CM | POA: Diagnosis not present

## 2023-09-26 DIAGNOSIS — E785 Hyperlipidemia, unspecified: Secondary | ICD-10-CM | POA: Diagnosis not present

## 2023-09-26 DIAGNOSIS — K219 Gastro-esophageal reflux disease without esophagitis: Secondary | ICD-10-CM | POA: Diagnosis not present

## 2023-09-26 DIAGNOSIS — H6992 Unspecified Eustachian tube disorder, left ear: Secondary | ICD-10-CM

## 2023-09-26 DIAGNOSIS — R7303 Prediabetes: Secondary | ICD-10-CM

## 2023-09-26 DIAGNOSIS — Z1231 Encounter for screening mammogram for malignant neoplasm of breast: Secondary | ICD-10-CM

## 2023-09-26 DIAGNOSIS — R0683 Snoring: Secondary | ICD-10-CM

## 2023-09-26 NOTE — Assessment & Plan Note (Signed)
 Managed with diet and exercise. Lab Results  Component Value Date   LDLCALC 196 (H) 09/24/2022

## 2023-09-26 NOTE — Patient Instructions (Signed)
 Call [facility] to schedule your mammogram at [phone].

## 2023-09-26 NOTE — Assessment & Plan Note (Signed)
 She has gained about 15 lbs recently. She plans to get back to her carnivore diet. Lab Results  Component Value Date   HGBA1C 5.8 (H) 09/24/2022

## 2023-09-26 NOTE — Progress Notes (Signed)
 Date:  09/26/2023   Name:  Madeline Moon   DOB:  1961-05-11   MRN:  [id number]   Chief Complaint: Annual Exam and Gastroesophageal Reflux Madeline Moon is a 63 y.o. female who presents today for her Complete Annual Exam. She feels well. She reports exercising none. She reports she is sleeping well. Breast complaints none.  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  Health Maintenance  Topic Date Due   COVID-19 Vaccine (1 - 2024-25 season) 10/12/2023*   Mammogram  11/26/2023   Flu Shot  01/02/2024   DTaP/Tdap/Td vaccine (2 - Td or Tdap) 08/09/2025   Colon Cancer Screening  10/09/2031   Zoster (Shingles) Vaccine  Completed   Hepatitis C Screening  Addressed   HIV Screening  Addressed   HPV Vaccine  Aged Out   Meningitis B Vaccine  Aged Out  *Topic was postponed. The date shown is not the original due date.    Gastroesophageal Reflux She complains of heartburn. She reports no abdominal pain, no chest pain, no coughing, no sore throat or no wheezing. This is a recurrent problem. The problem occurs occasionally. Pertinent negatives include no fatigue. She has tried a PPI and a histamine-2 antagonist for the symptoms.  Hyperlipidemia This is a chronic problem. Recent lipid tests were reviewed and are high. Pertinent negatives include no chest pain, myalgias or shortness of breath. Current antihyperlipidemic treatment includes exercise and diet change.    Review of Systems  Constitutional:  Negative for fatigue and unexpected weight change.  HENT:  Positive for congestion and ear pain (and recurrent congestion and fullness). Negative for sore throat and trouble swallowing.        Snoring  Eyes:  Negative for visual disturbance.  Respiratory:  Negative for cough, chest tightness, shortness of breath and wheezing.   Cardiovascular:  Negative for chest pain, palpitations and leg swelling.  Gastrointestinal:  Positive for heartburn.  Negative for abdominal pain, constipation and diarrhea.  Genitourinary:  Negative for dysuria and urgency.  Musculoskeletal:  Negative for arthralgias and myalgias.  Neurological:  Positive for dizziness (intermittent vertigo self treated with Epley maneuver). Negative for weakness, light-headedness and headaches.  Psychiatric/Behavioral:  Positive for sleep disturbance (snoring). Negative for dysphoric mood. The patient is not nervous/anxious.      Lab Results  Component Value Date   NA 133 (L) 09/24/2022   K 4.3 09/24/2022   CO2 24 09/24/2022   GLUCOSE 102 (H) 09/24/2022   BUN 22 09/24/2022   CREATININE 0.79 09/24/2022   CALCIUM 8.8 (L) 09/24/2022   EGFR 82 10/09/2020   GFRNONAA >60 09/24/2022   Lab Results  Component Value Date   CHOL 282 (H) 09/24/2022   HDL 70 09/24/2022   LDLCALC 196 (H) 09/24/2022   TRIG 80 09/24/2022   CHOLHDL 4.0 09/24/2022   Lab Results  Component Value Date   TSH 1.141 09/24/2022   Lab Results  Component Value Date   HGBA1C 5.8 (H) 09/24/2022   Lab Results  Component Value Date   WBC 3.4 (L) 09/24/2022   HGB 15.3 (H) 09/24/2022   HCT 46.0 09/24/2022   MCV 86.3 09/24/2022   PLT 222 09/24/2022   Lab Results  Component Value Date   ALT 15 09/24/2022   AST 18 09/24/2022   ALKPHOS 70 09/24/2022   BILITOT 0.6 09/24/2022   Lab Results  Component Value Date   VD25OH 37.07 09/24/2022  Patient Active Problem List   Diagnosis Date Noted   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 Patient stated she would take Morphine if she really needed it.   Oxycodone  Itching    Past Surgical History:  Procedure Laterality Date   BLADDER SUSPENSION N/A 07/01/2022   Procedure: TRANSVAGINAL TAPE (TVT) PROCEDURE;  Surgeon: Arma Lamp, MD;  Location: [facility];  Service: Gynecology;  Laterality: N/A;   COLONOSCOPY  2013   COLONOSCOPY  2011   COLONOSCOPY WITH PROPOFOL  N/A 10/08/2021   Procedure: COLONOSCOPY WITH PROPOFOL ;  Surgeon: Marnee Sink, MD;  Location: [facility];  Service: Endoscopy;  Laterality: N/A;   CYSTOSCOPY N/A 07/01/2022   Procedure: CYSTOSCOPY;  Surgeon: Arma Lamp, MD;  Location: [facility];  Service: Gynecology;  Laterality: N/A;   PERINEOPLASTY N/A 07/01/2022   Procedure: PERINEOPLASTY;  Surgeon: Arma Lamp, MD;  Location: [facility];  Service: Gynecology;  Laterality: N/A;   RIGHT OOPHORECTOMY     pt not sure if her ovary was removed or just her tube   TUBAL LIGATION     XI ROBOTIC ASSISTED TOTAL HYSTERECTOMY WITH SACROCOLPOPEXY N/A 07/01/2022   Procedure: XI ROBOTIC ASSISTED TOTAL HYSTERECTOMY WITH BILATERAL SALPINGO-OOPHORECTOMY AND SACROCOLPOPEXY;  Surgeon: Arma Lamp, MD;  Location: [facility];  Service: Gynecology;  Laterality: N/A;  total time requested is 3.5 hours    Social History   Tobacco Use   Smoking status: Never   Smokeless tobacco: Never  Vaping Use   Vaping status: Never Used  Substance Use Topics   Alcohol use: Not Currently    Comment: occasional, about one drink once a month   Drug use: No     Medication list has been reviewed and updated.  Current Meds  Medication Sig   calcium carbonate (TUMS EX) 750 MG chewable tablet Chew 1 tablet by mouth as needed for heartburn.   NON FORMULARY Sodium, potassium and magnesium  solution.  15 drops in AM coffee   pantoprazole  (PROTONIX ) 40 MG tablet TAKE 1 TABLET BY MOUTH TWICE A DAY   UNABLE TO FIND Med Name: Liquid Iodine    VITAMIN D  PO Take by mouth as needed. D3   VITAMIN D -VITAMIN K PO Take by mouth daily.       05/26/2023    1:27 PM 04/23/2023    9:46 AM 03/17/2023   10:28 AM 12/11/2022    1:55 PM  GAD 7 : Generalized Anxiety Score  Nervous, Anxious, on Edge 0 0 0 0  Control/stop worrying 0 0 0 0  Worry too much - different things 0 0 0 0  Trouble relaxing 0 0 0 0  Restless 0 0 0 0  Easily annoyed or irritable 0 0 0 0  Afraid - awful might happen 0 0 0 0  Total GAD 7 Score 0 0 0 0  Anxiety Difficulty Not difficult at all Not difficult at all Not difficult at all Not difficult at all  05/26/2023    1:27 PM 04/23/2023    9:46 AM 03/17/2023   10:28 AM  Depression screen PHQ 2/9  Decreased Interest 0 0 0  Down, Depressed, Hopeless 0 0 0  PHQ - 2 Score 0 0 0  Altered sleeping 0 2 0  Tired, decreased energy 0 0 1  Change in appetite 0 0 0  Feeling bad or failure about yourself  0 0 0  Trouble concentrating 0 0 0  Moving slowly or fidgety/restless 0 0 0  Suicidal thoughts 0 0 0  PHQ-9 Score 0 2 1  Difficult doing work/chores Not difficult at all Not difficult at all Not difficult at all    BP Readings from Last 3 Encounters:  09/26/23 126/82  05/26/23 128/76  04/23/23 122/70    Physical Exam Vitals and nursing note reviewed.  Constitutional:      General: She is not in acute distress.    Appearance: Normal appearance. She is well-developed.  HENT:     Head: Normocephalic and atraumatic.     Right Ear: Tympanic membrane and ear canal normal. Tympanic membrane is not erythematous or retracted.     Left Ear: Tympanic membrane and ear canal normal. Tympanic membrane is not erythematous or retracted.     Nose:     Right Sinus: No maxillary sinus tenderness.     Left Sinus: No maxillary sinus tenderness.     Mouth/Throat:      Mouth: Mucous membranes are moist. No oral lesions.     Pharynx: No oropharyngeal exudate or posterior oropharyngeal erythema.  Eyes:     General: No scleral icterus.       Right eye: No discharge.        Left eye: No discharge.     Conjunctiva/sclera: Conjunctivae normal.  Neck:     Thyroid : No thyromegaly.     Vascular: No carotid bruit.  Cardiovascular:     Rate and Rhythm: Normal rate and regular rhythm.     Pulses: Normal pulses.     Heart sounds: Normal heart sounds.  Pulmonary:     Effort: Pulmonary effort is normal. No respiratory distress.     Breath sounds: No wheezing.  Abdominal:     General: Bowel sounds are normal.     Palpations: Abdomen is soft.     Tenderness: There is no abdominal tenderness.  Musculoskeletal:     Cervical back: Normal range of motion. No erythema.     Right lower leg: No edema.     Left lower leg: No edema.  Lymphadenopathy:     Cervical: No cervical adenopathy.  Skin:    General: Skin is warm and dry.     Findings: No rash.  Neurological:     Mental Status: She is alert and oriented to person, place, and time.     Cranial Nerves: No cranial nerve deficit.     Sensory: No sensory deficit.     Deep Tendon Reflexes: Reflexes are normal and symmetric.  Psychiatric:        Attention and Perception: Attention and perception normal.        Mood and Affect: Mood normal.     Wt Readings from Last 3 Encounters:  09/26/23 182 lb 2 oz (82.6 kg)  05/26/23 176 lb (79.8 kg)  04/23/23 169 lb (76.7 kg)    BP 126/82   Pulse 75   Ht 5\' 5"  (1.651 m)   Wt 182 lb 2 oz (82.6 kg)   SpO2  96%   BMI 30.31 kg/m   Assessment and Plan:  Problem List Items Addressed This Visit       Unprioritized   Hyperlipidemia, mild (Chronic)   Managed with diet and exercise. Lab Results  Component Value Date   LDLCALC 196 (H) 09/24/2022         Relevant Orders   Lipid panel   TSH   Gastroesophageal reflux disease without esophagitis (Chronic)    Reflux symptoms are controlled on Pepcid and diet changes. Patient denies red flag symptoms - no melena, weight loss, dysphagia.       Relevant Orders   CBC with Differential/Platelet   Prediabetes (Chronic)   She has gained about 15 lbs recently. She plans to get back to her carnivore diet. Lab Results  Component Value Date   HGBA1C 5.8 (H) 09/24/2022         Relevant Orders   Comprehensive metabolic panel with GFR   Hemoglobin A1c   Other Visit Diagnoses       Annual physical exam    -  Primary   up to date on immunizations.   Relevant Orders   Urinalysis, Routine w reflex microscopic     Encounter for screening mammogram for breast cancer       schedule in July   Relevant Orders   MM 3D SCREENING MAMMOGRAM BILATERAL BREAST     Snoring       no evidence of sleep apnea refer to ENT for evaluation   Relevant Orders   Ambulatory referral to ENT     Disorder of left eustachian tube       Relevant Orders   Ambulatory referral to ENT       No follow-ups on file.    Sheron Dixons, MD [facility] [place]

## 2023-09-26 NOTE — Assessment & Plan Note (Signed)
 Reflux symptoms are controlled on Pepcid and diet changes. Patient denies red flag symptoms - no melena, weight loss, dysphagia.

## 2023-10-01 ENCOUNTER — Other Ambulatory Visit: Payer: Self-pay | Admitting: Internal Medicine

## 2023-10-01 ENCOUNTER — Encounter: Payer: Self-pay | Admitting: Internal Medicine

## 2023-10-01 LAB — LIPID PANEL
Chol/HDL Ratio: 3.2 ratio (ref 0.0–4.4)
Cholesterol, Total: 222 mg/dL — ABNORMAL HIGH (ref 100–199)
HDL: 70 mg/dL (ref >39)
LDL Chol Calc (NIH): 139 mg/dL — ABNORMAL HIGH (ref 0–99)
Triglycerides: 75 mg/dL (ref 0–149)
VLDL Cholesterol Cal: 13 mg/dL (ref 5–40)

## 2023-10-01 LAB — COMPREHENSIVE METABOLIC PANEL WITH GFR
ALT: 11 IU/L (ref 0–32)
AST: 17 IU/L (ref 0–40)
Albumin: 4 g/dL (ref 3.9–4.9)
Alkaline Phosphatase: 88 IU/L (ref 44–121)
BUN/Creatinine Ratio: 16 (ref 12–28)
BUN: 14 mg/dL (ref 8–27)
Bilirubin Total: 0.4 mg/dL (ref 0.0–1.2)
CO2: 23 mmol/L (ref 20–29)
Calcium: 9.2 mg/dL (ref 8.7–10.3)
Chloride: 105 mmol/L (ref 96–106)
Creatinine, Ser: 0.89 mg/dL (ref 0.57–1.00)
Globulin, Total: 2.3 g/dL (ref 1.5–4.5)
Glucose: 99 mg/dL (ref 70–99)
Potassium: 4.5 mmol/L (ref 3.5–5.2)
Sodium: 140 mmol/L (ref 134–144)
Total Protein: 6.3 g/dL (ref 6.0–8.5)
eGFR: 73 mL/min/{1.73_m2} (ref >59)

## 2023-10-01 LAB — CBC WITH DIFFERENTIAL/PLATELET
Basophils Absolute: 0 10*3/uL (ref 0.0–0.2)
Basos: 1 %
EOS (ABSOLUTE): 0.1 10*3/uL (ref 0.0–0.4)
Eos: 3 %
Hematocrit: 40.9 % (ref 34.0–46.6)
Hemoglobin: 12.6 g/dL (ref 11.1–15.9)
Immature Grans (Abs): 0 10*3/uL (ref 0.0–0.1)
Immature Granulocytes: 0 %
Lymphocytes Absolute: 1.2 10*3/uL (ref 0.7–3.1)
Lymphs: 36 %
MCH: 25.1 pg — ABNORMAL LOW (ref 26.6–33.0)
MCHC: 30.8 g/dL — ABNORMAL LOW (ref 31.5–35.7)
MCV: 82 fL (ref 79–97)
Monocytes Absolute: 0.4 10*3/uL (ref 0.1–0.9)
Monocytes: 11 %
Neutrophils Absolute: 1.7 10*3/uL (ref 1.4–7.0)
Neutrophils: 49 %
Platelets: 254 10*3/uL (ref 150–450)
RBC: 5.02 x10E6/uL (ref 3.77–5.28)
RDW: 15.7 % — ABNORMAL HIGH (ref 11.7–15.4)
WBC: 3.4 10*3/uL (ref 3.4–10.8)

## 2023-10-01 LAB — URINALYSIS, ROUTINE W REFLEX MICROSCOPIC
Bilirubin, UA: NEGATIVE
Glucose, UA: NEGATIVE
Ketones, UA: NEGATIVE
Leukocytes,UA: NEGATIVE
Nitrite, UA: NEGATIVE
Protein,UA: NEGATIVE
RBC, UA: NEGATIVE
Specific Gravity, UA: 1.019 (ref 1.005–1.030)
Urobilinogen, Ur: 0.2 mg/dL (ref 0.2–1.0)
pH, UA: 6 (ref 5.0–7.5)

## 2023-10-01 LAB — HEMOGLOBIN A1C
Est. average glucose Bld gHb Est-mCnc: 128 mg/dL
Hgb A1c MFr Bld: 6.1 % — ABNORMAL HIGH (ref 4.8–5.6)

## 2023-10-01 LAB — TSH: TSH: 0.944 u[IU]/mL (ref 0.450–4.500)

## 2023-11-21 ENCOUNTER — Ambulatory Visit: Admitting: Internal Medicine

## 2023-11-21 ENCOUNTER — Encounter: Payer: Self-pay | Admitting: Internal Medicine

## 2023-11-21 VITALS — BP 124/78 | HR 67 | Ht 65.0 in | Wt 174.0 lb

## 2023-11-21 DIAGNOSIS — K219 Gastro-esophageal reflux disease without esophagitis: Secondary | ICD-10-CM

## 2023-11-21 DIAGNOSIS — R1319 Other dysphagia: Secondary | ICD-10-CM | POA: Diagnosis not present

## 2023-11-21 NOTE — Assessment & Plan Note (Addendum)
 Reflux symptoms improved but having dysphagia to any foods.   No regurgitation. Continue Pantoprazole  daily Will refer to GI

## 2023-11-21 NOTE — Progress Notes (Signed)
 Date:  11/21/2023   Name:  Madeline Moon   DOB:  1960/10/09   MRN:  [id number]   Chief Complaint: Gastroesophageal Reflux  Gastroesophageal Reflux She complains of dysphagia and heartburn. She reports no abdominal pain, no chest pain, no coughing or no wheezing. This is a chronic problem. The problem has been gradually worsening. Pertinent negatives include no fatigue.    Review of Systems  Constitutional:  Negative for fatigue and unexpected weight change.  HENT:  Negative for trouble swallowing.   Eyes:  Negative for visual disturbance.  Respiratory:  Negative for cough, chest tightness, shortness of breath and wheezing.   Cardiovascular:  Negative for chest pain, palpitations and leg swelling.  Gastrointestinal:  Positive for dysphagia and heartburn. Negative for abdominal pain, constipation and diarrhea.  Musculoskeletal:  Negative for arthralgias and myalgias.  Neurological:  Negative for dizziness, weakness, light-headedness and headaches.     Lab Results  Component Value Date   NA 140 09/30/2023   K 4.5 09/30/2023   CO2 23 09/30/2023   GLUCOSE 99 09/30/2023   BUN 14 09/30/2023   CREATININE 0.89 09/30/2023   CALCIUM 9.2 09/30/2023   EGFR 73 09/30/2023   GFRNONAA >60 09/24/2022   Lab Results  Component Value Date   CHOL 222 (H) 09/30/2023   HDL 70 09/30/2023   LDLCALC 139 (H) 09/30/2023   TRIG 75 09/30/2023   CHOLHDL 3.2 09/30/2023   Lab Results  Component Value Date   TSH 0.944 09/30/2023   Lab Results  Component Value Date   HGBA1C 6.1 (H) 09/30/2023   Lab Results  Component Value Date   WBC 3.4 09/30/2023   HGB 12.6 09/30/2023   HCT 40.9 09/30/2023   MCV 82 09/30/2023   PLT 254 09/30/2023   Lab Results  Component Value Date   ALT 11 09/30/2023   AST 17 09/30/2023   ALKPHOS 88 09/30/2023   BILITOT 0.4 09/30/2023   Lab Results  Component Value Date   VD25OH 37.07 09/24/2022     Patient Active Problem List   Diagnosis Date Noted    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) 09/24/2022   Uterovaginal prolapse, incomplete 07/01/2022   Prediabetes 10/08/2020   Chronic right shoulder pain 02/28/2020   Elevated blood pressure reading 10/07/2019   Gastroesophageal reflux disease without esophagitis 10/06/2018   Palmar fascial fibromatosis 10/06/2018   Hyperlipidemia, mild 08/12/2016   Mixed urge and stress incontinence 11/09/2014   Osteoarthritis 11/09/2014    Allergies  Allergen Reactions   Augmentin  [Amoxicillin -Pot Clavulanate] Rash   Morphine Itching    Nose itching and burning. Patient stated she would take Morphine if she really needed it.   Oxycodone  Itching    Past Surgical History:  Procedure Laterality Date   BLADDER SUSPENSION N/A 07/01/2022   Procedure: TRANSVAGINAL TAPE (TVT) PROCEDURE;  Surgeon: Arma Lamp, MD;  Location: [facility];  Service: Gynecology;  Laterality: N/A;   COLONOSCOPY  2013   COLONOSCOPY  2011   COLONOSCOPY WITH PROPOFOL  N/A 10/08/2021   Procedure: COLONOSCOPY WITH PROPOFOL ;  Surgeon: Marnee Sink, MD;  Location: [facility];  Service: Endoscopy;  Laterality: N/A;   CYSTOSCOPY N/A 07/01/2022   Procedure: CYSTOSCOPY;  Surgeon: Arma Lamp, MD;  Location: [facility];  Service: Gynecology;  Laterality: N/A;   PERINEOPLASTY N/A 07/01/2022   Procedure: PERINEOPLASTY;  Surgeon: Arma Lamp, MD;  Location: [facility];  Service: Gynecology;  Laterality: N/A;  RIGHT OOPHORECTOMY     pt not sure if her ovary was removed or just her tube   TUBAL LIGATION     XI ROBOTIC ASSISTED TOTAL HYSTERECTOMY WITH SACROCOLPOPEXY N/A 07/01/2022   Procedure: XI ROBOTIC ASSISTED TOTAL HYSTERECTOMY WITH BILATERAL SALPINGO-OOPHORECTOMY AND SACROCOLPOPEXY;  Surgeon: Arma Lamp, MD;  Location: [facility];  Service: Gynecology;  Laterality: N/A;  total time requested  is 3.5 hours    Social History   Tobacco Use   Smoking status: Never   Smokeless tobacco: Never  Vaping Use   Vaping status: Never Used  Substance Use Topics   Alcohol use: Not Currently    Comment: occasional, about one drink once a month   Drug use: No     Medication list has been reviewed and updated.  Current Meds  Medication Sig   calcium carbonate (TUMS EX) 750 MG chewable tablet Chew 1 tablet by mouth as needed for heartburn.   NON FORMULARY Sodium, potassium and magnesium solution.  15 drops in AM coffee   pantoprazole  (PROTONIX ) 40 MG tablet TAKE 1 TABLET BY MOUTH TWICE A DAY (Patient taking differently: Take 40 mg by mouth daily.)   UNABLE TO FIND Med Name: Liquid Iodine    VITAMIN D  PO Take by mouth as needed. D3   VITAMIN D -VITAMIN K PO Take by mouth daily.       11/21/2023    8:57 AM 09/26/2023    9:19 AM 05/26/2023    1:27 PM 04/23/2023    9:46 AM  GAD 7 : Generalized Anxiety Score  Nervous, Anxious, on Edge 0 2 0 0  Control/stop worrying 0 1 0 0  Worry too much - different things 0 2 0 0  Trouble relaxing 0 1 0 0  Restless 0 1 0 0  Easily annoyed or irritable 0  0 0  Afraid - awful might happen 0 0 0 0  Total GAD 7 Score 0  0 0  Anxiety Difficulty Not difficult at all Not difficult at all Not difficult at all Not difficult at all       11/21/2023    8:57 AM 09/26/2023    9:18 AM 05/26/2023    1:27 PM  Depression screen PHQ 2/9  Decreased Interest 0 0 0  Down, Depressed, Hopeless 0 0 0  PHQ - 2 Score 0 0 0  Altered sleeping 0 2 0  Tired, decreased energy 0 2 0  Change in appetite 0 0 0  Feeling bad or failure about yourself  0 0 0  Trouble concentrating 0 1 0  Moving slowly or fidgety/restless 0 1 0  Suicidal thoughts 0 0 0  PHQ-9 Score 0 6 0  Difficult doing work/chores Not difficult at all Not difficult at all Not difficult at all    BP Readings from Last 3 Encounters:  11/21/23 124/78  09/26/23 126/82  05/26/23 128/76    Physical  Exam Vitals and nursing note reviewed.  Constitutional:      General: She is not in acute distress.    Appearance: She is well-developed.  HENT:     Head: Normocephalic and atraumatic.   Cardiovascular:     Rate and Rhythm: Normal rate and regular rhythm.     Heart sounds: No murmur heard. Pulmonary:     Effort: Pulmonary effort is normal. No respiratory distress.     Breath sounds: No wheezing or rhonchi.  Abdominal:     Palpations: Abdomen is soft. There is no  mass.     Tenderness: There is no abdominal tenderness.   Skin:    General: Skin is warm and dry.     Findings: No rash.   Neurological:     Mental Status: She is alert and oriented to person, place, and time.   Psychiatric:        Mood and Affect: Mood normal.        Behavior: Behavior normal.     Wt Readings from Last 3 Encounters:  11/21/23 174 lb (78.9 kg)  09/26/23 182 lb 2 oz (82.6 kg)  05/26/23 176 lb (79.8 kg)    BP 124/78   Pulse 67   Ht 5' 5 (1.651 m)   Wt 174 lb (78.9 kg)   SpO2 97%   BMI 28.96 kg/m   Assessment and Plan:  Problem List Items Addressed This Visit       Unprioritized   Gastroesophageal reflux disease without esophagitis - Primary (Chronic)   Reflux symptoms improved but having dysphagia to any foods.   No regurgitation. Continue Pantoprazole  daily Will refer to GI      Relevant Orders   Ambulatory referral to Gastroenterology   Other Visit Diagnoses       Esophageal dysphagia       Relevant Orders   Ambulatory referral to Gastroenterology       No follow-ups on file.    Sheron Dixons, MD [facility] [place]

## 2023-11-23 ENCOUNTER — Emergency Department

## 2023-11-23 ENCOUNTER — Other Ambulatory Visit: Payer: Self-pay

## 2023-11-23 ENCOUNTER — Emergency Department
Admission: EM | Admit: 2023-11-23 | Discharge: 2023-11-23 | Disposition: A | Discharge status: Home / Self Care | Attending: Emergency Medicine | Admitting: Emergency Medicine

## 2023-11-23 DIAGNOSIS — R109 Unspecified abdominal pain: Secondary | ICD-10-CM | POA: Diagnosis present

## 2023-11-23 DIAGNOSIS — N200 Calculus of kidney: Secondary | ICD-10-CM

## 2023-11-23 DIAGNOSIS — N132 Hydronephrosis with renal and ureteral calculous obstruction: Secondary | ICD-10-CM | POA: Diagnosis not present

## 2023-11-23 DIAGNOSIS — N23 Unspecified renal colic: Secondary | ICD-10-CM

## 2023-11-23 LAB — CBC WITH DIFFERENTIAL/PLATELET
Abs Immature Granulocytes: 0.01 10*3/uL (ref 0.00–0.07)
Basophils Absolute: 0 10*3/uL (ref 0.0–0.1)
Basophils Relative: 1 %
Eosinophils Absolute: 0.1 10*3/uL (ref 0.0–0.5)
Eosinophils Relative: 3 %
HCT: 43.7 % (ref 36.0–46.0)
Hemoglobin: 14.4 g/dL (ref 12.0–15.0)
Immature Granulocytes: 0 %
Lymphocytes Relative: 54 %
Lymphs Abs: 2.3 10*3/uL (ref 0.7–4.0)
MCH: 25.9 pg — ABNORMAL LOW (ref 26.0–34.0)
MCHC: 33 g/dL (ref 30.0–36.0)
MCV: 78.6 fL — ABNORMAL LOW (ref 80.0–100.0)
Monocytes Absolute: 0.4 10*3/uL (ref 0.1–1.0)
Monocytes Relative: 10 %
Neutro Abs: 1.3 10*3/uL — ABNORMAL LOW (ref 1.7–7.7)
Neutrophils Relative %: 32 %
Platelets: 216 10*3/uL (ref 150–400)
RBC: 5.56 MIL/uL — ABNORMAL HIGH (ref 3.87–5.11)
RDW: 17.2 % — ABNORMAL HIGH (ref 11.5–15.5)
WBC: 4.2 10*3/uL (ref 4.0–10.5)
nRBC: 0 % (ref 0.0–0.2)

## 2023-11-23 LAB — URINALYSIS, W/ REFLEX TO CULTURE (INFECTION SUSPECTED)
Bacteria, UA: NONE SEEN
Bilirubin Urine: NEGATIVE
Glucose, UA: NEGATIVE mg/dL
Ketones, ur: 20 mg/dL — AB
Nitrite: NEGATIVE
Protein, ur: NEGATIVE mg/dL
Specific Gravity, Urine: 1.019 (ref 1.005–1.030)
pH: 5 (ref 5.0–8.0)

## 2023-11-23 LAB — COMPREHENSIVE METABOLIC PANEL WITH GFR
ALT: 16 U/L (ref 0–44)
AST: 21 U/L (ref 15–41)
Albumin: 3.7 g/dL (ref 3.5–5.0)
Alkaline Phosphatase: 55 U/L (ref 38–126)
Anion gap: 11 (ref 5–15)
BUN: 36 mg/dL — ABNORMAL HIGH (ref 8–23)
CO2: 20 mmol/L — ABNORMAL LOW (ref 22–32)
Calcium: 8.7 mg/dL — ABNORMAL LOW (ref 8.9–10.3)
Chloride: 105 mmol/L (ref 98–111)
Creatinine, Ser: 1.21 mg/dL — ABNORMAL HIGH (ref 0.44–1.00)
GFR, Estimated: 50 mL/min — ABNORMAL LOW (ref >60)
Glucose, Bld: 118 mg/dL — ABNORMAL HIGH (ref 70–99)
Potassium: 4 mmol/L (ref 3.5–5.1)
Sodium: 136 mmol/L (ref 135–145)
Total Bilirubin: 0.8 mg/dL (ref 0.0–1.2)
Total Protein: 6.9 g/dL (ref 6.5–8.1)

## 2023-11-23 MED ORDER — ONDANSETRON HCL 4 MG/2ML IJ SOLN
4.0000 mg | Freq: Once | INTRAMUSCULAR | Status: AC
  Administered 2023-11-23: 4 mg via INTRAVENOUS
  Filled 2023-11-23: qty 2

## 2023-11-23 MED ORDER — SODIUM CHLORIDE 0.9 % IV BOLUS
500.0000 mL | Freq: Once | INTRAVENOUS | Status: AC
  Administered 2023-11-23: 500 mL via INTRAVENOUS

## 2023-11-23 MED ORDER — MORPHINE SULFATE (PF) 4 MG/ML IV SOLN
4.0000 mg | Freq: Once | INTRAVENOUS | Status: AC
  Administered 2023-11-23: 4 mg via INTRAVENOUS
  Filled 2023-11-23: qty 1

## 2023-11-23 MED ORDER — MORPHINE SULFATE (PF) 4 MG/ML IV SOLN
6.0000 mg | Freq: Once | INTRAVENOUS | Status: AC
  Administered 2023-11-23: 6 mg via INTRAVENOUS
  Filled 2023-11-23: qty 2

## 2023-11-23 MED ORDER — TAMSULOSIN HCL 0.4 MG PO CAPS: 0.4000 mg | ORAL_CAPSULE | Freq: Every day | ORAL | 0 refills | Status: AC

## 2023-11-23 MED ORDER — ACETAMINOPHEN 500 MG PO TABS
1000.0000 mg | ORAL_TABLET | Freq: Once | ORAL | Status: AC
  Administered 2023-11-23: 1000 mg via ORAL
  Filled 2023-11-23: qty 2

## 2023-11-23 MED ORDER — KETOROLAC TROMETHAMINE 15 MG/ML IJ SOLN
15.0000 mg | Freq: Once | INTRAMUSCULAR | Status: AC
  Administered 2023-11-23: 15 mg via INTRAVENOUS
  Filled 2023-11-23: qty 1

## 2023-11-23 MED ORDER — DIPHENHYDRAMINE HCL 50 MG/ML IJ SOLN
25.0000 mg | Freq: Once | INTRAMUSCULAR | Status: AC
  Administered 2023-11-23: 25 mg via INTRAVENOUS
  Filled 2023-11-23: qty 1

## 2023-11-23 MED ORDER — OXYCODONE HCL 5 MG PO TABS: 5.0000 mg | ORAL_TABLET | Freq: Three times a day (TID) | ORAL | 0 refills | Status: DC | PRN

## 2023-11-23 NOTE — ED Triage Notes (Signed)
 Pt presented to ED with c/o left sided flank pain starting at 0330. Pt also endorses nausea and vomiting. Hx of kidney stones. Pt actively vomiting in triage.

## 2023-11-23 NOTE — Discharge Instructions (Addendum)
 Take acetaminophen  650 mg and ibuprofen  400 mg every 6 hours for pain.  Take with food. Take Flomax daily as prescribed.  For severe/breakthrough pain you may take oxycodone  as prescribed.  Call urologist for follow-up appointment. Call Dr Twylla (your previous urologist) or Dr. Nieves a call in the morning to schedule an appointment.  Thank you for choosing us  for your health care today!  Please see your primary doctor this week for a follow up appointment.   If you have any new, worsening, or unexpected symptoms call your doctor right away or come back to the emergency department for reevaluation.  It was my pleasure to care for you today.   Ginnie EDISON Cyrena, MD

## 2023-11-23 NOTE — ED Provider Notes (Signed)
 [facility] Provider Note    Event Date/Time   First MD Initiated Contact with Patient 11/23/23 [phone]     (approximate)   History   Flank Pain   HPI  Madeline Moon is a 63 y.o. female   Past medical history of kidney stones presents emergency department with sudden onset left-sided flank pain radiating to the groin in the middle of the night that woke her from sleep.  No trauma or obvious inciting event, though this is very reminiscent of her kidney stone pain in the past.  Intermittent severe pain.  No urinary symptoms preceding.  Independent Historian contributed to assessment above: Husband corroborates information above    Physical Exam   Triage Vital Signs: ED Triage Vitals  Encounter Vitals Group     BP      Girls Systolic BP Percentile      Girls Diastolic BP Percentile      Boys Systolic BP Percentile      Boys Diastolic BP Percentile      Pulse      Resp      Temp      Temp src      SpO2      Weight      Height      Head Circumference      Peak Flow      Pain Score      Pain Loc      Pain Education      Exclude from Growth Chart     Most recent vital signs: Vitals:   11/23/23 0421  BP: (!) 159/81  Pulse: (!) 57  Resp: 20  Temp: (!) 97.3 F (36.3 C)  SpO2: 98%    General: Awake, no distress.  CV:  Good peripheral perfusion.  Resp:  Normal effort.  Abd:  No distention.  Other:  Looks to be in pain clutching at her left side.  CVA tenderness on the left side.  Benign abdominal exam.   ED Results / Procedures / Treatments   Labs (all labs ordered are listed, but only abnormal results are displayed) Labs Reviewed  COMPREHENSIVE METABOLIC PANEL WITH GFR - Abnormal; Notable for the following components:      Result Value   CO2 20 (*)    Glucose, Bld 118 (*)    BUN 36 (*)    Creatinine, Ser 1.21 (*)    Calcium 8.7 (*)    GFR, Estimated 50 (*)    All other components within normal limits  CBC WITH  DIFFERENTIAL/PLATELET - Abnormal; Notable for the following components:   RBC 5.56 (*)    MCV 78.6 (*)    MCH 25.9 (*)    RDW 17.2 (*)    Neutro Abs 1.3 (*)    All other components within normal limits  URINALYSIS, W/ REFLEX TO CULTURE (INFECTION SUSPECTED)     I ordered and reviewed the above labs they are notable for creatinine is 1.21 slightly elevated from prior testing, white blood cell count normal.   RADIOLOGY I independently reviewed and interpreted CT of the abdomen pelvis to see a hyperdensity in the left ureteral system concerning for ureterolithiasis I also reviewed radiologist's formal read.   PROCEDURES:  Critical Care performed: No  Procedures   MEDICATIONS ORDERED IN ED: Medications  acetaminophen  (TYLENOL ) tablet 1,000 mg (0 mg Oral Hold 11/23/23 0436)  morphine (PF) 4 MG/ML injection 6 mg (has no administration in time range)  ketorolac  (TORADOL ) 15  MG/ML injection 15 mg (has no administration in time range)  ketorolac  (TORADOL ) 15 MG/ML injection 15 mg (15 mg Intravenous Given 11/23/23 0425)  morphine (PF) 4 MG/ML injection 4 mg (4 mg Intravenous Given 11/23/23 0425)  diphenhydrAMINE (BENADRYL) injection 25 mg (25 mg Intravenous Given 11/23/23 0425)  ondansetron  (ZOFRAN ) injection 4 mg (4 mg Intravenous Given 11/23/23 0424)  sodium chloride  0.9 % bolus 500 mL (0 mLs Intravenous Stopped 11/23/23 0512)    IMPRESSION / MDM / ASSESSMENT AND PLAN / ED COURSE  I reviewed the triage vital signs and the nursing notes.                                Patient's presentation is most consistent with acute presentation with potential threat to life or bodily function.  Differential diagnosis includes, but is not limited to, renal colic, ureteral stone, obstructive uropathy, urinary tract infection, considered but less likely other intra-abdominal pathology given benign exam of the abdomen  The patient is on the cardiac monitor to evaluate for evidence of arrhythmia  and/or significant heart rate changes.  MDM:    High suspicion for renal colic in the setting of known kidney stones in the past and pain consistent with this etiology and reminiscent of her prior episodes of kidney stones.  Will give pain medications, IV fluids, and CT renal.  Check urinalysis.   Reassess for  pain for disposition, if well-controlled and lab analysis/urinalysis shows no remarkable findings and CT confirms suspected stone, plan can be for discharge w urology fu.       FINAL CLINICAL IMPRESSION(S) / ED DIAGNOSES   Final diagnoses:  Kidney stone  Renal colic on left side     Rx / DC Orders   ED Discharge Orders          Ordered    oxyCODONE  (ROXICODONE ) 5 MG immediate release tablet  Every 8 hours PRN        11/23/23 0510             Note:  This document was prepared using Dragon voice recognition software and may include unintentional dictation errors.    Cyrena Mylar, MD 11/23/23 [phone]

## 2023-11-24 ENCOUNTER — Telehealth: Payer: Self-pay

## 2023-11-24 NOTE — Telephone Encounter (Signed)
 Pt LM on triage line requesting new pt appointment for kidney stones. Please schedule.

## 2023-11-25 ENCOUNTER — Telehealth: Payer: Self-pay

## 2023-11-25 NOTE — Transitions of Care (Post Inpatient/ED Visit) (Signed)
   11/25/2023  Name: Madeline Moon MRN: [id number] DOB: 1960-06-09  Today's TOC FU Call Status: Today's TOC FU Call Status:: Successful TOC FU Call Completed TOC FU Call Complete Date: 11/25/23 Patient's Name and Date of Birth confirmed.  Transition Care Management Follow-up Telephone Call Date of Discharge: 11/23/23 Discharge Facility: [facility]) Type of Discharge: Emergency Department Reason for ED Visit: Renal Renal Diagnosis:  (Kidney Stone) How have you been since you were released from the hospital?: Same Any questions or concerns?: No  Items Reviewed: Did you receive and understand the discharge instructions provided?: No Medications obtained,verified, and reconciled?: Yes (Medications Reviewed) Any new allergies since your discharge?: No Dietary orders reviewed?: NA Do you have support at home?: Yes People in Home [RPT]: spouse  Medications Reviewed Today: Medications Reviewed Today   Medications were not reviewed in this encounter     Home Care and Equipment/Supplies: Were Home Health Services Ordered?: NA Any new equipment or medical supplies ordered?: NA  Functional Questionnaire: Do you need assistance with bathing/showering or dressing?: No Do you need assistance with meal preparation?: No Do you need assistance with eating?: No Do you have difficulty maintaining continence: No Do you need assistance with getting out of bed/getting out of a chair/moving?: No Do you have difficulty managing or taking your medications?: No  Follow up appointments reviewed: PCP Follow-up appointment confirmed?: NA Specialist Hospital Follow-up appointment confirmed?: NA Do you need transportation to your follow-up appointment?: No Do you understand care options if your condition(s) worsen?: Yes-patient verbalized understanding    Madeline Moon [place]  Primary Care & Sports Medicine at [facility] CMA, AAMA [address]  [place] [address] Office [phone]  Fax: [phone]

## 2023-11-27 ENCOUNTER — Ambulatory Visit (INDEPENDENT_AMBULATORY_CARE_PROVIDER_SITE_OTHER): Admitting: Urology

## 2023-11-27 ENCOUNTER — Ambulatory Visit
Admission: RE | Admit: 2023-11-27 | Discharge: 2023-11-27 | Disposition: A | Discharge status: Home / Self Care | Attending: Urology | Admitting: Urology

## 2023-11-27 ENCOUNTER — Ambulatory Visit
Admission: RE | Admit: 2023-11-27 | Discharge: 2023-11-27 | Disposition: A | Discharge status: Home / Self Care | Source: Ambulatory Visit | Attending: Urology | Admitting: Urology

## 2023-11-27 VITALS — BP 134/84 | HR 76 | Ht 65.0 in | Wt 171.0 lb

## 2023-11-27 DIAGNOSIS — N201 Calculus of ureter: Secondary | ICD-10-CM

## 2023-11-27 DIAGNOSIS — N200 Calculus of kidney: Secondary | ICD-10-CM | POA: Insufficient documentation

## 2023-11-27 DIAGNOSIS — N202 Calculus of kidney with calculus of ureter: Secondary | ICD-10-CM | POA: Diagnosis not present

## 2023-11-27 DIAGNOSIS — N132 Hydronephrosis with renal and ureteral calculous obstruction: Secondary | ICD-10-CM

## 2023-11-27 LAB — URINALYSIS, COMPLETE
Bilirubin, UA: NEGATIVE
Glucose, UA: NEGATIVE
Leukocytes,UA: NEGATIVE
Nitrite, UA: NEGATIVE
Protein,UA: NEGATIVE
Specific Gravity, UA: 1.03 (ref 1.005–1.030)
Urobilinogen, Ur: 0.2 mg/dL (ref 0.2–1.0)
pH, UA: 6 (ref 5.0–7.5)

## 2023-11-27 LAB — MICROSCOPIC EXAMINATION

## 2023-11-28 ENCOUNTER — Encounter: Payer: Self-pay | Admitting: Urology

## 2023-11-28 ENCOUNTER — Emergency Department
Admission: EM | Admit: 2023-11-28 | Discharge: 2023-11-28 | Disposition: A | Discharge status: Home / Self Care | Attending: Emergency Medicine | Admitting: Emergency Medicine

## 2023-11-28 ENCOUNTER — Telehealth: Payer: Self-pay

## 2023-11-28 ENCOUNTER — Other Ambulatory Visit: Payer: Self-pay | Admitting: Urology

## 2023-11-28 ENCOUNTER — Other Ambulatory Visit: Payer: Self-pay

## 2023-11-28 DIAGNOSIS — R109 Unspecified abdominal pain: Secondary | ICD-10-CM | POA: Diagnosis present

## 2023-11-28 DIAGNOSIS — N23 Unspecified renal colic: Secondary | ICD-10-CM | POA: Diagnosis not present

## 2023-11-28 DIAGNOSIS — N201 Calculus of ureter: Secondary | ICD-10-CM

## 2023-11-28 LAB — CBC WITH DIFFERENTIAL/PLATELET
Abs Immature Granulocytes: 0.01 10*3/uL (ref 0.00–0.07)
Basophils Absolute: 0 10*3/uL (ref 0.0–0.1)
Basophils Relative: 1 %
Eosinophils Absolute: 0.1 10*3/uL (ref 0.0–0.5)
Eosinophils Relative: 5 %
HCT: 43.7 % (ref 36.0–46.0)
Hemoglobin: 14 g/dL (ref 12.0–15.0)
Immature Granulocytes: 0 %
Lymphocytes Relative: 37 %
Lymphs Abs: 1.1 10*3/uL (ref 0.7–4.0)
MCH: 26 pg (ref 26.0–34.0)
MCHC: 32 g/dL (ref 30.0–36.0)
MCV: 81.1 fL (ref 80.0–100.0)
Monocytes Absolute: 0.3 10*3/uL (ref 0.1–1.0)
Monocytes Relative: 10 %
Neutro Abs: 1.4 10*3/uL — ABNORMAL LOW (ref 1.7–7.7)
Neutrophils Relative %: 47 %
Platelets: 214 10*3/uL (ref 150–400)
RBC: 5.39 MIL/uL — ABNORMAL HIGH (ref 3.87–5.11)
RDW: 18 % — ABNORMAL HIGH (ref 11.5–15.5)
WBC: 3.1 10*3/uL — ABNORMAL LOW (ref 4.0–10.5)
nRBC: 0 % (ref 0.0–0.2)

## 2023-11-28 LAB — BASIC METABOLIC PANEL WITH GFR
Anion gap: 6 (ref 5–15)
BUN: 28 mg/dL — ABNORMAL HIGH (ref 8–23)
CO2: 27 mmol/L (ref 22–32)
Calcium: 8.9 mg/dL (ref 8.9–10.3)
Chloride: 107 mmol/L (ref 98–111)
Creatinine, Ser: 0.81 mg/dL (ref 0.44–1.00)
GFR, Estimated: >60 mL/min (ref >60)
Glucose, Bld: 102 mg/dL — ABNORMAL HIGH (ref 70–99)
Potassium: 4.1 mmol/L (ref 3.5–5.1)
Sodium: 140 mmol/L (ref 135–145)

## 2023-11-28 LAB — URINALYSIS, W/ REFLEX TO CULTURE (INFECTION SUSPECTED)
Bacteria, UA: NONE SEEN
Bilirubin Urine: NEGATIVE
Glucose, UA: NEGATIVE mg/dL
Ketones, ur: NEGATIVE mg/dL
Nitrite: NEGATIVE
Protein, ur: NEGATIVE mg/dL
Specific Gravity, Urine: 1.018 (ref 1.005–1.030)
pH: 5 (ref 5.0–8.0)

## 2023-11-28 MED ORDER — OXYCODONE HCL 5 MG PO TABS
5.0000 mg | ORAL_TABLET | Freq: Once | ORAL | Status: AC
  Administered 2023-11-28: 5 mg via ORAL
  Filled 2023-11-28: qty 1

## 2023-11-28 MED ORDER — ACETAMINOPHEN 500 MG PO TABS
1000.0000 mg | ORAL_TABLET | Freq: Once | ORAL | Status: AC
  Administered 2023-11-28: 1000 mg via ORAL
  Filled 2023-11-28: qty 2

## 2023-11-28 MED ORDER — DIPHENHYDRAMINE HCL 50 MG/ML IJ SOLN
25.0000 mg | Freq: Once | INTRAMUSCULAR | Status: AC
  Administered 2023-11-28: 25 mg via INTRAVENOUS
  Filled 2023-11-28: qty 1

## 2023-11-28 MED ORDER — MORPHINE SULFATE (PF) 4 MG/ML IV SOLN
4.0000 mg | Freq: Once | INTRAVENOUS | Status: AC
  Administered 2023-11-28: 4 mg via INTRAVENOUS
  Filled 2023-11-28: qty 1

## 2023-11-28 MED ORDER — OXYCODONE HCL 5 MG PO TABS: 5.0000 mg | ORAL_TABLET | Freq: Three times a day (TID) | ORAL | 0 refills | Status: DC | PRN

## 2023-11-28 MED ORDER — ONDANSETRON HCL 4 MG/2ML IJ SOLN
4.0000 mg | Freq: Once | INTRAMUSCULAR | Status: AC
  Administered 2023-11-28: 4 mg via INTRAVENOUS
  Filled 2023-11-28: qty 2

## 2023-11-28 MED ORDER — KETOROLAC TROMETHAMINE 15 MG/ML IJ SOLN
15.0000 mg | Freq: Once | INTRAMUSCULAR | Status: AC
  Administered 2023-11-28: 15 mg via INTRAVENOUS
  Filled 2023-11-28: qty 1

## 2023-11-28 MED ORDER — SODIUM CHLORIDE 0.9 % IV BOLUS
1000.0000 mL | Freq: Once | INTRAVENOUS | Status: AC
  Administered 2023-11-28: 1000 mL via INTRAVENOUS

## 2023-11-28 NOTE — ED Provider Notes (Addendum)
 [facility] Provider Note    Event Date/Time   First MD Initiated Contact with Patient 11/28/23 [phone]     (approximate)   History   Flank Pain   HPI  Madeline Moon is a 63 y.o. female   Past medical history of recently diagnosed 5 to 6 mm left ureterolithiasis who presents to the emergency department with recurrent flank pain quite severe colicky pain reminiscent of her pain just several days ago when she was first diagnosed with a kidney stone.  In the interim her pain has been well-controlled with her home pain medications but severe pain restarted this morning while sleeping around 4:30 AM.  It is a left-sided flank pain radiates to the front groin area.  She saw Dr. Twylla at urology yesterday and an uneventful appointment, got a x-ray that showed the stone in the pelvis.  Independent Historian contributed to assessment above: Husband corroborates information above  External Medical Documents Reviewed: Abdominal x-ray obtained yesterday showing that the proximal to mid left ureteral calculus measuring 6 mm on the prior CT now present in the left pelvis.      Physical Exam   Triage Vital Signs: ED Triage Vitals  Encounter Vitals Group     BP      Girls Systolic BP Percentile      Girls Diastolic BP Percentile      Boys Systolic BP Percentile      Boys Diastolic BP Percentile      Pulse      Resp      Temp      Temp src      SpO2      Weight      Height      Head Circumference      Peak Flow      Pain Score      Pain Loc      Pain Education      Exclude from Growth Chart     Most recent vital signs: Vitals:   11/28/23 0556 11/28/23 0648  BP: (!) 157/104   Pulse: (!) 56   Resp: 18   Temp: 97.7 F (36.5 C)   SpO2: 100% 100%    General: Awake, no distress.  CV:  Good peripheral perfusion.  Resp:  Normal effort.  Abd:  No distention.  Other:  Appears to be in pain, hypertensive, left CVA tenderness with a benign  abdominal exam with no rigidity or guarding to deep palpation all quadrants.   ED Results / Procedures / Treatments   Labs (all labs ordered are listed, but only abnormal results are displayed) Labs Reviewed  BASIC METABOLIC PANEL WITH GFR - Abnormal; Notable for the following components:      Result Value   Glucose, Bld 102 (*)    BUN 28 (*)    All other components within normal limits  CBC WITH DIFFERENTIAL/PLATELET - Abnormal; Notable for the following components:   WBC 3.1 (*)    RBC 5.39 (*)    RDW 18.0 (*)    Neutro Abs 1.4 (*)    All other components within normal limits  URINALYSIS, W/ REFLEX TO CULTURE (INFECTION SUSPECTED) - Abnormal; Notable for the following components:   Color, Urine YELLOW (*)    APPearance CLEAR (*)    Hgb urine dipstick SMALL (*)    Leukocytes,Ua TRACE (*)    All other components within normal limits     I ordered and reviewed the above  labs they are notable for leukopenia not far from her baseline    PROCEDURES:  Critical Care performed: No  Procedures   MEDICATIONS ORDERED IN ED: Medications  oxyCODONE  (Oxy IR/ROXICODONE ) immediate release tablet 5 mg (has no administration in time range)  sodium chloride  0.9 % bolus 1,000 mL (1,000 mLs Intravenous New Bag/Given 11/28/23 0624)  ondansetron  (ZOFRAN ) injection 4 mg (4 mg Intravenous Given 11/28/23 9372)  morphine  (PF) 4 MG/ML injection 4 mg (4 mg Intravenous Given 11/28/23 9378)  diphenhydrAMINE  (BENADRYL ) injection 25 mg (25 mg Intravenous Given 11/28/23 0625)  ketorolac  (TORADOL ) 15 MG/ML injection 15 mg (15 mg Intravenous Given 11/28/23 0626)  acetaminophen  (TYLENOL ) tablet 1,000 mg (1,000 mg Oral Given 11/28/23 9371)   IMPRESSION / MDM / ASSESSMENT AND PLAN / ED COURSE  I reviewed the triage vital signs and the nursing notes.                                Patient's presentation is most consistent with acute presentation with potential threat to life or bodily  function.  Differential diagnosis includes, but is not limited to, renal colic ureterolithiasis, obstructive uropathy, urine infection   The patient is on the cardiac monitor to evaluate for evidence of arrhythmia and/or significant heart rate changes.  MDM:    Known left sided ureteral stone with a flareup of intense pain overnight consistent with her renal colic symptoms that brought her to the emergency department several days ago.  Will give IV treatment pain medications that helped relieve pain last time including IV morphine /Benadryl /Toradol  and Tylenol  and give a fluid bolus as well.  Defer imaging given known ureterolithiasis with progression into the lower tract on repeat imaging just yesterday.  Check basic labs for kidney function, and check urinalysis for concomitant urine infection.  Patient would like us  to contact her urologist, which we can do after seeing urine/blood work and response to pain medication.  Spoke with on-call urologist Dr. Alvaro who is working out of [facility] for procedures today, should the patient need transfer for uncontrolled pain procedure today.  I spoke with the patient and her pain is very well-controlled at this time she would like to go home and will call Dr. Neale office today for follow-up appointment.  She understands to return with any new or worsening symptoms.  She wants to get an additional few oxycodone  to make it through the weekend for which I will prescribe her.       FINAL CLINICAL IMPRESSION(S) / ED DIAGNOSES   Final diagnoses:  Renal colic on left side     Rx / DC Orders   ED Discharge Orders          Ordered    oxyCODONE  (ROXICODONE ) 5 MG immediate release tablet  Every 8 hours PRN        11/28/23 0732             Note:  This document was prepared using Dragon voice recognition software and may include unintentional dictation errors.    Cyrena Mylar, MD 11/28/23 9351    Cyrena Mylar, MD 11/28/23  [phone]

## 2023-11-28 NOTE — H&P (View-Only) (Signed)
 11/27/2023 7:22 AM   [name] 12-04-1960 [id number]  Referring provider: No referring provider defined for this encounter.  Chief Complaint  Patient presents with   Nephrolithiasis    HPI: Carollynn Pennywell is a 63 y.o. female presents in follow-up of recent ED visit for renal colic.  Presented to ED 11/23/2023 with acute onset of left flank pain radiating to the groin.  No identifiable precipitating, aggravating or alleviating factors.  Intensity was rated severe.  No fever or chills, + nausea/vomiting Prior history of stone disease and presenting symptoms were very similar Evaluation in the ED with UA showing 6-10 RBC.  Pain significant proved with parenteral analgesics and antiemetics. CT renal stone study with bilateral, nonobstructing renal calculi.  Moderate left hydronephrosis with hydroureter to a 5 mm left proximal ureteral calculus She had intermittent pain until 6/25 when her pain completely resolved and she thinks she may have passed the stone States I saw her >10 years ago and she underwent shockwave lithotripsy then required ureteroscopy due to incomplete stone fragmentation   PMH: Past Medical History:  Diagnosis Date   COVID-19 05/15/2021   also had covid previously, pt unsure of date   Depression, major, single episode, complete remission (HCC) 12/16/2014   only for a few months, then completely resolved   GERD (gastroesophageal reflux disease)    History of kidney stones    around 2014   History of prediabetes    09/19/21 Hgb A1c improved to 5.5   Pleurisy 2022   Pneumonia 06/07/2020   pneumonia after having Covid, see 06/07/20 chest xray in Care Everywhere   Shoulder pain, right 2021   resolved per pt   Vertigo    occasional dizziness   Wears glasses     Surgical History: Past Surgical History:  Procedure Laterality Date   BLADDER SUSPENSION N/A 07/01/2022   Procedure: TRANSVAGINAL TAPE (TVT) PROCEDURE;  Surgeon: Marilynne Rosaline SAILOR,  MD;  Location: [facility];  Service: Gynecology;  Laterality: N/A;   COLONOSCOPY  2013   COLONOSCOPY  2011   COLONOSCOPY WITH PROPOFOL  N/A 10/08/2021   Procedure: COLONOSCOPY WITH PROPOFOL ;  Surgeon: Jinny Carmine, MD;  [facility];  Service: Endoscopy;  Laterality: N/A;   CYSTOSCOPY N/A 07/01/2022   Procedure: CYSTOSCOPY;  Surgeon: Marilynne Rosaline SAILOR, MD;  Location: [facility];  Service: Gynecology;  Laterality: N/A;   PERINEOPLASTY N/A 07/01/2022   Procedure: PERINEOPLASTY;  Surgeon: Marilynne Rosaline SAILOR, MD;  Location: [facility];  Service: Gynecology;  Laterality: N/A;   RIGHT OOPHORECTOMY     pt not sure if her ovary was removed or just her tube   TUBAL LIGATION     XI ROBOTIC ASSISTED TOTAL HYSTERECTOMY WITH SACROCOLPOPEXY N/A 07/01/2022   Procedure: XI ROBOTIC ASSISTED TOTAL HYSTERECTOMY WITH BILATERAL SALPINGO-OOPHORECTOMY AND SACROCOLPOPEXY;  Surgeon: Marilynne Rosaline SAILOR, MD;  Location: [facility];  Service: Gynecology;  Laterality: N/A;  total time requested is 3.5 hours    Home Medications:  Allergies as of 11/27/2023       Reactions   Augmentin  [amoxicillin -pot Clavulanate] Rash   Morphine  Itching   Nose itching and burning. Patient stated she would take Morphine  if she really needed it.   Oxycodone  Itching        Medication List        Accurate as of November 27, 2023 11:59 PM. If you have any questions, ask your nurse or doctor.  STOP taking these medications    NON FORMULARY   VITAMIN D  PO       TAKE these medications    calcium carbonate 750 MG chewable tablet Commonly known as: TUMS EX Chew 1 tablet by mouth as needed for heartburn.   Magnesium 400 MG Tabs Take by mouth.   oxyCODONE  5 MG immediate release tablet Commonly known as: Roxicodone  Take 1 tablet (5 mg total) by mouth every 8 (eight) hours as needed.   pantoprazole  40 MG tablet Commonly known  as: PROTONIX  TAKE 1 TABLET BY MOUTH TWICE A DAY What changed:  when to take this additional instructions   Potassium 99 MG Tabs Take by mouth.   tamsulosin  0.4 MG Caps capsule Commonly known as: FLOMAX  Take 1 capsule (0.4 mg total) by mouth daily.   UNABLE TO FIND Med Name: Liquid Iodine    VITAMIN D -VITAMIN K PO Take by mouth daily.        Allergies:  Allergies  Allergen Reactions   Augmentin  [Amoxicillin -Pot Clavulanate] Rash   Morphine  Itching    Nose itching and burning. Patient stated she would take Morphine  if she really needed it.   Oxycodone  Itching    Family History: Family History  Problem Relation Age of Onset   Dementia Mother    CAD Father    AAA (abdominal aortic aneurysm) Father    Thyroid  nodules Father    AAA (abdominal aortic aneurysm) Brother    Thyroid  nodules Paternal Grandmother    Colon cancer Paternal Grandfather 3   Breast cancer Neg Hx     Social History:  reports that she has never smoked. She has never used smokeless tobacco. She reports that she does not currently use alcohol. She reports that she does not use drugs.   Physical Exam: BP 134/84   Pulse 76   Ht 5' 5 (1.651 m)   Wt 171 lb (77.6 kg)   BMI 28.46 kg/m   Constitutional:  Alert and oriented, No acute distress. HEENT: [place] AT Respiratory: Normal respiratory effort, no increased work of breathing. Psychiatric: Normal mood and affect.  Laboratory Data: Lab Results  Component Value Date   WBC 3.1 (L) 11/28/2023   HGB 14.0 11/28/2023   HCT 43.7 11/28/2023   MCV 81.1 11/28/2023   PLT 214 11/28/2023    Lab Results  Component Value Date   CREATININE 0.81 11/28/2023    Urinalysis Dipstick 1+ blood/trace ketone Microscopy 3-10 RBC   Pertinent Imaging: CT images were personally reviewed and interpreted and agree with radiology interpretation  CT Renal Stone Study  Narrative CLINICAL DATA:  Abdominal pain and flank pain.  EXAM: CT ABDOMEN AND PELVIS  WITHOUT CONTRAST  TECHNIQUE: Multidetector CT imaging of the abdomen and pelvis was performed following the standard protocol without IV contrast.  RADIATION DOSE REDUCTION: This exam was performed according to the departmental dose-optimization program which includes automated exposure control, adjustment of the mA and/or kV according to patient size and/or use of iterative reconstruction technique.  COMPARISON:  None Available.  FINDINGS: Lower chest: No acute abnormality.  Hepatobiliary: 2 cysts noted within the liver. Largest is in segment 4 measuring 2.1 cm, image 19/2. No suspicious liver lesion. Gallbladder appears normal. No bile duct dilatation.  Pancreas: Unremarkable. No pancreatic ductal dilatation or surrounding inflammatory changes.  Spleen: Normal in size without focal abnormality.  Adrenals/Urinary Tract: Normal adrenal glands. Right kidney normal. Multiple upper and lower pole left renal calculi are identified. The largest is in the lower pole measuring  4 mm, image 38/2. There is left-sided hydronephrosis and hydroureter. Within the proximal left ureter there is a 5 mm stone, image 50/2. No additional ureteral calculi identified. Urinary bladder is normal.  Stomach/Bowel: Stomach appears normal. The appendix is visualized and appears normal. No bowel wall thickening, inflammation or distension. Distal colonic diverticula without signs of acute diverticulitis.  Vascular/Lymphatic: No significant vascular findings are present. No enlarged abdominal or pelvic lymph nodes.  Reproductive: Status post hysterectomy. No adnexal masses.  Other: No free fluid or fluid collections. No signs of pneumoperitoneum.  Musculoskeletal: No acute or significant osseous findings.  IMPRESSION: 1. Left-sided hydronephrosis and hydroureter secondary to 5 mm proximal left ureteral calculus. 2. Additional nonobstructing left renal calculi. 3. Distal colonic diverticulosis  without signs of acute diverticulitis.   Electronically Signed By: Waddell Calk M.D. On: 11/23/2023 05:24   Assessment & Plan:    1.  Left proximal ureteral calculus Presently asymptomatic and thinks he may have passed a stone A KUB was ordered to see if calculus still visualized If present options of trial of passage, SWL and ureteroscopy were discussed  2.  Bilateral nephrolithiasis Bilateral, nonobstructing renal calculi Recommend metabolic evaluation once acute stone episode resolved   [name], MD  [facility] [address] [place], [place] [address] [phone]

## 2023-11-28 NOTE — Telephone Encounter (Signed)
 Talked with patient and she would like to have surgery on Tuesday .

## 2023-11-28 NOTE — Progress Notes (Signed)
 11/27/2023 7:22 AM   Madeline Moon 12-04-1960 [id number]  Referring provider: No referring provider defined for this encounter.  Chief Complaint  Patient presents with   Nephrolithiasis    HPI: Madeline Moon is a 63 y.o. female presents in follow-up of recent ED visit for renal colic.  Presented to ED 11/23/2023 with acute onset of left flank pain radiating to the groin.  No identifiable precipitating, aggravating or alleviating factors.  Intensity was rated severe.  No fever or chills, + nausea/vomiting Prior history of stone disease and presenting symptoms were very similar Evaluation in the ED with UA showing 6-10 RBC.  Pain significant proved with parenteral analgesics and antiemetics. CT renal stone study with bilateral, nonobstructing renal calculi.  Moderate left hydronephrosis with hydroureter to a 5 mm left proximal ureteral calculus She had intermittent pain until 6/25 when her pain completely resolved and she thinks she may have passed the stone States I saw her >10 years ago and she underwent shockwave lithotripsy then required ureteroscopy due to incomplete stone fragmentation   PMH: Past Medical History:  Diagnosis Date   COVID-19 05/15/2021   also had covid previously, pt unsure of date   Depression, major, single episode, complete remission (HCC) 12/16/2014   only for a few months, then completely resolved   GERD (gastroesophageal reflux disease)    History of kidney stones    around 2014   History of prediabetes    09/19/21 Hgb A1c improved to 5.5   Pleurisy 2022   Pneumonia 06/07/2020   pneumonia after having Covid, see 06/07/20 chest xray in Care Everywhere   Shoulder pain, right 2021   resolved per pt   Vertigo    occasional dizziness   Wears glasses     Surgical History: Past Surgical History:  Procedure Laterality Date   BLADDER SUSPENSION N/A 07/01/2022   Procedure: TRANSVAGINAL TAPE (TVT) PROCEDURE;  Surgeon: Marilynne Rosaline SAILOR,  MD;  Location: [facility];  Service: Gynecology;  Laterality: N/A;   COLONOSCOPY  2013   COLONOSCOPY  2011   COLONOSCOPY WITH PROPOFOL  N/A 10/08/2021   Procedure: COLONOSCOPY WITH PROPOFOL ;  Surgeon: Jinny Carmine, MD;  Location[facility];  Service: Endoscopy;  Laterality: N/A;   CYSTOSCOPY N/A 07/01/2022   Procedure: CYSTOSCOPY;  Surgeon: Marilynne Rosaline SAILOR, MD;  Location: [facility];  Service: Gynecology;  Laterality: N/A;   PERINEOPLASTY N/A 07/01/2022   Procedure: PERINEOPLASTY;  Surgeon: Marilynne Rosaline SAILOR, MD;  Location: [facility];  Service: Gynecology;  Laterality: N/A;   RIGHT OOPHORECTOMY     pt not sure if her ovary was removed or just her tube   TUBAL LIGATION     XI ROBOTIC ASSISTED TOTAL HYSTERECTOMY WITH SACROCOLPOPEXY N/A 07/01/2022   Procedure: XI ROBOTIC ASSISTED TOTAL HYSTERECTOMY WITH BILATERAL SALPINGO-OOPHORECTOMY AND SACROCOLPOPEXY;  Surgeon: Marilynne Rosaline SAILOR, MD;  Location: [facility];  Service: Gynecology;  Laterality: N/A;  total time requested is 3.5 hours    Home Medications:  Allergies as of 11/27/2023       Reactions   Augmentin  [amoxicillin -pot Clavulanate] Rash   Morphine  Itching   Nose itching and burning. Patient stated she would take Morphine  if she really needed it.   Oxycodone  Itching        Medication List        Accurate as of November 27, 2023 11:59 PM. If you have any questions, ask your nurse or doctor.  STOP taking these medications    NON FORMULARY   VITAMIN D  PO       TAKE these medications    calcium carbonate 750 MG chewable tablet Commonly known as: TUMS EX Chew 1 tablet by mouth as needed for heartburn.   Magnesium 400 MG Tabs Take by mouth.   oxyCODONE  5 MG immediate release tablet Commonly known as: Roxicodone  Take 1 tablet (5 mg total) by mouth every 8 (eight) hours as needed.   pantoprazole  40 MG tablet Commonly known  as: PROTONIX  TAKE 1 TABLET BY MOUTH TWICE A DAY What changed:  when to take this additional instructions   Potassium 99 MG Tabs Take by mouth.   tamsulosin  0.4 MG Caps capsule Commonly known as: FLOMAX  Take 1 capsule (0.4 mg total) by mouth daily.   UNABLE TO FIND Med Name: Liquid Iodine    VITAMIN D -VITAMIN K PO Take by mouth daily.        Allergies:  Allergies  Allergen Reactions   Augmentin  [Amoxicillin -Pot Clavulanate] Rash   Morphine  Itching    Nose itching and burning. Patient stated she would take Morphine  if she really needed it.   Oxycodone  Itching    Family History: Family History  Problem Relation Age of Onset   Dementia Mother    CAD Father    AAA (abdominal aortic aneurysm) Father    Thyroid  nodules Father    AAA (abdominal aortic aneurysm) Brother    Thyroid  nodules Paternal Grandmother    Colon cancer Paternal Grandfather 3   Breast cancer Neg Hx     Social History:  reports that she has never smoked. She has never used smokeless tobacco. She reports that she does not currently use alcohol. She reports that she does not use drugs.   Physical Exam: BP 134/84   Pulse 76   Ht 5' 5 (1.651 m)   Wt 171 lb (77.6 kg)   BMI 28.46 kg/m   Constitutional:  Alert and oriented, No acute distress. HEENT: [place] AT Respiratory: Normal respiratory effort, no increased work of breathing. Psychiatric: Normal mood and affect.  Laboratory Data: Lab Results  Component Value Date   WBC 3.1 (L) 11/28/2023   HGB 14.0 11/28/2023   HCT 43.7 11/28/2023   MCV 81.1 11/28/2023   PLT 214 11/28/2023    Lab Results  Component Value Date   CREATININE 0.81 11/28/2023    Urinalysis Dipstick 1+ blood/trace ketone Microscopy 3-10 RBC   Pertinent Imaging: CT images were personally reviewed and interpreted and agree with radiology interpretation  CT Renal Stone Study  Narrative CLINICAL DATA:  Abdominal pain and flank pain.  EXAM: CT ABDOMEN AND PELVIS  WITHOUT CONTRAST  TECHNIQUE: Multidetector CT imaging of the abdomen and pelvis was performed following the standard protocol without IV contrast.  RADIATION DOSE REDUCTION: This exam was performed according to the departmental dose-optimization program which includes automated exposure control, adjustment of the mA and/or kV according to patient size and/or use of iterative reconstruction technique.  COMPARISON:  None Available.  FINDINGS: Lower chest: No acute abnormality.  Hepatobiliary: 2 cysts noted within the liver. Largest is in segment 4 measuring 2.1 cm, image 19/2. No suspicious liver lesion. Gallbladder appears normal. No bile duct dilatation.  Pancreas: Unremarkable. No pancreatic ductal dilatation or surrounding inflammatory changes.  Spleen: Normal in size without focal abnormality.  Adrenals/Urinary Tract: Normal adrenal glands. Right kidney normal. Multiple upper and lower pole left renal calculi are identified. The largest is in the lower pole measuring  4 mm, image 38/2. There is left-sided hydronephrosis and hydroureter. Within the proximal left ureter there is a 5 mm stone, image 50/2. No additional ureteral calculi identified. Urinary bladder is normal.  Stomach/Bowel: Stomach appears normal. The appendix is visualized and appears normal. No bowel wall thickening, inflammation or distension. Distal colonic diverticula without signs of acute diverticulitis.  Vascular/Lymphatic: No significant vascular findings are present. No enlarged abdominal or pelvic lymph nodes.  Reproductive: Status post hysterectomy. No adnexal masses.  Other: No free fluid or fluid collections. No signs of pneumoperitoneum.  Musculoskeletal: No acute or significant osseous findings.  IMPRESSION: 1. Left-sided hydronephrosis and hydroureter secondary to 5 mm proximal left ureteral calculus. 2. Additional nonobstructing left renal calculi. 3. Distal colonic diverticulosis  without signs of acute diverticulitis.   Electronically Signed By: Waddell Calk M.D. On: 11/23/2023 05:24   Assessment & Plan:    1.  Left proximal ureteral calculus Presently asymptomatic and thinks he may have passed a stone A KUB was ordered to see if calculus still visualized If present options of trial of passage, SWL and ureteroscopy were discussed  2.  Bilateral nephrolithiasis Bilateral, nonobstructing renal calculi Recommend metabolic evaluation once acute stone episode resolved   [name], MD  [facility] [address] [place], [place] [address] [phone]

## 2023-11-28 NOTE — ED Triage Notes (Signed)
 Patient C/O left flank pain that began about an hour ago. Patient has a known kidney stone that she had thought she already passed. She states that she took oxycodone  and flomax  around 0500 this morning.

## 2023-11-28 NOTE — Discharge Instructions (Signed)
 Call Dr. Neale office this morning to let them know of your emergency department visit and pain.  Take acetaminophen  650 mg and ibuprofen  400 mg every 6 hours for pain.  Take with food. Make sure you are taking both the acetaminophen  and ibuprofen  as directed and only take the oxycodone  for severe pain that is not better with the above medications.  Continue taking Flomax .  Thank you for choosing us  for your health care today!  Please see your primary doctor this week for a follow up appointment.   If you have any new, worsening, or unexpected symptoms call your doctor right away or come back to the emergency department for reevaluation.  It was my pleasure to care for you today.   Ginnie EDISON Cyrena, MD

## 2023-11-28 NOTE — Transitions of Care (Post Inpatient/ED Visit) (Signed)
   11/28/2023  Name: Madeline Moon MRN: [id number] DOB: 1961-02-17  Today's TOC FU Call Status: Today's TOC FU Call Status:: Successful TOC FU Call Completed TOC FU Call Complete Date: 11/28/23 Patient's Name and Date of Birth confirmed.  Transition Care Management Follow-up Telephone Call Date of Discharge: 11/28/23 Discharge Facility: [facility]) Type of Discharge: Emergency Department Reason for ED Visit: Other: How have you been since you were released from the hospital?: Same Any questions or concerns?: No  Items Reviewed: Did you receive and understand the discharge instructions provided?: No Medications obtained,verified, and reconciled?: Yes (Medications Reviewed) Any new allergies since your discharge?: No Dietary orders reviewed?: NA Do you have support at home?: Yes  Medications Reviewed Today: Medications Reviewed Today   Medications were not reviewed in this encounter     Home Care and Equipment/Supplies: Were Home Health Services Ordered?: NA Any new equipment or medical supplies ordered?: NA  Functional Questionnaire: Do you need assistance with bathing/showering or dressing?: No Do you need assistance with meal preparation?: No Do you need assistance with eating?: No Do you have difficulty maintaining continence: No Do you need assistance with getting out of bed/getting out of a chair/moving?: No Do you have difficulty managing or taking your medications?: No  Follow up appointments reviewed: PCP Follow-up appointment confirmed?: NA Specialist Hospital Follow-up appointment confirmed?: Yes Do you need transportation to your follow-up appointment?: No Do you understand care options if your condition(s) worsen?: Yes-patient verbalized understanding    Shira Bobst Burgin  Primary Care & Sports Medicine at [facility] CMA, AAMA [address]  [place] [address] Office [phone]  Fax:  [phone]

## 2023-11-29 ENCOUNTER — Other Ambulatory Visit: Payer: Self-pay | Admitting: Internal Medicine

## 2023-11-29 DIAGNOSIS — K219 Gastro-esophageal reflux disease without esophagitis: Secondary | ICD-10-CM

## 2023-12-01 ENCOUNTER — Telehealth: Payer: Self-pay

## 2023-12-01 ENCOUNTER — Other Ambulatory Visit: Payer: Self-pay

## 2023-12-01 DIAGNOSIS — N200 Calculus of kidney: Secondary | ICD-10-CM

## 2023-12-01 DIAGNOSIS — N201 Calculus of ureter: Secondary | ICD-10-CM

## 2023-12-01 NOTE — Progress Notes (Signed)
 Surgical Physician Order Form [facility]   Dr. Glendia Barba, MD  * Scheduling expectation : Tuesday 7/1  *Length of Case: 60 minutes  *Clearance needed: no  *Anticoagulation Instructions: N/A  *Aspirin Instructions: N/A  *Post-op visit Date/Instructions:  1 week cysto stent removal  *Diagnosis: Left Ureteral Stone; left nephrolithiasis  *Procedure: Left Ureteroscopy w/laser lithotripsy & stent placement (47643)   Additional orders: N/A  -Admit type: OUTpatient  -Anesthesia: General  -VTE Prophylaxis Standing Order SCD's       Other:   -Standing Lab Orders Per Anesthesia    Lab other: None  -Standing Test orders EKG/Chest x-ray per Anesthesia       Test other:   - Medications:  Ancef  2gm IV  -Other orders:  N/A

## 2023-12-01 NOTE — Progress Notes (Signed)
   [facility] Surgical Posting Form  Surgery Date: Date: 12/02/2023  Surgeon: Dr. Glendia Barba, MD  Inpt ( No  )   Outpt (Yes)   Obs ( No  )   Diagnosis: N20.0 Left Nephrolithiasis, N20.1 Left Ureteral Stone  -CPT: [phone]  Surgery: Left Ureteroscopy with Laser Lithotripsy and Stent Placement   Stop Anticoagulations: No  Cardiac/Medical/Pulmonary Clearance needed: no  *Orders entered into EPIC  Date: 12/01/23   *Case booked in [place]  Date: 12/01/23  *Notified pt of Surgery: Date: 12/01/23  PRE-OP UA & CX: no  *Placed into Prior Authorization Work Que Date: 12/01/23  Assistant/laser/rep:No

## 2023-12-01 NOTE — Telephone Encounter (Signed)
 Per Dr. Twylla, Patient is to be scheduled for  Left Ureteroscopy with Laser Lithotripsy and Stent Placement   Mrs. Coggins was contacted and possible surgical dates were discussed, Tuesday July 1st, 2025 was agreed upon for surgery.   Patient was directed to call [phone] between 1-3pm the day before surgery to find out surgical arrival time.  Instructions were given not to eat or drink from midnight on the night before surgery and have a driver for the day of surgery. On the surgery day patient was instructed to enter through the [facility] entrance of [facility] report the Same Day Surgery desk.   Pre-Admit Testing will be in contact via phone to set up an interview with the anesthesia team to review your history and medications prior to surgery.   Reminder of this information was sent via MyChart to the patient.

## 2023-12-02 ENCOUNTER — Ambulatory Visit

## 2023-12-02 ENCOUNTER — Other Ambulatory Visit: Payer: Self-pay

## 2023-12-02 ENCOUNTER — Encounter
Admission: RE | Disposition: A | Discharge status: Home / Self Care | Payer: Self-pay | Source: Home / Self Care | Attending: Urology

## 2023-12-02 ENCOUNTER — Ambulatory Visit: Admitting: Anesthesiology

## 2023-12-02 ENCOUNTER — Ambulatory Visit
Admission: RE | Admit: 2023-12-02 | Discharge: 2023-12-02 | Disposition: A | Discharge status: Home / Self Care | Attending: Urology | Admitting: Urology

## 2023-12-02 ENCOUNTER — Encounter: Payer: Self-pay | Admitting: Urology

## 2023-12-02 DIAGNOSIS — K5730 Diverticulosis of large intestine without perforation or abscess without bleeding: Secondary | ICD-10-CM | POA: Diagnosis not present

## 2023-12-02 DIAGNOSIS — N201 Calculus of ureter: Secondary | ICD-10-CM

## 2023-12-02 DIAGNOSIS — Z87442 Personal history of urinary calculi: Secondary | ICD-10-CM | POA: Diagnosis not present

## 2023-12-02 DIAGNOSIS — M1990 Unspecified osteoarthritis, unspecified site: Secondary | ICD-10-CM | POA: Diagnosis not present

## 2023-12-02 DIAGNOSIS — K219 Gastro-esophageal reflux disease without esophagitis: Secondary | ICD-10-CM | POA: Diagnosis not present

## 2023-12-02 DIAGNOSIS — N132 Hydronephrosis with renal and ureteral calculous obstruction: Secondary | ICD-10-CM | POA: Insufficient documentation

## 2023-12-02 DIAGNOSIS — N200 Calculus of kidney: Secondary | ICD-10-CM

## 2023-12-02 HISTORY — PX: CYSTOSCOPY/URETEROSCOPY/HOLMIUM LASER/STENT PLACEMENT: SHX6546

## 2023-12-02 SURGERY — CYSTOSCOPY/URETEROSCOPY/HOLMIUM LASER/STENT PLACEMENT
Anesthesia: General | Site: Ureter | Laterality: Left

## 2023-12-02 MED ORDER — SODIUM CHLORIDE 0.9 % IR SOLN
Status: DC | PRN
Start: 2023-12-02 — End: 2023-12-02
  Administered 2023-12-02: 3000 mL via INTRAVESICAL

## 2023-12-02 MED ORDER — STERILE WATER FOR IRRIGATION IR SOLN
Status: DC | PRN
  Administered 2023-12-02: 500 mL

## 2023-12-02 MED ORDER — OXYBUTYNIN CHLORIDE 5 MG PO TABS: ORAL_TABLET | ORAL | 0 refills | Status: DC

## 2023-12-02 MED ORDER — EPHEDRINE 5 MG/ML INJ
INTRAVENOUS | Status: AC
  Filled 2023-12-02: qty 5

## 2023-12-02 MED ORDER — ACETAMINOPHEN 10 MG/ML IV SOLN
INTRAVENOUS | Status: DC | PRN
  Administered 2023-12-02: 1000 mg via INTRAVENOUS

## 2023-12-02 MED ORDER — EPHEDRINE SULFATE (PRESSORS) 50 MG/ML IJ SOLN
INTRAMUSCULAR | Status: DC | PRN
Start: 2023-12-02 — End: 2023-12-02
  Administered 2023-12-02 (×3): 5 mg via INTRAVENOUS

## 2023-12-02 MED ORDER — GLYCOPYRROLATE 0.2 MG/ML IJ SOLN
INTRAMUSCULAR | Status: AC
  Filled 2023-12-02: qty 1

## 2023-12-02 MED ORDER — LIDOCAINE HCL (CARDIAC) PF 100 MG/5ML IV SOSY
PREFILLED_SYRINGE | INTRAVENOUS | Status: DC | PRN
  Administered 2023-12-02: 100 mg via INTRAVENOUS

## 2023-12-02 MED ORDER — FENTANYL CITRATE (PF) 100 MCG/2ML IJ SOLN
INTRAMUSCULAR | Status: DC | PRN
  Administered 2023-12-02: 50 ug via INTRAVENOUS

## 2023-12-02 MED ORDER — CHLORHEXIDINE GLUCONATE 0.12 % MT SOLN
OROMUCOSAL | Status: AC
  Filled 2023-12-02: qty 15

## 2023-12-02 MED ORDER — OXYBUTYNIN CHLORIDE 5 MG PO TABS
ORAL_TABLET | ORAL | Status: AC
  Filled 2023-12-02: qty 1

## 2023-12-02 MED ORDER — DEXAMETHASONE SODIUM PHOSPHATE 10 MG/ML IJ SOLN
INTRAMUSCULAR | Status: DC | PRN
  Administered 2023-12-02: 10 mg via INTRAVENOUS

## 2023-12-02 MED ORDER — ONDANSETRON HCL 4 MG/2ML IJ SOLN
INTRAMUSCULAR | Status: DC | PRN
  Administered 2023-12-02: 4 mg via INTRAVENOUS

## 2023-12-02 MED ORDER — DROPERIDOL 2.5 MG/ML IJ SOLN: 0.6250 mg | Freq: Once | INTRAMUSCULAR | Status: DC | PRN

## 2023-12-02 MED ORDER — CEFAZOLIN SODIUM-DEXTROSE 2-4 GM/100ML-% IV SOLN
INTRAVENOUS | Status: AC
  Filled 2023-12-02: qty 100

## 2023-12-02 MED ORDER — LACTATED RINGERS IV SOLN: INTRAVENOUS | Status: DC | PRN

## 2023-12-02 MED ORDER — FENTANYL CITRATE (PF) 100 MCG/2ML IJ SOLN: 25.0000 ug | INTRAMUSCULAR | Status: DC | PRN

## 2023-12-02 MED ORDER — FENTANYL CITRATE (PF) 100 MCG/2ML IJ SOLN
INTRAMUSCULAR | Status: AC
  Filled 2023-12-02: qty 2

## 2023-12-02 MED ORDER — CHLORHEXIDINE GLUCONATE 0.12 % MT SOLN
15.0000 mL | Freq: Once | OROMUCOSAL | Status: AC
  Administered 2023-12-02: 15 mL via OROMUCOSAL

## 2023-12-02 MED ORDER — KETOROLAC TROMETHAMINE 30 MG/ML IJ SOLN
INTRAMUSCULAR | Status: DC | PRN
  Administered 2023-12-02: 30 mg via INTRAVENOUS

## 2023-12-02 MED ORDER — DIPHENHYDRAMINE HCL 50 MG/ML IJ SOLN: 12.5000 mg | Freq: Once | INTRAMUSCULAR | Status: DC | PRN

## 2023-12-02 MED ORDER — ACETAMINOPHEN 10 MG/ML IV SOLN
INTRAVENOUS | Status: AC
  Filled 2023-12-02: qty 100

## 2023-12-02 MED ORDER — OXYCODONE HCL 5 MG PO TABS: 5.0000 mg | ORAL_TABLET | Freq: Once | ORAL | Status: DC | PRN

## 2023-12-02 MED ORDER — LACTATED RINGERS IV SOLN: INTRAVENOUS | Status: DC

## 2023-12-02 MED ORDER — OXYBUTYNIN CHLORIDE 5 MG PO TABS
5.0000 mg | ORAL_TABLET | Freq: Once | ORAL | Status: AC
  Administered 2023-12-02: 5 mg via ORAL
  Filled 2023-12-02: qty 1

## 2023-12-02 MED ORDER — OXYCODONE HCL 5 MG/5ML PO SOLN: 5.0000 mg | Freq: Once | ORAL | Status: DC | PRN

## 2023-12-02 MED ORDER — ORAL CARE MOUTH RINSE: 15.0000 mL | Freq: Once | OROMUCOSAL | Status: AC

## 2023-12-02 MED ORDER — ACETAMINOPHEN 10 MG/ML IV SOLN: 1000.0000 mg | Freq: Once | INTRAVENOUS | Status: DC | PRN

## 2023-12-02 MED ORDER — MIDAZOLAM HCL 2 MG/2ML IJ SOLN
INTRAMUSCULAR | Status: AC
  Filled 2023-12-02: qty 2

## 2023-12-02 MED ORDER — IOHEXOL 180 MG/ML  SOLN
INTRAMUSCULAR | Status: DC | PRN
Start: 2023-12-02 — End: 2023-12-02
  Administered 2023-12-02: 10 mL

## 2023-12-02 MED ORDER — ESMOLOL HCL 100 MG/10ML IV SOLN
INTRAVENOUS | Status: DC | PRN
  Administered 2023-12-02: 20 mg via INTRAVENOUS

## 2023-12-02 MED ORDER — SUCCINYLCHOLINE CHLORIDE 200 MG/10ML IV SOSY
PREFILLED_SYRINGE | INTRAVENOUS | Status: DC | PRN
  Administered 2023-12-02: 100 mg via INTRAVENOUS

## 2023-12-02 MED ORDER — PHENYLEPHRINE HCL (PRESSORS) 10 MG/ML IV SOLN
INTRAVENOUS | Status: DC | PRN
Start: 2023-12-02 — End: 2023-12-02
  Administered 2023-12-02 (×2): 80 ug via INTRAVENOUS

## 2023-12-02 MED ORDER — PROPOFOL 10 MG/ML IV BOLUS
INTRAVENOUS | Status: DC | PRN
  Administered 2023-12-02: 140 mg via INTRAVENOUS

## 2023-12-02 MED ORDER — PHENYLEPHRINE 80 MCG/ML (10ML) SYRINGE FOR IV PUSH (FOR BLOOD PRESSURE SUPPORT)
PREFILLED_SYRINGE | INTRAVENOUS | Status: AC
  Filled 2023-12-02: qty 10

## 2023-12-02 MED ORDER — GLYCOPYRROLATE 0.2 MG/ML IJ SOLN
INTRAMUSCULAR | Status: DC | PRN
  Administered 2023-12-02 (×2): .1 mg via INTRAVENOUS

## 2023-12-02 MED ORDER — MIDAZOLAM HCL 2 MG/2ML IJ SOLN
INTRAMUSCULAR | Status: DC | PRN
  Administered 2023-12-02: 2 mg via INTRAVENOUS

## 2023-12-02 MED ORDER — CEFAZOLIN SODIUM-DEXTROSE 2-4 GM/100ML-% IV SOLN
2.0000 g | INTRAVENOUS | Status: AC
  Administered 2023-12-02: 2 g via INTRAVENOUS

## 2023-12-02 SURGICAL SUPPLY — 23 items
BAG DRAIN SIEMENS DORNER NS (MISCELLANEOUS) ×1 IMPLANT
BASKET ZERO TIP 1.9FR (BASKET) IMPLANT
BRUSH SCRUB EZ 4% CHG (MISCELLANEOUS) ×1 IMPLANT
CATH URET FLEX-TIP 2 LUMEN 10F (CATHETERS) IMPLANT
CATH URETL OPEN END 6X70 (CATHETERS) IMPLANT
CNTNR URN SCR LID CUP LEK RST (MISCELLANEOUS) IMPLANT
DRAPE UTILITY 15X26 TOWEL STRL (DRAPES) ×1 IMPLANT
FIBER LASER MOSES 200 DFL (Laser) IMPLANT
FIBER LASER MOSES 365 DFL (Laser) IMPLANT
GLOVE BIOGEL PI IND STRL 7.5 (GLOVE) ×1 IMPLANT
GOWN STRL REUS W/ TWL LRG LVL3 (GOWN DISPOSABLE) ×1 IMPLANT
GOWN STRL REUS W/ TWL XL LVL3 (GOWN DISPOSABLE) ×1 IMPLANT
GUIDEWIRE STR DUAL SENSOR (WIRE) ×1 IMPLANT
KIT TURNOVER CYSTO (KITS) ×1 IMPLANT
PACK CYSTO AR (MISCELLANEOUS) ×1 IMPLANT
SET CYSTO W/LG BORE CLAMP LF (SET/KITS/TRAYS/PACK) ×1 IMPLANT
SHEATH NAVIGATOR HD 12/14X36 (SHEATH) IMPLANT
SOL .9 NS 3000ML IRR UROMATIC (IV SOLUTION) ×1 IMPLANT
STENT URET 6FRX24 CONTOUR (STENTS) IMPLANT
STENT URET 6FRX26 CONTOUR (STENTS) IMPLANT
SURGILUBE 2OZ TUBE FLIPTOP (MISCELLANEOUS) ×1 IMPLANT
VALVE UROSEAL ADJ ENDO (VALVE) IMPLANT
WATER STERILE IRR 500ML POUR (IV SOLUTION) ×1 IMPLANT

## 2023-12-02 NOTE — Anesthesia Preprocedure Evaluation (Addendum)
 Anesthesia Evaluation  Patient identified by MRN, date of birth, ID band Patient awake    Reviewed: Allergy & Precautions, H&P , NPO status , Patient's Chart, lab work & pertinent test results  Airway Mallampati: II  TM Distance: >3 FB Neck ROM: full    Dental no notable dental hx.    Pulmonary neg pulmonary ROS   Pulmonary exam normal        Cardiovascular negative cardio ROS Normal cardiovascular exam     Neuro/Psych negative neurological ROS  negative psych ROS   GI/Hepatic Neg liver ROS,GERD  Poorly Controlled,,  Endo/Other  negative endocrine ROS    Renal/GU      Musculoskeletal  (+) Arthritis ,    Abdominal Normal abdominal exam  (+)   Peds  Hematology negative hematology ROS (+)   Anesthesia Other Findings Past Medical History: 05/15/2021: COVID-19     Comment:  also had covid previously, pt unsure of date 12/16/2014: Depression, major, single episode, complete remission  (HCC)     Comment:  only for a few months, then completely resolved No date: GERD (gastroesophageal reflux disease) No date: History of kidney stones     Comment:  around 2014 No date: History of prediabetes     Comment:  09/19/21 Hgb A1c improved to 5.5 2022: Pleurisy 06/07/2020: Pneumonia     Comment:  pneumonia after having Covid, see 06/07/20 chest xray in               Care Everywhere 2021: Shoulder pain, right     Comment:  resolved per pt No date: Vertigo     Comment:  occasional dizziness No date: Wears glasses  Past Surgical History: 07/01/2022: BLADDER SUSPENSION; N/A     Comment:  Procedure: TRANSVAGINAL TAPE (TVT) PROCEDURE;  Surgeon:               Marilynne Rosaline SAILOR, MD;  Location: [facility];  Service: Gynecology;  Laterality: N/A; 2013: COLONOSCOPY 2011: COLONOSCOPY 10/08/2021: COLONOSCOPY WITH PROPOFOL ; N/A     Comment:  Procedure: COLONOSCOPY WITH PROPOFOL ;  Surgeon: Jinny Carmine, MD;  Location: [facility];  Service:               Endoscopy;  Laterality: N/A; 07/01/2022: CYSTOSCOPY; N/A     Comment:  Procedure: CYSTOSCOPY;  Surgeon: Marilynne Rosaline SAILOR,               MD;  Location: [facility]  Service:               Gynecology;  Laterality: N/A; 07/01/2022: PERINEOPLASTY; N/A     Comment:  Procedure: PERINEOPLASTY;  Surgeon: Marilynne Rosaline SAILOR, MD;  Location: [facility];  Service:               Gynecology;  Laterality: N/A; No date: RIGHT OOPHORECTOMY     Comment:  pt not sure if her ovary was removed or just her tube No date: TUBAL LIGATION 07/01/2022: XI ROBOTIC ASSISTED TOTAL HYSTERECTOMY WITH  SACROCOLPOPEXY; N/A     Comment:  Procedure: XI ROBOTIC ASSISTED TOTAL HYSTERECTOMY WITH               BILATERAL SALPINGO-OOPHORECTOMY AND SACROCOLPOPEXY;  Surgeon: Marilynne Rosaline SAILOR, MD;  Location: [facility];  Service: Gynecology;  Laterality:               N/A;  total time requested is 3.5 hours  BMI    Body Mass Index: 28.29 kg/m      Reproductive/Obstetrics negative OB ROS                             Anesthesia Physical Anesthesia Plan  ASA: 2  Anesthesia Plan: General   Post-op Pain Management: Minimal or no pain anticipated   Induction: Intravenous  PONV Risk Score and Plan: Dexamethasone , Ondansetron  and Midazolam   Airway Management Planned: Oral ETT  Additional Equipment:   Intra-op Plan:   Post-operative Plan: Extubation in OR  Informed Consent: I have reviewed the patients History and Physical, chart, labs and discussed the procedure including the risks, benefits and alternatives for the proposed anesthesia with the patient or authorized representative who has indicated his/her understanding and acceptance.     Dental Advisory Given  Plan Discussed with: Anesthesiologist, CRNA and  Surgeon  Anesthesia Plan Comments:        Anesthesia Quick Evaluation

## 2023-12-02 NOTE — Anesthesia Procedure Notes (Signed)
 Procedure Name: Intubation Date/Time: 12/02/2023 4:54 PM  Performed by: Claudene Cornet, CRNAPre-anesthesia Checklist: Patient identified, Emergency Drugs available, Suction available and Patient being monitored Patient Re-evaluated:Patient Re-evaluated prior to induction Oxygen Delivery Method: Circle system utilized Preoxygenation: Pre-oxygenation with 100% oxygen Induction Type: IV induction Ventilation: Mask ventilation without difficulty Laryngoscope Size: McGrath and 3 Grade View: Grade I Tube type: Oral Tube size: 7.0 mm Number of attempts: 1 Airway Equipment and Method: Stylet Placement Confirmation: ETT inserted through vocal cords under direct vision, positive ETCO2 and breath sounds checked- equal and bilateral Secured at: 21 cm Tube secured with: Tape Dental Injury: Teeth and Oropharynx as per pre-operative assessment

## 2023-12-02 NOTE — Discharge Instructions (Signed)
 DISCHARGE INSTRUCTIONS FOR KIDNEY STONE/URETERAL STENT   MEDICATIONS:  1. Resume all your other meds from home.  2.  AZO (over-the-counter) can help with the burning/stinging when you urinate. 3.  Take oxycodone  if needed 4.  Tamsulosin  will help with bladder/stent irritation 5.  Rx oxybutynin  was sent to pharmacy which will help with stent/bladder irritation  ACTIVITY:  1. May resume regular activities in 24 hours. 2. No driving while on narcotic pain medications  3. Drink plenty of water   4. Continue to walk at home - you can still get blood clots when you are at home, so keep active, but don't over do it.  5. May return to work/school tomorrow or when you feel ready   BATHING:  1. You can shower. 2. You have a string coming from your urethra: The stent string is attached to your ureteral stent. Do not pull on this.   SIGNS/SYMPTOMS TO CALL:  Common postoperative symptoms include urinary frequency, urgency, bladder spasm and blood in the urine  Please call us  if you have a fever greater than 101.5, uncontrolled nausea/vomiting, uncontrolled pain, dizziness, unable to urinate, excessively bloody urine, chest pain, shortness of breath, leg swelling, leg pain, or any other concerns or questions.   You can reach us  at [phone].   FOLLOW-UP:  1. You will be contacted by our office for a follow-up appointment in approximately 1 month 2. You have a string attached to your stent, you may remove it on Friday, 12/05/2023. To do this, pull the string until the stent is completely removed. You may feel an odd sensation in your back.

## 2023-12-02 NOTE — Telephone Encounter (Signed)
 Requested Prescriptions  Pending Prescriptions Disp Refills   pantoprazole  (PROTONIX ) 40 MG tablet [Pharmacy Med Name: PANTOPRAZOLE  SOD DR 40 MG TAB] 60 tablet 2    Sig: TAKE 1 TABLET BY MOUTH TWICE A DAY     Gastroenterology: Proton Pump Inhibitors Passed - 12/02/2023  7:59 AM      Passed - Valid encounter within last 12 months    Recent Outpatient Visits           1 week ago Gastroesophageal reflux disease without esophagitis   [place] [facility] at [facility], Leita DEL, MD   2 months ago Annual physical exam   [facility] at [facility], Leita DEL, MD

## 2023-12-02 NOTE — Transfer of Care (Signed)
 Immediate Anesthesia Transfer of Care Note  Patient: Madeline Moon  Procedure(s) Performed: CYSTOSCOPY/URETEROSCOPY/HOLMIUM LASER/STENT PLACEMENT (Left: Ureter)  Patient Location: PACU  Anesthesia Type:General  Level of Consciousness: awake and drowsy  Airway & Oxygen Therapy: Patient Spontanous Breathing and Patient connected to face mask oxygen  Post-op Assessment: Report given to RN  Post vital signs: Reviewed  Last Vitals:  Vitals Value Taken Time  BP 140/80 12/02/23 18:05  Temp 36.2 C 12/02/23 18:06  Pulse 84 12/02/23 18:08  Resp 15 12/02/23 18:08  SpO2 100 % 12/02/23 18:08  Vitals shown include unfiled device data.  Last Pain:  Vitals:   12/02/23 1432  TempSrc: Temporal  PainSc: 0-No pain         Complications: No notable events documented.

## 2023-12-02 NOTE — Interval H&P Note (Signed)
 History and Physical Interval Note:  12/02/2023 4:39 PM  Madeline Moon  has presented today for surgery, with the diagnosis of Left Nephrolithiasis Left Ureteral Stone.  The various methods of treatment have been discussed with the patient and family. After consideration of risks, benefits and other options for treatment, the patient has consented to  Procedure(s): CYSTOSCOPY/URETEROSCOPY/HOLMIUM LASER/STENT PLACEMENT (Left) as a surgical intervention.  The patient's history has been reviewed, patient examined, no change in status, stable for surgery.  I have reviewed the patient's chart and labs.  Questions were answered to the patient's satisfaction.    CV:RRR Lungs:clear  [name]

## 2023-12-03 ENCOUNTER — Encounter: Payer: Self-pay | Admitting: Urology

## 2023-12-03 NOTE — Anesthesia Postprocedure Evaluation (Signed)
 Anesthesia Post Note  Patient: Mireyah Chervenak  Procedure(s) Performed: CYSTOSCOPY/URETEROSCOPY/HOLMIUM LASER/STENT PLACEMENT (Left: Ureter)  Patient location during evaluation: PACU Anesthesia Type: General Level of consciousness: awake and alert Pain management: pain level controlled Vital Signs Assessment: post-procedure vital signs reviewed and stable Respiratory status: spontaneous breathing, nonlabored ventilation, respiratory function stable and patient connected to nasal cannula oxygen Cardiovascular status: blood pressure returned to baseline and stable Postop Assessment: no apparent nausea or vomiting Anesthetic complications: no   No notable events documented.   Last Vitals:  Vitals:   12/02/23 1830 12/02/23 1844  BP: 139/87 133/83  Pulse:  78  Resp:  16  Temp: (!) 36.1 C 36.4 C  SpO2:  99%    Last Pain:  Vitals:   12/02/23 1844  TempSrc: Temporal  PainSc: 0-No pain                 Jasmeen Fritsch C Emersyn Kotarski

## 2023-12-04 ENCOUNTER — Telehealth: Payer: Self-pay

## 2023-12-04 NOTE — Telephone Encounter (Signed)
 Pt LM on triage line-   She states she had surgery for a Kidney stone and her urine is red. She stopped the bladder spasm med. Is this normal?   Pt is s/p Ureteroscopy with stent placement for left ureteral stone on 7/1 with SCS.  She has a dark red urine. No fevers, n/v, or pain. She did have a mild discomfort in her back. Felt like a muscle spasm that lasted about 10 minutes. She is not taking any pain meds at this time. No blood clots.   Pt advised that blood in the urine was a common after surgery. Pt advised to contact office if she is unable to urinate. She has fevers or uncontrolled pain. Advised pt she can remove the stent tomorrow.   Pt voiced understanding.

## 2023-12-05 ENCOUNTER — Other Ambulatory Visit: Payer: Self-pay

## 2023-12-05 ENCOUNTER — Emergency Department
Admission: EM | Admit: 2023-12-05 | Discharge: 2023-12-05 | Discharge status: Against Medical Advice | Attending: Emergency Medicine | Admitting: Emergency Medicine

## 2023-12-05 DIAGNOSIS — R109 Unspecified abdominal pain: Secondary | ICD-10-CM | POA: Insufficient documentation

## 2023-12-05 DIAGNOSIS — Z5321 Procedure and treatment not carried out due to patient leaving prior to being seen by health care provider: Secondary | ICD-10-CM | POA: Diagnosis not present

## 2023-12-05 DIAGNOSIS — R319 Hematuria, unspecified: Secondary | ICD-10-CM | POA: Insufficient documentation

## 2023-12-05 HISTORY — DX: Disorder of kidney and ureter, unspecified: N28.9

## 2023-12-05 LAB — BASIC METABOLIC PANEL WITH GFR
Anion gap: 10 (ref 5–15)
BUN: 22 mg/dL (ref 8–23)
CO2: 25 mmol/L (ref 22–32)
Calcium: 8.9 mg/dL (ref 8.9–10.3)
Chloride: 102 mmol/L (ref 98–111)
Creatinine, Ser: 0.96 mg/dL (ref 0.44–1.00)
GFR, Estimated: >60 mL/min (ref >60)
Glucose, Bld: 155 mg/dL — ABNORMAL HIGH (ref 70–99)
Potassium: 3.7 mmol/L (ref 3.5–5.1)
Sodium: 137 mmol/L (ref 135–145)

## 2023-12-05 LAB — URINALYSIS, ROUTINE W REFLEX MICROSCOPIC
Bacteria, UA: NONE SEEN
Bilirubin Urine: NEGATIVE
Glucose, UA: NEGATIVE mg/dL
Ketones, ur: NEGATIVE mg/dL
Nitrite: NEGATIVE
Protein, ur: 30 mg/dL — AB
RBC / HPF: >50 RBC/hpf (ref 0–5)
Specific Gravity, Urine: 1.011 (ref 1.005–1.030)
pH: 5 (ref 5.0–8.0)

## 2023-12-05 LAB — CBC
HCT: 44 % (ref 36.0–46.0)
Hemoglobin: 14 g/dL (ref 12.0–15.0)
MCH: 25.7 pg — ABNORMAL LOW (ref 26.0–34.0)
MCHC: 31.8 g/dL (ref 30.0–36.0)
MCV: 80.9 fL (ref 80.0–100.0)
Platelets: 206 K/uL (ref 150–400)
RBC: 5.44 MIL/uL — ABNORMAL HIGH (ref 3.87–5.11)
RDW: 17.9 % — ABNORMAL HIGH (ref 11.5–15.5)
WBC: 4.7 K/uL (ref 4.0–10.5)
nRBC: 0 % (ref 0.0–0.2)

## 2023-12-05 NOTE — ED Triage Notes (Signed)
 To ED POV for L flank pain since this AM around 0830. Had L kidney stone removed on Tuesday, then had ureteral stent removed this AM and then the pain started. Has urinated since then. Some hematuria, which she has been having. Also 3 clots about the size of pencil eraser.

## 2023-12-07 NOTE — Op Note (Signed)
   Preoperative diagnosis:  Left distal ureteral calculus  Postoperative diagnosis:  Left distal ureteral calculus  Procedure:  Cystoscopy Left ureteroscopy and stone removal Ureteroscopic laser lithotripsy Left ureteral stent placement (29F/24 cm) Left retrograde pyelography with interpretation  Surgeon: Glendia C. Chiyoko Torrico, M.D.  Anesthesia: General  Complications: None  Intraoperative findings:  Cystoscopy: Bladder mucosa without solid or papillary lesions; UOs normal-appearing bilaterally. Ureteroscopy: Non-impacted calculus distal ureter Left retrograde pyelography post procedure showed no filling defects, stone fragments or contrast extravasation  EBL: Minimal  Specimens: Calculus fragments for analysis   Indication: Madeline Moon is a 63 y.o.  female with a 6 mm ureteral calculus with intermittent renal colic requiring 2 ED visits. After reviewing the management options for treatment, the patient elected to proceed with the above surgical procedure(s). We have discussed the potential benefits and risks of the procedure, side effects of the proposed treatment, the likelihood of the patient achieving the goals of the procedure, and any potential problems that might occur during the procedure or recuperation. Informed consent has been obtained.  Description of procedure:  The patient was taken to the operating room and general anesthesia was induced.  The patient was placed in the dorsal lithotomy position, prepped and draped in the usual sterile fashion, and preoperative antibiotics were administered. A preoperative time-out was performed.   A 21 French cystoscope was lubricated and placed per urethra.  Panendoscopy was performed with findings as described above  Attention was directed to the left ureteral orifice and a 0.038 Sensor wire was then advanced up the ureter into the renal pelvis under fluoroscopic guidance.  A 4.5 Fr semirigid ureteroscope was then  advanced into the ureter next to the guidewire and the calculus was identified here as scribed above.  The stone was then fragmented with a 365 m Moses holmium laser fiber at a setting of 0.3 J/40 hz.   Fragments which chipped off the stone during dusting were then removed from the ureter with a zero tip nitinol basket.  Reinspection of the ureter revealed no remaining visible stones or fragments.  She had punctate nonobstructing left renal calculi and a single channel digital flexible ureteroscope was placed per urethra.  The scope could not be inserted into the ureter alongside the guidewire.  An Amplatz Super Stiff wire was then placed through the ureteroscope and advanced proximally to the renal pelvis.  The scope met resistance in the midportion of the distal ureter and it was elected at this point to end the procedure and place a ureteral stent.  Retrograde pyelogram was performed with findings as described above.  A 6 F/24 cm Contour ureteral stent with tether was placed under fluoroscopic guidance.  The wire was then removed with an adequate stent curl noted in the renal pelvis as well as in the bladder.  The bladder was then emptied and the procedure ended.  The patient appeared to tolerate the procedure well and without complications.  After anesthetic reversal the patient was transported to the PACU in stable condition.   Plan: She was instructed to remove her stent on Friday, 12/05/2023 Postop follow-up will be scheduled in ~1 month   Glendia Barba, MD

## 2023-12-08 ENCOUNTER — Other Ambulatory Visit: Payer: Self-pay | Admitting: Urology

## 2023-12-08 ENCOUNTER — Telehealth: Payer: Self-pay

## 2023-12-08 MED ORDER — OXYBUTYNIN CHLORIDE 5 MG PO TABS: ORAL_TABLET | ORAL | 0 refills | Status: DC

## 2023-12-08 NOTE — Telephone Encounter (Signed)
 Pt LM on triage line stating that she removed her ureteral stent on Friday, since that time she has experienced some soreness and pain. Pain is relieved with NSAIDS and oxybutynin  however pain resumes within a few hours and she states she has to then take an oxycodone  tablet for relief. She questions if this is normal.   Called pt back no answer. LM for pt informing her to call back.

## 2023-12-08 NOTE — Telephone Encounter (Signed)
 Pt called triage line and called office-  Pt states she was seen 7/4 at the ED for flank pain. They got her vitals, u/a, and b/w. As she was waiting she was pushing fluids and felt better.   She did remove her stent on Friday. She is having on/of flank pain since. Her urine is clear. Tiny blood clot once a day only.   Taking oxycodone  as needed.   No fever or n/v.   Pt advised to take oxybutynin  and 1000mg  tylenol  q 6h. Oxycodone  if needed.   Refill for oxybutynin  sent to pharmacy per pt request.   Pt voiced understanding.

## 2023-12-08 NOTE — Addendum Note (Signed)
 Addended by: CLEOTILDE CAMELIA MATSU on: 12/08/2023 10:50 AM   Modules accepted: Orders

## 2023-12-12 ENCOUNTER — Telehealth: Payer: Self-pay

## 2023-12-12 ENCOUNTER — Encounter: Payer: Self-pay | Admitting: Emergency Medicine

## 2023-12-12 ENCOUNTER — Ambulatory Visit
Admission: EM | Admit: 2023-12-12 | Discharge: 2023-12-12 | Disposition: A | Discharge status: Home / Self Care | Attending: Emergency Medicine | Admitting: Emergency Medicine

## 2023-12-12 DIAGNOSIS — N390 Urinary tract infection, site not specified: Secondary | ICD-10-CM | POA: Diagnosis present

## 2023-12-12 LAB — URINALYSIS, W/ REFLEX TO CULTURE (INFECTION SUSPECTED)
RBC / HPF: >50 RBC/hpf (ref 0–5)
Squamous Epithelial / HPF: NONE SEEN /HPF (ref 0–5)
WBC, UA: >50 WBC/hpf (ref 0–5)

## 2023-12-12 MED ORDER — SULFAMETHOXAZOLE-TRIMETHOPRIM 800-160 MG PO TABS: 1.0000 | ORAL_TABLET | Freq: Two times a day (BID) | ORAL | 0 refills | Status: AC

## 2023-12-12 MED ORDER — PHENAZOPYRIDINE HCL 200 MG PO TABS: 200.0000 mg | ORAL_TABLET | Freq: Three times a day (TID) | ORAL | 0 refills | Status: DC

## 2023-12-12 NOTE — ED Triage Notes (Signed)
 Patient c/o dysuria and urinary frequency that on 12/05/23.  Patient states that she went to the ED for UTI symptoms and blood work and urinalysis done but left the then ED on 12/05/23.  Patient states that she was not treated at the ED.  Patient denies fevers.

## 2023-12-12 NOTE — ED Provider Notes (Signed)
 [facility]    CSN: [id number] Arrival date & time: 12/12/23  0804      History   Chief Complaint Chief Complaint  Patient presents with   Dysuria    HPI Madeline Moon is a 63 y.o. female.   HPI  63 year old female with past medical history significant for GERD, prediabetes, kidney stones with recent kidney stone removal on 12/02/2023 presents for evaluation of painful urination and urinary frequency.  She reports that symptoms initially began on 12/05/2023, she went to the ER, but left before being seen.  She reports that she felt better and then the symptoms returned and forced last night.  No associated fever, low back pain, nausea, or vomiting.  Patient has been taking Flomax , Ditropan , Azo, and oxycodone .  Past Medical History:  Diagnosis Date   COVID-19 05/15/2021   also had covid previously, pt unsure of date   Depression, major, single episode, complete remission (HCC) 12/16/2014   only for a few months, then completely resolved   GERD (gastroesophageal reflux disease)    History of kidney stones    around 2014   History of prediabetes    09/19/21 Hgb A1c improved to 5.5   Pleurisy 2022   Pneumonia 06/07/2020   pneumonia after having Covid, see 06/07/20 chest xray in [facility]   Renal disorder    kidney stones   Shoulder pain, right 2021   resolved per pt   Vertigo    occasional dizziness   Wears glasses     Patient Active Problem List   Diagnosis Date Noted   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) 09/24/2022   Uterovaginal prolapse, incomplete 07/01/2022   Prediabetes 10/08/2020   Chronic right shoulder pain 02/28/2020   Elevated blood pressure reading 10/07/2019   Gastroesophageal reflux disease without esophagitis 10/06/2018   Palmar fascial fibromatosis 10/06/2018   Hyperlipidemia, mild 08/12/2016   Mixed urge and stress incontinence 11/09/2014   Osteoarthritis 11/09/2014    Past Surgical  History:  Procedure Laterality Date   BLADDER SUSPENSION N/A 07/01/2022   Procedure: TRANSVAGINAL TAPE (TVT) PROCEDURE;  Surgeon: Marilynne Rosaline SAILOR, MD;  Location: [facility];  Service: Gynecology;  Laterality: N/A;   COLONOSCOPY  2013   COLONOSCOPY  2011   COLONOSCOPY WITH PROPOFOL  N/A 10/08/2021   Procedure: COLONOSCOPY WITH PROPOFOL ;  Surgeon: Jinny Carmine, MD;  Location: [facility];  Service: Endoscopy;  Laterality: N/A;   CYSTOSCOPY N/A 07/01/2022   Procedure: CYSTOSCOPY;  Surgeon: Marilynne Rosaline SAILOR, MD;  Location: [facility];  Service: Gynecology;  Laterality: N/A;   CYSTOSCOPY/URETEROSCOPY/HOLMIUM LASER/STENT PLACEMENT Left 12/02/2023   Procedure: CYSTOSCOPY/URETEROSCOPY/HOLMIUM LASER/STENT PLACEMENT;  Surgeon: Twylla Glendia BROCKS, MD;  Location: [facility];  Service: Urology;  Laterality: Left;   PERINEOPLASTY N/A 07/01/2022   Procedure: PERINEOPLASTY;  Surgeon: Marilynne Rosaline SAILOR, MD;  Location: [facility];  Service: Gynecology;  Laterality: N/A;   RIGHT OOPHORECTOMY     pt not sure if her ovary was removed or just her tube   TUBAL LIGATION     XI ROBOTIC ASSISTED TOTAL HYSTERECTOMY WITH SACROCOLPOPEXY N/A 07/01/2022   Procedure: XI ROBOTIC ASSISTED TOTAL HYSTERECTOMY WITH BILATERAL SALPINGO-OOPHORECTOMY AND SACROCOLPOPEXY;  Surgeon: Marilynne Rosaline SAILOR, MD;  Location: [facility];  Service: Gynecology;  Laterality: N/A;  total time requested is 3.5 hours    OB History     Gravida  3   Para      Term  Preterm      AB      Living  2      SAB      IAB      Ectopic      Multiple      Live Births  3            Home Medications    Prior to Admission medications   Medication Sig Start Date End Date Taking? Authorizing Provider  phenazopyridine  (PYRIDIUM ) 200 MG tablet Take 1 tablet (200 mg total) by mouth 3 (three) times daily. 12/12/23  Yes Bernardino Ditch, NP   sulfamethoxazole -trimethoprim  (BACTRIM  DS) 800-160 MG tablet Take 1 tablet by mouth 2 (two) times daily for 7 days. 12/12/23 12/19/23 Yes Bernardino Ditch, NP  calcium carbonate (TUMS EX) 750 MG chewable tablet Chew 1 tablet by mouth as needed for heartburn.    [provider]  diphenhydrAMINE  (BENADRYL ) 25 MG tablet Take 25 mg by mouth every 6 (six) hours as needed for itching (When take oxycodone ).    [provider]  ibuprofen  (ADVIL ) 200 MG tablet Take 400 mg by mouth every 6 (six) hours as needed for mild pain (pain score 1-3) or fever.    [provider]  Magnesium 400 MG TABS Take 400 mg by mouth daily.    [provider]  oxybutynin  (DITROPAN ) 5 MG tablet 1 tab tid prn frequency,urgency, bladder spasm 12/08/23   Stoioff, Glendia BROCKS, MD  oxyCODONE  (ROXICODONE ) 5 MG immediate release tablet Take 1 tablet (5 mg total) by mouth every 8 (eight) hours as needed. Patient not taking: Reported on 12/01/2023 11/23/23 11/22/24  Cyrena Mylar, MD  oxyCODONE  (ROXICODONE ) 5 MG immediate release tablet Take 1 tablet (5 mg total) by mouth every 8 (eight) hours as needed for up to 8 doses. 11/28/23   Cyrena Mylar, MD  pantoprazole  (PROTONIX ) 40 MG tablet TAKE 1 TABLET BY MOUTH TWICE A DAY 12/02/23   Justus Leita DEL, MD  Potassium 99 MG TABS Take 99 mg by mouth daily.    [provider]  tamsulosin  (FLOMAX ) 0.4 MG CAPS capsule Take 1 capsule (0.4 mg total) by mouth daily. 11/23/23 12/23/23  Cyrena Mylar, MD  UNABLE TO FIND Take 1-2 drops by mouth daily. Med Name: Liquid Iodine     [provider]  VITAMIN D -VITAMIN K PO Take 1 tablet by mouth daily.    [provider]    Family History Family History  Problem Relation Age of Onset   Dementia Mother    CAD Father    AAA (abdominal aortic aneurysm) Father    Thyroid  nodules Father    AAA (abdominal aortic aneurysm) Brother    Thyroid  nodules Paternal Grandmother    Colon cancer Paternal Grandfather 41   Breast  cancer Neg Hx     Social History Social History   Tobacco Use   Smoking status: Never   Smokeless tobacco: Never  Vaping Use   Vaping status: Never Used  Substance Use Topics   Alcohol use: Not Currently    Comment: occasional, about one drink once a month   Drug use: No     Allergies   Augmentin  [amoxicillin -pot clavulanate], Morphine , and Oxycodone    Review of Systems Review of Systems  Constitutional:  Negative for fever.  Gastrointestinal:  Negative for abdominal pain, nausea and vomiting.  Genitourinary:  Positive for dysuria, frequency and urgency. Negative for hematuria.  Musculoskeletal:  Negative for back pain.     Physical Exam Triage Vital  Signs ED Triage Vitals  Encounter Vitals Group     BP      Girls Systolic BP Percentile      Girls Diastolic BP Percentile      Boys Systolic BP Percentile      Boys Diastolic BP Percentile      Pulse      Resp      Temp      Temp src      SpO2      Weight      Height      Head Circumference      Peak Flow      Pain Score      Pain Loc      Pain Education      Exclude from Growth Chart    No data found.  Updated Vital Signs BP 116/81 (BP Location: Left Arm)   Pulse 100   Temp 98.1 F (36.7 C) (Oral)   Resp 14   Ht 5' 5 (1.651 m)   Wt 169 lb 15.6 oz (77.1 kg)   SpO2 96%   BMI 28.29 kg/m   Visual Acuity Right Eye Distance:   Left Eye Distance:   Bilateral Distance:    Right Eye Near:   Left Eye Near:    Bilateral Near:     Physical Exam Vitals and nursing note reviewed.  Constitutional:      Appearance: Normal appearance. She is not ill-appearing.  HENT:     Head: Normocephalic and atraumatic.  Cardiovascular:     Rate and Rhythm: Normal rate and regular rhythm.     Pulses: Normal pulses.     Heart sounds: Normal heart sounds. No murmur heard.    No friction rub. No gallop.  Pulmonary:     Effort: Pulmonary effort is normal.     Breath sounds: Normal breath sounds. No wheezing,  rhonchi or rales.  Abdominal:     Palpations: Abdomen is soft.     Tenderness: There is abdominal tenderness. There is no right CVA tenderness, left CVA tenderness, guarding or rebound.     Comments: Mild suprapubic tenderness.  Remainder the abdomen is benign.  No CVA tenderness.  Skin:    General: Skin is warm and dry.     Capillary Refill: Capillary refill takes less than 2 seconds.     Findings: No rash.  Neurological:     General: No focal deficit present.     Mental Status: She is alert and oriented to person, place, and time.      UC Treatments / Results  Labs (all labs ordered are listed, but only abnormal results are displayed) Labs Reviewed  URINALYSIS, W/ REFLEX TO CULTURE (INFECTION SUSPECTED) - Abnormal; Notable for the following components:      Result Value   Color, Urine ORANGE (*)    APPearance CLOUDY (*)    Glucose, UA   (*)    Value: TEST NOT REPORTED DUE TO COLOR INTERFERENCE OF URINE PIGMENT   Hgb urine dipstick   (*)    Value: TEST NOT REPORTED DUE TO COLOR INTERFERENCE OF URINE PIGMENT   Bilirubin Urine   (*)    Value: TEST NOT REPORTED DUE TO COLOR INTERFERENCE OF URINE PIGMENT   Ketones, ur   (*)    Value: TEST NOT REPORTED DUE TO COLOR INTERFERENCE OF URINE PIGMENT   Protein, ur   (*)    Value: TEST NOT REPORTED DUE TO COLOR INTERFERENCE OF URINE PIGMENT  Nitrite   (*)    Value: TEST NOT REPORTED DUE TO COLOR INTERFERENCE OF URINE PIGMENT   Leukocytes,Ua   (*)    Value: TEST NOT REPORTED DUE TO COLOR INTERFERENCE OF URINE PIGMENT   Bacteria, UA MANY (*)    All other components within normal limits  URINE CULTURE    EKG   Radiology No results found.  Procedures Procedures (including critical care time)  Medications Ordered in UC Medications - No data to display  Initial Impression / Assessment and Plan / UC Course  I have reviewed the triage vital signs and the nursing notes.  Pertinent labs & imaging results that were available  during my care of the patient were reviewed by me and considered in my medical decision making (see chart for details).   Patient is a nontoxic-appearing 63 year old female presenting for evaluation of UTI symptoms in the setting of a recent surgical stone removal 10 days ago by urology.  She reports that she has not called urology regarding her symptoms.  She has been taking Tylenol , oxycodone , Flomax , and Ditropan .  She started taking Azo last night when her symptoms returned and force.  She initially had symptoms on 4 July and went to the ER but left before being seen.  She did give a urinalysis prior to leaving which showed large hemoglobin, small leukocyte esterase, 30 protein.  Reflex micro did not see any bacteria, mucus was present, >50 RBCs, and 21-50 WBCs.  Urine was not sent for culture.  In the exam room she is not in any acute distress and she has no CVA tenderness on exam.  She does have mild suprapubic tenderness.  She reports she feels like she has to push on her bladder because she feels like she is not completely emptying.  I will order a urinalysis to assess for the presence of UTI.  Urine dip was not reported due to colorimetric interference from the Azo.  The urine had a cloudy appearance.  Microscopic evaluation showed >50 WBCs and >50 RBCs with many bacteria.  Urine will reflex to culture.  I will discharge patient home on a 7-day course of Bactrim  DS.  She should take it twice a day with a full glass of water .  She should continue the Azo for urinary discomfort.  If we need to make adjustments to the antibiotic based on the culture we will call and let her know.   Final Clinical Impressions(s) / UC Diagnoses   Final diagnoses:  Lower urinary tract infectious disease     Discharge Instructions      Take the Bactrim  DS twice daily for 7 days with food for treatment of urinary tract infection.  Use the Pyridium  every 8 hours as needed for urinary discomfort.  This will turn  your urine a bright red-orange.  Increase your oral fluid intake so that you increase your urine production and or flushing your urinary system.  Take an over-the-counter probiotic, such as Culturelle-Align-Activia, 1 hour after each dose of antibiotic to prevent diarrhea or yeast infections from forming.  We will culture urine and change the antibiotics if necessary.  Return for reevaluation, or see your primary care provider, for any new or worsening symptoms.      ED Prescriptions     Medication Sig Dispense Auth. Provider   sulfamethoxazole -trimethoprim  (BACTRIM  DS) 800-160 MG tablet Take 1 tablet by mouth 2 (two) times daily for 7 days. 14 tablet Bernardino Ditch, NP   phenazopyridine  (PYRIDIUM ) 200 MG  tablet Take 1 tablet (200 mg total) by mouth 3 (three) times daily. 6 tablet Bernardino Ditch, NP      PDMP not reviewed this encounter.   Bernardino Ditch, NP 12/12/23 [phone]

## 2023-12-12 NOTE — Discharge Instructions (Addendum)
 Take the Bactrim DS twice daily for 7 days with food for treatment of urinary tract infection.  Use the Pyridium every 8 hours as needed for urinary discomfort.  This will turn your urine a bright red-orange.  Increase your oral fluid intake so that you increase your urine production and or flushing your urinary system.  Take an over-the-counter probiotic, such as Culturelle-Align-Activia, 1 hour after each dose of antibiotic to prevent diarrhea or yeast infections from forming.  We will culture urine and change the antibiotics if necessary.  Return for reevaluation, or see your primary care provider, for any new or worsening symptoms.

## 2023-12-12 NOTE — Telephone Encounter (Signed)
 Patient left message on triage line asking if it was ok to take oxybutynin ,and Flomax  with the antibiotics prescribed by ER. Spoke with Clotilda to ask and she said it was fine to take all three together. I called patient and let her know this was acceptable.

## 2023-12-14 LAB — URINE CULTURE: Culture: >=100000 — AB

## 2023-12-15 ENCOUNTER — Ambulatory Visit (HOSPITAL_COMMUNITY): Payer: Self-pay

## 2023-12-15 LAB — STONE ANALYSIS
Calcium Oxalate Dihydrate: 20 %
Calcium Oxalate Monohydrate: 80 %
Weight Calculi: 25 mg

## 2023-12-23 NOTE — Telephone Encounter (Signed)
 error

## 2024-01-06 ENCOUNTER — Encounter: Payer: Self-pay | Admitting: Urology

## 2024-01-06 ENCOUNTER — Ambulatory Visit: Admitting: Urology

## 2024-01-06 VITALS — BP 119/78 | HR 71 | Ht 65.5 in | Wt 165.0 lb

## 2024-01-06 DIAGNOSIS — R109 Unspecified abdominal pain: Secondary | ICD-10-CM

## 2024-01-06 DIAGNOSIS — N200 Calculus of kidney: Secondary | ICD-10-CM

## 2024-01-06 NOTE — Patient Instructions (Addendum)
 Scheduling number for ultrasound [phone]  [facility] Instructions [facility]   You will receive a box/kit in the mail that will have a urine jug and instructions in the kit.  When the box arrives you will need to call our office [phone] to schedule a LAB appointment.   You will need to do a 24hour urine and this should be done during the days that our office will be open.  For example any day from Sunday through Thursday.   If you take Vitamin C 100mg  or greater please stop this 5 days prior to collection.   How to collect the urine sample: On the day you start the urine sample this 1st morning urine should NOT be collected.  For the rest of the day including all night urines should be collected.  On the next morning the 1st urine should be collected and then you will be finished with the urine collections.   You will need to bring the box with you on your LAB appointment day after urine has been collected and all instructions are complete in the box.  Your blood will be drawn and the box will be collected by our Lab employee to be sent off for analysis.   When urine and blood is complete you will need to schedule a follow up appointment for lab results.

## 2024-01-06 NOTE — Progress Notes (Signed)
 01/06/2024 8:39 AM   Madeline Moon Dec 12, 1960 [id number]  Referring provider: Justus Leita DEL, MD [address] [place],  [place] [address]  Chief Complaint  Patient presents with   Follow-up    1 month f/u from surg    Urologic history: 1.  Recurrent nephrolithiasis SWL ureteral calculus early 2000 and subsequent ureteroscopy secondary to incomplete fragmentation CT 11/2023 for renal colic with small, bilateral renal calculi and a 5 mm obstructing left proximal ureteral calculus Ureteroscopic stone removal 12/02/2023 Stone analysis mixed calcium oxalate: 80/20   HPI: Madeline Moon is a 63 y.o. female presents for postop follow-up.  She removed her stent 3 days postop and since that time has had left flank pain which only occurs when lying on the area or putting pressure on the flank.  Pain rated mild to moderate No bothersome LUTS Stone analysis: CaOxMono/CaOxDi 80/20   PMH: Past Medical History:  Diagnosis Date   COVID-19 05/15/2021   also had covid previously, pt unsure of date   Depression, major, single episode, complete remission (HCC) 12/16/2014   only for a few months, then completely resolved   GERD (gastroesophageal reflux disease)    History of kidney stones    around 2014   History of prediabetes    09/19/21 Hgb A1c improved to 5.5   Pleurisy 2022   Pneumonia 06/07/2020   pneumonia after having Covid, see 06/07/20 chest xray in [facility]where   Renal disorder    kidney stones   Shoulder pain, right 2021   resolved per pt   Vertigo    occasional dizziness   Wears glasses     Surgical History: Past Surgical History:  Procedure Laterality Date   BLADDER SUSPENSION N/A 07/01/2022   Procedure: TRANSVAGINAL TAPE (TVT) PROCEDURE;  Surgeon: Marilynne Rosaline SAILOR, MD;  Location: [facility];  Service: Gynecology;  Laterality: N/A;   COLONOSCOPY  2013   COLONOSCOPY  2011   COLONOSCOPY WITH PROPOFOL  N/A 10/08/2021    Procedure: COLONOSCOPY WITH PROPOFOL ;  Surgeon: Jinny Carmine, MD;  Location: [facility];  Service: Endoscopy;  Laterality: N/A;   CYSTOSCOPY N/A 07/01/2022   Procedure: CYSTOSCOPY;  Surgeon: Marilynne Rosaline SAILOR, MD;  Location: [facility];  Service: Gynecology;  Laterality: N/A;   CYSTOSCOPY/URETEROSCOPY/HOLMIUM LASER/STENT PLACEMENT Left 12/02/2023   Procedure: CYSTOSCOPY/URETEROSCOPY/HOLMIUM LASER/STENT PLACEMENT;  Surgeon: Twylla Glendia BROCKS, MD;  Location: [facility];  Service: Urology;  Laterality: Left;   PERINEOPLASTY N/A 07/01/2022   Procedure: PERINEOPLASTY;  Surgeon: Marilynne Rosaline SAILOR, MD;  Location: [facility];  Service: Gynecology;  Laterality: N/A;   RIGHT OOPHORECTOMY     pt not sure if her ovary was removed or just her tube   TUBAL LIGATION     XI ROBOTIC ASSISTED TOTAL HYSTERECTOMY WITH SACROCOLPOPEXY N/A 07/01/2022   Procedure: XI ROBOTIC ASSISTED TOTAL HYSTERECTOMY WITH BILATERAL SALPINGO-OOPHORECTOMY AND SACROCOLPOPEXY;  Surgeon: Marilynne Rosaline SAILOR, MD;  Location: [facility];  Service: Gynecology;  Laterality: N/A;  total time requested is 3.5 hours    Home Medications:  Allergies as of 01/06/2024       Reactions   Augmentin  [amoxicillin -pot Clavulanate] Rash   Morphine  Itching   Nose itching and burning. Patient stated she would take Morphine  if she really needed it.   Oxycodone  Itching        Medication List        Accurate as of January 06, 2024  8:39 AM. If you have  any questions, ask your nurse or doctor.          calcium carbonate 750 MG chewable tablet Commonly known as: TUMS EX Chew 1 tablet by mouth as needed for heartburn.   diphenhydrAMINE  25 MG tablet Commonly known as: BENADRYL  Take 25 mg by mouth every 6 (six) hours as needed for itching (When take oxycodone ).   ibuprofen  200 MG tablet Commonly known as: ADVIL  Take 400 mg by mouth every 6 (six) hours as needed for mild pain (pain score  1-3) or fever.   Magnesium 400 MG Tabs Take 400 mg by mouth daily.   oxybutynin  5 MG tablet Commonly known as: DITROPAN  1 tab tid prn frequency,urgency, bladder spasm   oxyCODONE  5 MG immediate release tablet Commonly known as: Roxicodone  Take 1 tablet (5 mg total) by mouth every 8 (eight) hours as needed.   oxyCODONE  5 MG immediate release tablet Commonly known as: Roxicodone  Take 1 tablet (5 mg total) by mouth every 8 (eight) hours as needed for up to 8 doses.   pantoprazole  40 MG tablet Commonly known as: PROTONIX  TAKE 1 TABLET BY MOUTH TWICE A DAY   phenazopyridine  200 MG tablet Commonly known as: PYRIDIUM  Take 1 tablet (200 mg total) by mouth 3 (three) times daily.   Potassium 99 MG Tabs Take 99 mg by mouth daily.   UNABLE TO FIND Take 1-2 drops by mouth daily. Med Name: Liquid Iodine    VITAMIN D -VITAMIN K PO Take 1 tablet by mouth daily.        Allergies:  Allergies  Allergen Reactions   Augmentin  [Amoxicillin -Pot Clavulanate] Rash   Morphine  Itching    Nose itching and burning. Patient stated she would take Morphine  if she really needed it.   Oxycodone  Itching    Family History: Family History  Problem Relation Age of Onset   Dementia Mother    CAD Father    AAA (abdominal aortic aneurysm) Father    Thyroid  nodules Father    AAA (abdominal aortic aneurysm) Brother    Thyroid  nodules Paternal Grandmother    Colon cancer Paternal Grandfather 77   Breast cancer Neg Hx     Social History:  reports that she has never smoked. She has never used smokeless tobacco. She reports that she does not currently use alcohol. She reports that she does not use drugs.   Physical Exam: BP 119/78   Pulse 71   Ht 5' 5.5 (1.664 m)   Wt 165 lb (74.8 kg)   BMI 27.04 kg/m   Constitutional:  Alert and oriented, No acute distress. HEENT: [place] AT Respiratory: Normal respiratory effort, no increased work of breathing. Psychiatric: Normal mood and  affect.    Assessment & Plan:    1. Flank pain Postop left flank pain Schedule follow-up renal ultrasound  2.  Bilateral nephrolithiasis Recommend metabolic evaluation/Litholink   [name], MD  [facility] [address] [place], [place] [address] [phone]

## 2024-01-09 ENCOUNTER — Ambulatory Visit
Admission: RE | Admit: 2024-01-09 | Discharge: 2024-01-09 | Disposition: A | Discharge status: Home / Self Care | Source: Ambulatory Visit | Attending: Urology | Admitting: Urology

## 2024-01-09 DIAGNOSIS — R109 Unspecified abdominal pain: Secondary | ICD-10-CM | POA: Insufficient documentation

## 2024-01-13 ENCOUNTER — Other Ambulatory Visit

## 2024-01-13 DIAGNOSIS — N200 Calculus of kidney: Secondary | ICD-10-CM

## 2024-01-14 LAB — LITHOLINK SERUM PANEL
CO2: 19 mmol/L — ABNORMAL LOW (ref 20–29)
Calcium: 9.1 mg/dL (ref 8.7–10.3)
Chloride: 104 mmol/L (ref 96–106)
Creatinine, Ser: 1 mg/dL (ref 0.57–1.00)
Magnesium: 2.4 mg/dL — ABNORMAL HIGH (ref 1.6–2.3)
Phosphorus: 3.3 mg/dL (ref 3.0–4.3)
Potassium: 5.1 mmol/L (ref 3.5–5.2)
Sodium: 144 mmol/L (ref 134–144)
Uric Acid: 4.7 mg/dL (ref 3.0–7.2)
eGFR: 63 mL/min/1.73 (ref >59)

## 2024-01-20 LAB — LITHOLINK 24HR URINE PANEL
Ammonium, Urine: 33 mmol/(24.h) (ref 15–60)
Calcium Oxalate Saturation: 3.26 — ABNORMAL LOW (ref 6.00–10.00)
Calcium Phosphate Saturation: 0.36 — ABNORMAL LOW (ref 0.50–2.00)
Calcium, Urine: 315 mg/(24.h) — ABNORMAL HIGH (ref <200)
Calcium/Creatinine Ratio: 180 mg/g{creat} (ref 51–262)
Calcium/Kg Body Weight: 4.2 mg/kg/d — ABNORMAL HIGH (ref <4.0)
Chloride, Urine: 76 mmol/(24.h) (ref 70–250)
Citrate, Urine: 1283 mg/(24.h) (ref >550)
Creatinine, Urine: 1753 mg/(24.h)
Creatinine/Kg Body Weight: 23.4 mg/kg/d — ABNORMAL HIGH (ref 8.7–20.3)
Cystine, Urine, Qualitative: NEGATIVE
Magnesium, Urine: 61 mg/(24.h) (ref 30–120)
Oxalate, Urine: 15 mg/(24.h) — ABNORMAL LOW (ref 20–40)
Phosphorus, Urine: 948 mg/(24.h) (ref 600–1200)
Potassium, Urine: 58 mmol/(24.h) (ref 20–100)
Protein Catabolic Rate: 1.3 g/kg/d (ref 0.8–1.4)
Sodium, Urine: 93 mmol/(24.h) (ref 50–150)
Sulfate, Urine: 51 meq/(24.h) (ref 20–80)
Urea Nitrogen, Urine: 13.64 g/(24.h) (ref 6.00–14.00)
Uric Acid Saturation: 1.47 — ABNORMAL HIGH (ref <1.00)
Uric Acid, Urine: 778 mg/(24.h) — ABNORMAL HIGH (ref <750)
Urine Volume (Preserved): 2500 mL/(24.h) (ref 500–4000)
pH, 24 hr, Urine: 5.495 — ABNORMAL LOW (ref 5.800–6.200)

## 2024-01-24 ENCOUNTER — Ambulatory Visit: Payer: Self-pay | Admitting: Urology

## 2024-01-27 ENCOUNTER — Encounter: Payer: Self-pay | Admitting: Internal Medicine

## 2024-01-30 ENCOUNTER — Encounter: Payer: Self-pay | Admitting: Pediatrics

## 2024-02-03 NOTE — Telephone Encounter (Signed)
 PT called asking about litholink results, she has heard anything.

## 2024-03-02 NOTE — Telephone Encounter (Signed)
 Pt called the triage line asking about litholink and ultrasound results. Please advise.

## 2024-03-03 HISTORY — PX: ESOPHAGOGASTRODUODENOSCOPY: SHX1529

## 2024-03-10 ENCOUNTER — Encounter: Payer: Self-pay | Admitting: Urology

## 2024-03-10 ENCOUNTER — Ambulatory Visit (INDEPENDENT_AMBULATORY_CARE_PROVIDER_SITE_OTHER): Admitting: Urology

## 2024-03-10 VITALS — BP 138/85 | HR 76 | Ht 65.0 in | Wt 170.0 lb

## 2024-03-10 DIAGNOSIS — N200 Calculus of kidney: Secondary | ICD-10-CM

## 2024-03-10 NOTE — Progress Notes (Signed)
 03/10/2024 11:09 AM   Madeline Moon 09-28-1960 [id number]  Referring provider: Justus Leita DEL, MD [address] [place],  [place] [address]  Chief Complaint  Patient presents with   Results   Urologic history: 1.  Recurrent nephrolithiasis SWL ureteral calculus early 2000 and subsequent ureteroscopy secondary to incomplete fragmentation CT 11/2023 for renal colic with small, bilateral renal calculi and a 5 mm obstructing left proximal ureteral calculus Ureteroscopic stone removal 12/02/2023 Stone analysis mixed calcium oxalate: 80/20  HPI: Madeline Moon is a 63 y.o. female presents for follow-up.  On the 01/06/2024 visit she was complaining of left flank discomfort which occurred with any pressure placed on the left flank region but otherwise had no pain RUS was ordered which showed no hydronephrosis or parenchymal abnormalities This discomfort has persisted 24-hour urine study remarkable for excellent urine output at 2.5 L.  She had hypercalciuria at 350 mg however her calcium oxalate supersaturation was low at 3.26.  Her urine sodium level was normal.  Urine pH was low at 5.5. She does use calcium carbonate antacids-750 mg tablets and typically takes 2-3/day and sometimes up to 6   PMH: Past Medical History:  Diagnosis Date   COVID-19 05/15/2021   also had covid previously, pt unsure of date   Depression, major, single episode, complete remission 12/16/2014   only for a few months, then completely resolved   GERD (gastroesophageal reflux disease)    History of kidney stones    around 2014   History of prediabetes    09/19/21 Hgb A1c improved to 5.5   Pleurisy 2022   Pneumonia 06/07/2020   pneumonia after having Covid, see 06/07/20 chest xray in [facility]where   Renal disorder    kidney stones   Shoulder pain, right 2021   resolved per pt   Vertigo    occasional dizziness   Wears glasses     Surgical History: Past Surgical History:   Procedure Laterality Date   BLADDER SUSPENSION N/A 07/01/2022   Procedure: TRANSVAGINAL TAPE (TVT) PROCEDURE;  Surgeon: Marilynne Rosaline SAILOR, MD;  Location: [facility];  Service: Gynecology;  Laterality: N/A;   COLONOSCOPY  2013   COLONOSCOPY  2011   COLONOSCOPY WITH PROPOFOL  N/A 10/08/2021   Procedure: COLONOSCOPY WITH PROPOFOL ;  Surgeon: Jinny Carmine, MD;  Location: [facility];  Service: Endoscopy;  Laterality: N/A;   CYSTOSCOPY N/A 07/01/2022   Procedure: CYSTOSCOPY;  Surgeon: Marilynne Rosaline SAILOR, MD;  Location: [facility];  Service: Gynecology;  Laterality: N/A;   CYSTOSCOPY/URETEROSCOPY/HOLMIUM LASER/STENT PLACEMENT Left 12/02/2023   Procedure: CYSTOSCOPY/URETEROSCOPY/HOLMIUM LASER/STENT PLACEMENT;  Surgeon: Twylla Glendia BROCKS, MD;  Location: [facility];  Service: Urology;  Laterality: Left;   PERINEOPLASTY N/A 07/01/2022   Procedure: PERINEOPLASTY;  Surgeon: Marilynne Rosaline SAILOR, MD;  Location: [facility];  Service: Gynecology;  Laterality: N/A;   RIGHT OOPHORECTOMY     pt not sure if her ovary was removed or just her tube   TUBAL LIGATION     XI ROBOTIC ASSISTED TOTAL HYSTERECTOMY WITH SACROCOLPOPEXY N/A 07/01/2022   Procedure: XI ROBOTIC ASSISTED TOTAL HYSTERECTOMY WITH BILATERAL SALPINGO-OOPHORECTOMY AND SACROCOLPOPEXY;  Surgeon: Marilynne Rosaline SAILOR, MD;  Location: [facility];  Service: Gynecology;  Laterality: N/A;  total time requested is 3.5 hours    Home Medications:  Allergies as of 03/10/2024       Reactions   Augmentin  [amoxicillin -pot Clavulanate] Rash   Morphine  Itching   Nose itching and  burning. Patient stated she would take Morphine  if she really needed it.   Oxycodone  Itching        Medication List        Accurate as of March 10, 2024 11:09 AM. If you have any questions, ask your nurse or doctor.          calcium carbonate 750 MG chewable tablet Commonly known as: TUMS EX Chew 1  tablet by mouth as needed for heartburn.   diphenhydrAMINE  25 MG tablet Commonly known as: BENADRYL  Take 25 mg by mouth every 6 (six) hours as needed for itching (When take oxycodone ).   ibuprofen  200 MG tablet Commonly known as: ADVIL  Take 400 mg by mouth every 6 (six) hours as needed for mild pain (pain score 1-3) or fever.   Magnesium 400 MG Tabs Take 400 mg by mouth daily.   oxybutynin  5 MG tablet Commonly known as: DITROPAN  1 tab tid prn frequency,urgency, bladder spasm   oxyCODONE  5 MG immediate release tablet Commonly known as: Roxicodone  Take 1 tablet (5 mg total) by mouth every 8 (eight) hours as needed for up to 8 doses. What changed: Another medication with the same name was removed. Continue taking this medication, and follow the directions you see here.   pantoprazole  40 MG tablet Commonly known as: PROTONIX  TAKE 1 TABLET BY MOUTH TWICE A DAY   phenazopyridine  200 MG tablet Commonly known as: PYRIDIUM  Take 1 tablet (200 mg total) by mouth 3 (three) times daily.   Potassium 99 MG Tabs Take 99 mg by mouth daily.   UNABLE TO FIND Take 1-2 drops by mouth daily. Med Name: Liquid Iodine    VITAMIN D -VITAMIN K PO Take 1 tablet by mouth daily.        Allergies:  Allergies  Allergen Reactions   Augmentin  [Amoxicillin -Pot Clavulanate] Rash   Morphine  Itching    Nose itching and burning. Patient stated she would take Morphine  if she really needed it.   Oxycodone  Itching    Family History: Family History  Problem Relation Age of Onset   Dementia Mother    CAD Father    AAA (abdominal aortic aneurysm) Father    Thyroid  nodules Father    AAA (abdominal aortic aneurysm) Brother    Thyroid  nodules Paternal Grandmother    Colon cancer Paternal Grandfather 27   Breast cancer Neg Hx     Social History:  reports that she has never smoked. She has never used smokeless tobacco. She reports that she does not currently use alcohol. She reports that she does not  use drugs.   Physical Exam: BP 138/85   Pulse 76   Ht 5' 5 (1.651 m)   Wt 170 lb (77.1 kg)   BMI 28.29 kg/m   Constitutional:  Alert, No acute distress. HEENT: [place] AT Respiratory: Normal respiratory effort, no increased work of breathing. Psychiatric: Normal mood and affect.   Assessment & Plan:    Bilateral nephrolithiasis Follow-up KUB 6 months Will contact with results  2.  Hypercalciuria May be dietary related with her calcium carbonate antacid intake and recommend she switch to a magnesium based antacid.  She has a GI follow-up in the near future Her urine volume was excellent with a low calcium oxalate supersaturation and would not recommend thiazide diuretic therapy at this time  3.  Flank pain Negative renal ultrasound Most likely musculoskeletal Call for worsening pain   [name], MD  [facility] [place] [address]  [place], [place] [address] [phone]

## 2024-03-22 NOTE — Progress Notes (Unsigned)
 [facility] Initial Consultation   Referring Provider Justus Leita DEL, MD [address] [place],  [place] [address]  Primary Care Provider Justus Leita DEL, MD  Patient Profile: Madeline Moon is a 63 y.o. female who is seen in consultation in the [facility] at the request of Dr. Justus for evaluation and management of the problem(s) noted below.  Problem List: GERD Heartburn Dysphagia History of gallbladder polyp Family history of colon cancer-paternal grandfather diagnosed less than age 98   History of Present Illness     Discussed the use of AI scribe software for clinical note transcription with the patient, who gave verbal consent to proceed.  History of Present Illness Madeline Moon is a 62 year old woman with a past medical history noteworthy for prediabetes, elevated blood pressure, HLD, osteoarthritis, nephrolithiasis who presents to the gastroenterology office for evaluation of GERD, heartburn and dysphagia  GERD/dysphagia - Progressive difficulty swallowing over the past ten years - Solid foods cause throat irritation and sensation of food sticking in the throat - Requires liquids to aid in swallowing solid foods - Associated with pain during swallowing - Suspects an ulcer in the throat  - Chronic acid reflux with acid regurgitation and unpleasant taste in the mouth, especially after sleeping - Sleeping on an incline improves symptoms - Pantoprazole  recently started; effective when taken correctly but does not fully resolve swallowing difficulties - Taking pantoprazole  twice daily- Does not always take it time before meal and sometimes forgets to take the medication - Occasional use of Tums for breakthrough symptoms, particularly after spicy or sweet foods - No epigastric pain, nausea or vomiting - No history of peptic ulcer disease or H. pylori  History of gallbladder polyp - AUS 2011 showed a 2.4 mm gallbladder  polyp -no follow-up ultrasound obtained - No family history of hepatobiliary malignancy - Discussed obtaining updated ultrasound  Colorectal cancer screening - Last colonoscopy 2023 normal -10-year follow-up advised - Colonoscopy 2013 normal - Family history of colorectal cancer-paternal grandfather with colorectal cancer diagnosed less than age 59  GI Review of Symptoms Significant for GERD, dysphagia. Otherwise negative.  General Review of Systems  Review of systems is significant for the pertinent positives and negatives as listed per the HPI.  Full ROS is otherwise negative.  Past Medical History   Past Medical History:  Diagnosis Date   COVID-19 05/15/2021   also had covid previously, pt unsure of date   Depression, major, single episode, complete remission 12/16/2014   only for a few months, then completely resolved   GERD (gastroesophageal reflux disease)    History of kidney stones    around 2014   History of prediabetes    09/19/21 Hgb A1c improved to 5.5   Pleurisy 2022   Pneumonia 06/07/2020   pneumonia after having Covid, see 06/07/20 chest xray in Care Everywhere   Renal disorder    kidney stones   Shoulder pain, right 2021   resolved per pt   Vertigo    occasional dizziness   Wears glasses      Past Surgical History   Past Surgical History:  Procedure Laterality Date   BLADDER SUSPENSION N/A 07/01/2022   Procedure: TRANSVAGINAL TAPE (TVT) PROCEDURE;  Surgeon: Marilynne Rosaline SAILOR, MD;  Location: [facility] ;  Service: Gynecology;  Laterality: N/A;   COLONOSCOPY  2013   COLONOSCOPY  2011   COLONOSCOPY WITH PROPOFOL  N/A 10/08/2021   Procedure: COLONOSCOPY WITH PROPOFOL ;  Surgeon: Jinny Carmine, MD;  Location: [facility];  Service: Endoscopy;  Laterality: N/A;   CYSTOSCOPY N/A 07/01/2022   Procedure: CYSTOSCOPY;  Surgeon: Marilynne Rosaline SAILOR, MD;  Location: [facility];  Service: Gynecology;  Laterality: N/A;    CYSTOSCOPY/URETEROSCOPY/HOLMIUM LASER/STENT PLACEMENT Left 12/02/2023   Procedure: CYSTOSCOPY/URETEROSCOPY/HOLMIUM LASER/STENT PLACEMENT;  Surgeon: Twylla Glendia BROCKS, MD;  Location: [facility];  Service: Urology;  Laterality: Left;   PERINEOPLASTY N/A 07/01/2022   Procedure: PERINEOPLASTY;  Surgeon: Marilynne Rosaline SAILOR, MD;  Location: [facility];  Service: Gynecology;  Laterality: N/A;   RIGHT OOPHORECTOMY     pt not sure if her ovary was removed or just her tube   TUBAL LIGATION     XI ROBOTIC ASSISTED TOTAL HYSTERECTOMY WITH SACROCOLPOPEXY N/A 07/01/2022   Procedure: XI ROBOTIC ASSISTED TOTAL HYSTERECTOMY WITH BILATERAL SALPINGO-OOPHORECTOMY AND SACROCOLPOPEXY;  Surgeon: Marilynne Rosaline SAILOR, MD;  Location: [facility];  Service: Gynecology;  Laterality: N/A;  total time requested is 3.5 hours     Allergies and Medications   Allergies  Allergen Reactions   Augmentin  [Amoxicillin -Pot Clavulanate] Rash   Morphine  Itching    Nose itching and burning. Patient stated she would take Morphine  if she really needed it.   Oxycodone  Itching   Current Meds  Medication Sig   calcium carbonate (TUMS EX) 750 MG chewable tablet Chew 1 tablet by mouth as needed for heartburn.   diphenhydrAMINE  (BENADRYL ) 25 MG tablet Take 25 mg by mouth every 6 (six) hours as needed for itching (When take oxycodone ).   ibuprofen  (ADVIL ) 200 MG tablet Take 400 mg by mouth every 6 (six) hours as needed for mild pain (pain score 1-3) or fever.   Magnesium 400 MG TABS Take 400 mg by mouth daily.   meloxicam  (MOBIC ) 15 MG tablet Take 15 mg by mouth daily.   pantoprazole  (PROTONIX ) 40 MG tablet TAKE 1 TABLET BY MOUTH TWICE A DAY   Potassium 99 MG TABS Take 99 mg by mouth daily.   SODIUM FLUORIDE 5000 PPM 1.1 % PSTE as directed.   VITAMIN D -VITAMIN K PO Take 1 tablet by mouth daily.     Family History   Family History  Problem Relation Age of Onset   Dementia Mother    CAD Father    AAA  (abdominal aortic aneurysm) Father    Thyroid  nodules Father    AAA (abdominal aortic aneurysm) Brother    Thyroid  nodules Paternal Grandmother    Colon cancer Paternal Grandfather 12   Breast cancer Neg Hx      Social History   Social History   Tobacco Use   Smoking status: Never   Smokeless tobacco: Never  Vaping Use   Vaping status: Never Used  Substance Use Topics   Alcohol use: Not Currently    Comment: occasional, about one drink once a month   Drug use: No   Shanieka reports that she has never smoked. She has never used smokeless tobacco. She reports that she does not currently use alcohol. She reports that she does not use drugs.  Vital Signs and Physical Examination   Vitals:   03/23/24 0849  BP: (!) 140/90  Pulse: 86   Body mass index is 28.83 kg/m. Weight: 173 lb 4 oz (78.6 kg)  General: Well developed, well nourished, no acute distress Head: Normocephalic and atraumatic Eyes: Sclerae anicteric, EOMI Lungs: Clear throughout to auscultation Heart: Regular rate and rhythm; No murmurs, rubs or bruits Abdomen: Soft, non tender and non distended. No masses, hepatosplenomegaly  or hernias noted. Normal Bowel sounds Rectal: Deferred Musculoskeletal: Symmetrical with no gross deformities  Pulses:  Normal pulses noted   Review of Data  The following data was reviewed at the time of this encounter:  Laboratory Studies      Latest Ref Rng & Units 12/05/2023    2:24 PM 11/28/2023    6:13 AM 11/23/2023    4:31 AM  CBC  WBC 4.0 - 10.5 K/uL 4.7  3.1  4.2   Hemoglobin 12.0 - 15.0 g/dL 85.9  85.9  85.5   Hematocrit 36.0 - 46.0 % 44.0  43.7  43.7   Platelets 150 - 400 K/uL 206  214  216     No results found for: LIPASE    Latest Ref Rng & Units 01/13/2024   11:30 AM 12/05/2023    2:24 PM 11/28/2023    6:13 AM  CMP  Glucose 70 - 99 mg/dL  844  897   BUN 8 - 23 mg/dL  22  28   Creatinine 9.42 - 1.00 mg/dL 8.99  9.03  9.18   Sodium 134 - 144 mmol/L 144  137  140    Potassium 3.5 - 5.2 mmol/L 5.1  3.7  4.1   Chloride 96 - 106 mmol/L 104  102  107   CO2 20 - 29 mmol/L 19  25  27    Calcium 8.7 - 10.3 mg/dL 9.1  8.9  8.9      Imaging Studies  AUS 01/16/2010 1. No gallstones are seen.  2. No ascites is noted.  3. There is a 4.2 mm polyp in the gallbladder.  4. There are observed two nonobstructive left renal stones.   GI Procedures and Studies  Colonoscopy 10/08/2021 (Dr. Jinny) Sigmoid diverticula Internal hemorrhoids  Colonoscopy 09/18/2011 (Dr. Ora) Normal  Colonoscopy 06/04/2009 Normal  Clinical Impression  It is my clinical impression that Ms. Madeline Moon is a 63 y.o. female with;  GERD Heartburn Dysphagia History of gallbladder polyp Family history of colon cancer-paternal grandfather diagnosed less than age 62  Madeline Moon presents to the office today merrily for evaluation of GERD and dysphagia.  Reports a longstanding history of reflux and swallowing difficulties which have progressively worsened prompting evaluation.  She has started on pantoprazole  40 mg p.o. twice daily which seems to ameliorate some of her symptoms of GERD but has not eradicated then.  She has not noticed substantial change in solid food dysphagia with the use of PPI.  Discussed that the differential diagnosis for her symptoms could include GERD, esophageal stricture, web, ring, diverticulum, EOE, malignancy, H. pylori infection.  Advise proceeding with upper endoscopy.  If negative can consider barium esophagram and/or esophageal manometry  Review of Madeline Moon's prior records includes an abdominal ultrasound in 2011 that disclosed to 2.4 mm gallbladder polyp.  Although diminutive in size, no follow-up ultrasound has been obtained and have advised a follow-up ultrasound today.  Madeline Moon is up-to-date for colorectal cancer screening.  She had normal colonoscopies in 2013 and 2023.  There is a family history of colorectal cancer in her paternal grandfather diagnosed before the age of 75.   She will be due in 10 years for her next colonoscopy.  Plan  GERD, heartburn, dysphagia        - Schedule EGD with potential dilation at [facility]        - Continue pantoprazole  40 mg p.o. twice daily x 20 to 30 minutes before meal        - GERD diet  and lifestyle modification  History of gallbladder polyp        - Schedule right upper quadrant ultrasound to follow-up gallbladder polyp  Colorectal cancer screening         - Next colonoscopy due 10/2031  Planned Follow Up 2-3 months  The patient or caregiver verbalized understanding of the material covered, with no barriers to understanding. All questions were answered. Patient or caregiver is agreeable with the plan outlined above.    It was a pleasure to see Madeline Moon.  If you have any questions or concerns regarding this evaluation, do not hesitate to contact me.  Inocente Hausen, MD [facility]   I spent total of 45 minutes in both face-to-face (25 minutes interview) and non-face-to-face (20 minutes chart review, care coordination, documentation)  activities, excluding procedures performed, for the visit on the date of this encounter.

## 2024-03-23 ENCOUNTER — Encounter: Payer: Self-pay | Admitting: Pediatrics

## 2024-03-23 ENCOUNTER — Ambulatory Visit: Admitting: Pediatrics

## 2024-03-23 VITALS — BP 140/90 | HR 86 | Ht 65.0 in | Wt 173.2 lb

## 2024-03-23 DIAGNOSIS — K219 Gastro-esophageal reflux disease without esophagitis: Secondary | ICD-10-CM

## 2024-03-23 DIAGNOSIS — R1310 Dysphagia, unspecified: Secondary | ICD-10-CM | POA: Diagnosis not present

## 2024-03-23 DIAGNOSIS — Z1211 Encounter for screening for malignant neoplasm of colon: Secondary | ICD-10-CM | POA: Diagnosis not present

## 2024-03-23 DIAGNOSIS — Z1212 Encounter for screening for malignant neoplasm of rectum: Secondary | ICD-10-CM

## 2024-03-23 DIAGNOSIS — K824 Cholesterolosis of gallbladder: Secondary | ICD-10-CM

## 2024-03-23 NOTE — Patient Instructions (Addendum)
 You have been scheduled for an abdominal ultrasound at [facility] (1st floor of hospital) on 03/31/24 at 10:00 am. Please arrive 30 minutes prior to your appointment for registration. Make certain not to have anything to eat or drink 6 hours prior to your appointment. Should you need to reschedule your appointment, please contact radiology at [phone]. This test typically takes about 30 minutes to perform.  You have been scheduled for an endoscopy. Please follow written instructions given to you at your visit today.  If you use inhalers (even only as needed), please bring them with you on the day of your procedure.  If you take any of the following medications, they will need to be adjusted prior to your procedure:   DO NOT TAKE 7 DAYS PRIOR TO TEST- Trulicity (dulaglutide) Ozempic, Wegovy (semaglutide) Mounjaro (tirzepatide) Bydureon Bcise (exanatide extended release)  DO NOT TAKE 1 DAY PRIOR TO YOUR TEST Rybelsus (semaglutide) Adlyxin (lixisenatide) Victoza (liraglutide) Byetta (exanatide) ___________________________________________________________________________  Thank you for entrusting me with your care and for choosing [facility], Dr. Inocente Hausen   _______________________________________________________  If your blood pressure at your visit was 140/90 or greater, please contact your primary care physician to follow up on this.  _______________________________________________________  If you are age 20 or older, your body mass index should be between 23-30. Your Body mass index is 28.83 kg/m. If this is out of the aforementioned range listed, please consider follow up with your Primary Care Provider.  If you are age 56 or younger, your body mass index should be between 19-25. Your Body mass index is 28.83 kg/m. If this is out of the aformentioned range listed, please consider follow up with your Primary Care Provider.    ________________________________________________________  The [facility] providers would like to encourage you to use MYCHART to communicate with providers for non-urgent requests or questions.  Due to long hold times on the telephone, sending your provider a message by [facility] may be a faster and more efficient way to get a response.  Please allow 48 business hours for a response.  Please remember that this is for non-urgent requests.  _______________________________________________________  [facility] is using a team-based approach to care.  Your team is made up of your doctor and two to three APPS. Our APPS (Nurse Practitioners and Physician Assistants) work with your physician to ensure care continuity for you. They are fully qualified to address your health concerns and develop a treatment plan. They communicate directly with your gastroenterologist to care for you. Seeing the Advanced Practice Practitioners on your physician's team can help you by facilitating care more promptly, often allowing for earlier appointments, access to diagnostic testing, procedures, and other specialty referrals.

## 2024-03-31 ENCOUNTER — Ambulatory Visit: Payer: Self-pay | Admitting: Pediatrics

## 2024-03-31 ENCOUNTER — Ambulatory Visit (HOSPITAL_COMMUNITY)
Admission: RE | Admit: 2024-03-31 | Discharge: 2024-03-31 | Disposition: A | Discharge status: Home / Self Care | Source: Ambulatory Visit | Attending: Pediatrics | Admitting: Pediatrics

## 2024-03-31 DIAGNOSIS — K824 Cholesterolosis of gallbladder: Secondary | ICD-10-CM | POA: Diagnosis present

## 2024-04-09 ENCOUNTER — Other Ambulatory Visit: Payer: Self-pay | Admitting: Internal Medicine

## 2024-04-09 DIAGNOSIS — K219 Gastro-esophageal reflux disease without esophagitis: Secondary | ICD-10-CM

## 2024-04-09 NOTE — Telephone Encounter (Signed)
 Requested Prescriptions  Pending Prescriptions Disp Refills   pantoprazole  (PROTONIX ) 40 MG tablet [Pharmacy Med Name: PANTOPRAZOLE  SOD DR 40 MG TAB] 180 tablet 1    Sig: TAKE 1 TABLET BY MOUTH TWICE A DAY     Gastroenterology: Proton Pump Inhibitors Passed - 04/09/2024  5:35 PM      Passed - Valid encounter within last 12 months    Recent Outpatient Visits           4 months ago Gastroesophageal reflux disease without esophagitis   [facility] at [facility], Leita DEL, MD   6 months ago Annual physical exam   [facility] at [facility], Leita DEL, MD

## 2024-04-12 NOTE — Progress Notes (Unsigned)
 [facility] History and Physical   Primary Care Physician:  Justus Leita DEL, MD   Reason for Procedure:  GERD, heartburn, dysphagia  Plan:    EGD with possible dilation   The patient was provided an opportunity to ask questions and all were answered. The patient agreed with the plan.   HPI: Madeline Moon is a 63 y.o. female undergoing EGD with possible dilation for investigation of GERD, dysphagia and heartburn.  Madeline Moon reports a longstanding history of reflux and swallowing difficulties that have progressively worsened.  She has been on pantoprazole  40 mg p.o. twice daily which seems to ameliorate her symptoms of GERD but has not eradicated them.  She has had ongoing symptoms of solid food dysphagia that have not changed with PPI.   Past Medical History:  Diagnosis Date   COVID-19 05/15/2021   also had covid previously, pt unsure of date   Depression, major, single episode, complete remission 12/16/2014   only for a few months, then completely resolved   GERD (gastroesophageal reflux disease)    History of kidney stones    around 2014   History of prediabetes    09/19/21 Hgb A1c improved to 5.5   Pleurisy 2022   Pneumonia 06/07/2020   pneumonia after having Covid, see 06/07/20 chest xray in Care Everywhere   Renal disorder    kidney stones   Shoulder pain, right 2021   resolved per pt   Vertigo    occasional dizziness   Wears glasses     Past Surgical History:  Procedure Laterality Date   BLADDER SUSPENSION N/A 07/01/2022   Procedure: TRANSVAGINAL TAPE (TVT) PROCEDURE;  Surgeon: Marilynne Rosaline SAILOR, MD;  Location: [facility];  Service: Gynecology;  Laterality: N/A;   COLONOSCOPY  2013   COLONOSCOPY  2011   COLONOSCOPY WITH PROPOFOL  N/A 10/08/2021   Procedure: COLONOSCOPY WITH PROPOFOL ;  Surgeon: Jinny Carmine, MD;  Location: [facility];  Service: Endoscopy;  Laterality: N/A;   CYSTOSCOPY N/A 07/01/2022   Procedure: CYSTOSCOPY;   Surgeon: Marilynne Rosaline SAILOR, MD;  Location: [facility];  Service: Gynecology;  Laterality: N/A;   CYSTOSCOPY/URETEROSCOPY/HOLMIUM LASER/STENT PLACEMENT Left 12/02/2023   Procedure: CYSTOSCOPY/URETEROSCOPY/HOLMIUM LASER/STENT PLACEMENT;  Surgeon: Twylla Glendia BROCKS, MD;  Location: [facility];  Service: Urology;  Laterality: Left;   PERINEOPLASTY N/A 07/01/2022   Procedure: PERINEOPLASTY;  Surgeon: Marilynne Rosaline SAILOR, MD;  Location: [facility];  Service: Gynecology;  Laterality: N/A;   RIGHT OOPHORECTOMY     pt not sure if her ovary was removed or just her tube   TUBAL LIGATION     XI ROBOTIC ASSISTED TOTAL HYSTERECTOMY WITH SACROCOLPOPEXY N/A 07/01/2022   Procedure: XI ROBOTIC ASSISTED TOTAL HYSTERECTOMY WITH BILATERAL SALPINGO-OOPHORECTOMY AND SACROCOLPOPEXY;  Surgeon: Marilynne Rosaline SAILOR, MD;  Location: [facility];  Service: Gynecology;  Laterality: N/A;  total time requested is 3.5 hours    Prior to Admission medications   Medication Sig Start Date End Date Taking? Authorizing Provider  calcium carbonate (TUMS EX) 750 MG chewable tablet Chew 1 tablet by mouth as needed for heartburn.    [provider]  diphenhydrAMINE  (BENADRYL ) 25 MG tablet Take 25 mg by mouth every 6 (six) hours as needed for itching (When take oxycodone ).    [provider]  ibuprofen  (ADVIL ) 200 MG tablet Take 400 mg by mouth every 6 (six) hours as needed for mild pain (pain score 1-3) or fever.    [provider]  Magnesium 400 MG TABS Take 400 mg by mouth daily.    [provider]  meloxicam  (MOBIC ) 15 MG tablet Take 15 mg by mouth daily.    [provider]  oxybutynin  (DITROPAN ) 5 MG tablet 1 tab tid prn frequency,urgency, bladder spasm Patient not taking: Reported on 03/23/2024 12/08/23   Twylla Glendia BROCKS, MD  oxyCODONE  (ROXICODONE ) 5 MG immediate release tablet Take 1 tablet (5 mg total) by mouth every 8 (eight) hours as needed  for up to 8 doses. Patient not taking: Reported on 03/23/2024 11/28/23   Cyrena Mylar, MD  pantoprazole  (PROTONIX ) 40 MG tablet TAKE 1 TABLET BY MOUTH TWICE A DAY 04/09/24   Justus Leita DEL, MD  Potassium 99 MG TABS Take 99 mg by mouth daily.    [provider]  SODIUM FLUORIDE 5000 PPM 1.1 % PSTE as directed. 01/02/24   [provider]  VITAMIN D -VITAMIN K PO Take 1 tablet by mouth daily.    [provider]    Current Outpatient Medications  Medication Sig Dispense Refill   calcium carbonate (TUMS EX) 750 MG chewable tablet Chew 1 tablet by mouth as needed for heartburn.     diphenhydrAMINE  (BENADRYL ) 25 MG tablet Take 25 mg by mouth every 6 (six) hours as needed for itching (When take oxycodone ).     ibuprofen  (ADVIL ) 200 MG tablet Take 400 mg by mouth every 6 (six) hours as needed for mild pain (pain score 1-3) or fever.     Magnesium 400 MG TABS Take 400 mg by mouth daily.     meloxicam  (MOBIC ) 15 MG tablet Take 15 mg by mouth daily.     oxybutynin  (DITROPAN ) 5 MG tablet 1 tab tid prn frequency,urgency, bladder spasm (Patient not taking: Reported on 03/23/2024) 15 tablet 0   oxyCODONE  (ROXICODONE ) 5 MG immediate release tablet Take 1 tablet (5 mg total) by mouth every 8 (eight) hours as needed for up to 8 doses. (Patient not taking: Reported on 03/23/2024) 8 tablet 0   pantoprazole  (PROTONIX ) 40 MG tablet TAKE 1 TABLET BY MOUTH TWICE A DAY 180 tablet 1   Potassium 99 MG TABS Take 99 mg by mouth daily.     SODIUM FLUORIDE 5000 PPM 1.1 % PSTE as directed.     VITAMIN D -VITAMIN K PO Take 1 tablet by mouth daily.     No current facility-administered medications for this visit.    Allergies as of 04/14/2024 - Review Complete 03/23/2024  Allergen Reaction Noted   Augmentin  [amoxicillin -pot clavulanate] Rash 12/16/2022   Morphine  Itching 06/21/2022   Oxycodone  Itching 06/21/2022    Family History  Problem Relation Age of Onset   Dementia Mother    CAD Father     AAA (abdominal aortic aneurysm) Father    Thyroid  nodules Father    AAA (abdominal aortic aneurysm) Brother    Thyroid  nodules Paternal Grandmother    Colon cancer Paternal Grandfather 9   Breast cancer Neg Hx     Social History   Socioeconomic History   Marital status: Married    Spouse name: Not on file   Number of children: Not on file   Years of education: Not on file   Highest education level: Associate degree: academic program  Occupational History   Not on file  Tobacco Use   Smoking status: Never   Smokeless tobacco: Never  Vaping Use   Vaping status: Never Used  Substance and Sexual Activity   Alcohol use: Not Currently  Comment: occasional, about one drink once a month   Drug use: No   Sexual activity: Not on file    Comment: tubal ligation  Other Topics Concern   Not on file  Social History Narrative   Not on file   Social Drivers of Health   Financial Resource Strain: Low Risk  (04/22/2023)   Overall Financial Resource Strain (CARDIA)    Difficulty of Paying Living Expenses: Not very hard  Food Insecurity: No Food Insecurity (04/22/2023)   Hunger Vital Sign    Worried About Running Out of Food in the Last Year: Never true    Ran Out of Food in the Last Year: Never true  Transportation Needs: No Transportation Needs (04/22/2023)   PRAPARE - Administrator, Civil Service (Medical): No    Lack of Transportation (Non-Medical): No  Physical Activity: Insufficiently Active (04/22/2023)   Exercise Vital Sign    Days of Exercise per Week: 1 day    Minutes of Exercise per Session: 10 min  Stress: No Stress Concern Present (04/22/2023)   [employer] of Occupational Health - Occupational Stress Questionnaire    Feeling of Stress : Only a little  Social Connections: Socially Integrated (04/22/2023)   Social Connection and Isolation Panel    Frequency of Communication with Friends and Family: More than three times a week    Frequency of  Social Gatherings with Friends and Family: Once a week    Attends Religious Services: More than 4 times per year    Active Member of [employer] or Organizations: Yes    Attends Engineer, Structural: More than 4 times per year    Marital Status: Married  Catering Manager Violence: Not At Risk (09/19/2021)   Humiliation, Afraid, Rape, and Kick questionnaire    Fear of Current or Ex-Partner: No    Emotionally Abused: No    Physically Abused: No    Sexually Abused: No    Review of Systems:  All other review of systems negative except as mentioned in the HPI.  Physical Exam: Vital signs There were no vitals taken for this visit.  General:   Alert,  Well-developed, well-nourished, pleasant and cooperative in NAD Airway:  Mallampati  Lungs:  Clear throughout to auscultation.   Heart:  Regular rate and rhythm; no murmurs, clicks, rubs,  or gallops. Abdomen:  Soft, nontender and nondistended. Normal bowel sounds.   Neuro/Psych:  Normal mood and affect. A and O x 3  Inocente Hausen, MD [facility]

## 2024-04-14 ENCOUNTER — Encounter: Payer: Self-pay | Admitting: Pediatrics

## 2024-04-14 ENCOUNTER — Ambulatory Visit: Admitting: Pediatrics

## 2024-04-14 VITALS — BP 129/76 | HR 64 | Temp 97.7°F | Resp 12 | Ht 65.0 in | Wt 173.0 lb

## 2024-04-14 DIAGNOSIS — R1310 Dysphagia, unspecified: Secondary | ICD-10-CM | POA: Diagnosis not present

## 2024-04-14 DIAGNOSIS — K317 Polyp of stomach and duodenum: Secondary | ICD-10-CM

## 2024-04-14 DIAGNOSIS — K229 Disease of esophagus, unspecified: Secondary | ICD-10-CM | POA: Diagnosis not present

## 2024-04-14 DIAGNOSIS — K449 Diaphragmatic hernia without obstruction or gangrene: Secondary | ICD-10-CM

## 2024-04-14 DIAGNOSIS — R12 Heartburn: Secondary | ICD-10-CM

## 2024-04-14 DIAGNOSIS — K219 Gastro-esophageal reflux disease without esophagitis: Secondary | ICD-10-CM | POA: Diagnosis present

## 2024-04-14 DIAGNOSIS — K222 Esophageal obstruction: Secondary | ICD-10-CM | POA: Diagnosis not present

## 2024-04-14 NOTE — Op Note (Signed)
 [facility] Patient Name: Madeline Moon Procedure Date: 04/14/2024 9:19 AM MRN: [id number] Endoscopist: Inocente Hausen , MD, [id number] Age: 62 Referring MD:  Date of Birth: 06-18-1960 Gender: Female Account #: [id number] Procedure:                Upper GI endoscopy Indications:              Dysphagia, Heartburn, Follow-up of                            gastro-esophageal reflux disease, Failure to                            respond to medical treatment Medicines:                Monitored Anesthesia Care Procedure:                Pre-Anesthesia Assessment:                           - Prior to the procedure, a History and Physical                            was performed, and patient medications and                            allergies were reviewed. The patient's tolerance of                            previous anesthesia was also reviewed. The risks                            and benefits of the procedure and the sedation                            options and risks were discussed with the patient.                            All questions were answered, and informed consent                            was obtained. Prior Anticoagulants: The patient has                            taken no anticoagulant or antiplatelet agents. ASA                            Grade Assessment: II - A patient with mild systemic                            disease. After reviewing the risks and benefits,                            the patient was deemed in satisfactory condition to  undergo the procedure.                           After obtaining informed consent, the endoscope was                            passed under direct vision. Throughout the                            procedure, the patient's blood pressure, pulse, and                            oxygen saturations were monitored continuously. The                            Olympus scope [id number] was introduced  through the                            mouth, and advanced to the second part of duodenum.                            The upper GI endoscopy was accomplished without                            difficulty. The patient tolerated the procedure                            well. Scope In: Scope Out: Findings:                 No endoscopic abnormality was evident in the                            esophagus to explain the patient's complaint of                            dysphagia. It was decided, however, to proceed with                            dilation of the entire esophagus. A guidewire was                            placed and the scope was withdrawn. Dilation was                            performed with a Savary dilator with no resistance                            at 17 mm and 18 mm. Biopsies were obtained from the                            proximal and distal esophagus with cold forceps for  histology for evaluation of eosinophilic                            esophagitis.                           A non-obstructing and mild Schatzki ring was found                            in the lower third of the esophagus. This was                            dilated with Savary dilation. Additionally, cold                            forceps biopsies were utilized to disrupt the ring.                            The tissue specimens were inadvertently placed in                            the pathology jar with the proximal esophagus                            specimens rather than the distal esophagus                            specimens.                           Multiple 4 to 7 mm sessile polyps were found in the                            gastric fundus and in the gastric body. Biopsies                            were taken with a cold forceps for histology.                           The gastric body, gastric antrum, cardia (on                            retroflexion)  and gastric fundus (on retroflexion)                            were normal. Biopsies were taken with a cold                            forceps for Helicobacter pylori testing.                           A small hiatal hernia was present.                           The  duodenal bulb and second portion of the                            duodenum were normal. Complications:            No immediate complications. Estimated blood loss:                            Minimal. Estimated Blood Loss:     Estimated blood loss was minimal. Impression:               - No endoscopic esophageal abnormality to explain                            patient's dysphagia. Esophagus dilated. Dilated.                           - Non-obstructing and mild Schatzki ring. Dilated                            and ring disruption performed with cold biopsy                            forceps.                           - Multiple gastric polyps. Biopsied.                           - Normal gastric body, antrum, cardia and gastric                            fundus. Biopsied.                           - Small hiatal hernia.                           - Normal duodenal bulb and second portion of the                            duodenum.                           - Biopsies were taken with a cold forceps for                            evaluation of eosinophilic esophagitis. Recommendation:           - Discharge patient to home (ambulatory).                           - Await pathology results.                           - Continue present medications.                           -  The findings and recommendations were discussed                            with the patient's family.                           - Return to GI clinic as previously scheduled.                           - Patient has a contact number available for                            emergencies. The signs and symptoms of potential                            delayed  complications were discussed with the                            patient. Return to normal activities tomorrow.                            Written discharge instructions were provided to the                            patient. Inocente Hausen, MD 04/14/2024 10:00:44 AM This report has been signed electronically.

## 2024-04-14 NOTE — Progress Notes (Signed)
 Pt's states no medical or surgical changes since previsit or office visit.

## 2024-04-14 NOTE — Progress Notes (Signed)
 Sedate, gd SR, tolerated procedure well, VSS, report to RN

## 2024-04-14 NOTE — Progress Notes (Signed)
 Called to room to assist during endoscopic procedure.  Patient ID and intended procedure confirmed with present staff. Received instructions for my participation in the procedure from the performing physician.

## 2024-04-14 NOTE — Patient Instructions (Signed)
Discharge instructions given. Handouts on Hiatal Hernia and Dilatation diet. Resume previous medications. YOU HAD AN ENDOSCOPIC PROCEDURE TODAY AT [facility]:   Refer to the procedure report that was given to you for any specific questions about what was found during the examination.  If the procedure report does not answer your questions, please call your gastroenterologist to clarify.  If you requested that your care partner not be given the details of your procedure findings, then the procedure report has been included in a sealed envelope for you to review at your convenience later.  YOU SHOULD EXPECT: Some feelings of bloating in the abdomen. Passage of more gas than usual.  Walking can help get rid of the air that was put into your GI tract during the procedure and reduce the bloating. If you had a lower endoscopy (such as a colonoscopy or flexible sigmoidoscopy) you may notice spotting of blood in your stool or on the toilet paper. If you underwent a bowel prep for your procedure, you may not have a normal bowel movement for a few days.  Please Note:  You might notice some irritation and congestion in your nose or some drainage.  This is from the oxygen used during your procedure.  There is no need for concern and it should clear up in a day or so.  SYMPTOMS TO REPORT IMMEDIATELY:  Following upper endoscopy (EGD)  Vomiting of blood or coffee ground material  New chest pain or pain under the shoulder blades  Painful or persistently difficult swallowing  New shortness of breath  Fever of 100F or higher  Black, tarry-looking stools  For urgent or emergent issues, a gastroenterologist can be reached at any hour by calling [phone]. Do not use MyChart messaging for urgent concerns.    DIET:  We do recommend a small meal at first, but then you may proceed to your regular diet.  Drink plenty of fluids but you should avoid alcoholic beverages for 24 hours.  ACTIVITY:   You should plan to take it easy for the rest of today and you should NOT DRIVE or use heavy machinery until tomorrow (because of the sedation medicines used during the test).    FOLLOW UP: Our staff will call the number listed on your records the next business day following your procedure.  We will call around 7:15- 8:00 am to check on you and address any questions or concerns that you may have regarding the information given to you following your procedure. If we do not reach you, we will leave a message.     If any biopsies were taken you will be contacted by phone or by letter within the next 1-3 weeks.  Please call us at [phone] if you have not heard about the biopsies in 3 weeks.    SIGNATURES/CONFIDENTIALITY: You and/or your care partner have signed paperwork which will be entered into your electronic medical record.  These signatures attest to the fact that that the information above on your After Visit Summary has been reviewed and is understood.  Full responsibility of the confidentiality of this discharge information lies with you and/or your care-partner.

## 2024-04-15 ENCOUNTER — Telehealth: Payer: Self-pay

## 2024-04-15 NOTE — Telephone Encounter (Signed)
  Follow up Call-     04/14/2024    8:46 AM  Call back number  Post procedure Call Back phone  # [phone]  Permission to leave phone message Yes     Patient questions:  Do you have a fever, pain , or abdominal swelling? No. Pain Score  0 *  Have you tolerated food without any problems? Yes.    Have you been able to return to your normal activities? Yes.    Do you have any questions about your discharge instructions: Diet   No. Medications  No. Follow up visit  No.  Do you have questions or concerns about your Care? No.  Actions: * If pain score is 4 or above: No action needed, pain <4.

## 2024-04-19 LAB — SURGICAL PATHOLOGY

## 2024-04-21 ENCOUNTER — Ambulatory Visit: Payer: Self-pay | Admitting: Pediatrics

## 2024-05-19 ENCOUNTER — Ambulatory Visit: Payer: Self-pay

## 2024-05-19 ENCOUNTER — Encounter: Payer: Self-pay | Admitting: Internal Medicine

## 2024-05-19 ENCOUNTER — Ambulatory Visit: Admitting: Internal Medicine

## 2024-05-19 VITALS — BP 102/58 | HR 85 | Ht 65.0 in | Wt 173.0 lb

## 2024-05-19 DIAGNOSIS — R102 Pelvic and perineal pain unspecified side: Secondary | ICD-10-CM

## 2024-05-19 DIAGNOSIS — R109 Unspecified abdominal pain: Secondary | ICD-10-CM | POA: Diagnosis not present

## 2024-05-19 LAB — POCT URINE DIPSTICK
Bilirubin, UA: NEGATIVE
Blood, UA: NEGATIVE
Glucose, UA: NEGATIVE mg/dL
Ketones, POC UA: NEGATIVE mg/dL
Leukocytes, UA: NEGATIVE
Nitrite, UA: NEGATIVE
POC PROTEIN,UA: NEGATIVE
Spec Grav, UA: 1.02 (ref 1.010–1.025)
Urobilinogen, UA: 0.2 U/dL
pH, UA: 6 (ref 5.0–8.0)

## 2024-05-19 MED ORDER — CYCLOBENZAPRINE HCL 10 MG PO TABS: 10.0000 mg | ORAL_TABLET | Freq: Every day | ORAL | 0 refills | Status: AC

## 2024-05-19 NOTE — Telephone Encounter (Signed)
 Noted  Pt has appt.  KP

## 2024-05-19 NOTE — Progress Notes (Signed)
 Date:  05/19/2024   Name:  Madeline Moon   DOB:  1961-01-22   MRN:  [id number]   Chief Complaint: Abdominal Pain (Since Monday; lower abdomen; denies nausea/emesis/fevers or urinary s/s; did have constipation for several days, normal BM today)  Abdominal Pain This is a new problem. Episode onset: three days ago. The onset quality is sudden. The problem occurs constantly. The problem has been gradually improving. The pain is located in the suprapubic region. The pain is mild. The quality of the pain is cramping and a sensation of fullness. The abdominal pain does not radiate. Associated symptoms include constipation. Pertinent negatives include no dysuria, fever, hematochezia, hematuria, melena, vomiting or weight loss. Exacerbated by: moving and lifting - may have strained her muscles at work. Relieved by: felt a bit better after a stool this AM. Treatments tried: took a flexeril  this AM with some improvement.    Review of Systems  Constitutional:  Negative for chills, fatigue, fever and weight loss.  Respiratory:  Negative for chest tightness and shortness of breath.   Cardiovascular:  Negative for chest pain and palpitations.  Gastrointestinal:  Positive for abdominal pain and constipation. Negative for hematochezia, melena and vomiting.  Genitourinary:  Negative for dysuria, hematuria and urgency.  Psychiatric/Behavioral:  Negative for dysphoric mood and sleep disturbance. The patient is not nervous/anxious.      Lab Results  Component Value Date   NA 144 01/13/2024   K 5.1 01/13/2024   CO2 19 (L) 01/13/2024   GLUCOSE 155 (H) 12/05/2023   BUN 22 12/05/2023   CREATININE 1.00 01/13/2024   CALCIUM 9.1 01/13/2024   EGFR 63 01/13/2024   GFRNONAA >60 12/05/2023   Lab Results  Component Value Date   CHOL 222 (H) 09/30/2023   HDL 70 09/30/2023   LDLCALC 139 (H) 09/30/2023   TRIG 75 09/30/2023   CHOLHDL 3.2 09/30/2023   Lab Results  Component Value Date   TSH 0.944  09/30/2023   Lab Results  Component Value Date   HGBA1C 6.1 (H) 09/30/2023   Lab Results  Component Value Date   WBC 4.7 12/05/2023   HGB 14.0 12/05/2023   HCT 44.0 12/05/2023   MCV 80.9 12/05/2023   PLT 206 12/05/2023   Lab Results  Component Value Date   ALT 16 11/23/2023   AST 21 11/23/2023   ALKPHOS 55 11/23/2023   BILITOT 0.8 11/23/2023   Lab Results  Component Value Date   VD25OH 37.07 09/24/2022     Patient Active Problem List   Diagnosis Date Noted   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) 09/24/2022   Uterovaginal prolapse, incomplete 07/01/2022   Prediabetes 10/08/2020   Chronic right shoulder pain 02/28/2020   Elevated blood pressure reading 10/07/2019   Gastroesophageal reflux disease without esophagitis 10/06/2018   Palmar fascial fibromatosis 10/06/2018   Hyperlipidemia, mild 08/12/2016   Mixed urge and stress incontinence 11/09/2014   Osteoarthritis 11/09/2014    Allergies[1]  Past Surgical History:  Procedure Laterality Date   BLADDER SUSPENSION N/A 07/01/2022   Procedure: TRANSVAGINAL TAPE (TVT) PROCEDURE;  Surgeon: Marilynne Rosaline SAILOR, MD;  Location: [facility];  Service: Gynecology;  Laterality: N/A;   COLONOSCOPY  2013   COLONOSCOPY  2011   COLONOSCOPY WITH PROPOFOL  N/A 10/08/2021   Procedure: COLONOSCOPY WITH PROPOFOL ;  Surgeon: Jinny Carmine, MD;  Location: [facility];  Service: Endoscopy;  Laterality: N/A;   CYSTOSCOPY N/A 07/01/2022   Procedure: CYSTOSCOPY;  Surgeon: Marilynne,  Rosaline SAILOR, MD;  Location: [facility];  Service: Gynecology;  Laterality: N/A;   CYSTOSCOPY/URETEROSCOPY/HOLMIUM LASER/STENT PLACEMENT Left 12/02/2023   Procedure: CYSTOSCOPY/URETEROSCOPY/HOLMIUM LASER/STENT PLACEMENT;  Surgeon: Twylla Glendia BROCKS, MD;  Location: [facility];  Service: Urology;  Laterality: Left;   PERINEOPLASTY N/A 07/01/2022   Procedure: PERINEOPLASTY;  Surgeon: Marilynne Rosaline SAILOR, MD;  Location[facility];  Service: Gynecology;  Laterality: N/A;   RIGHT OOPHORECTOMY     pt not sure if her ovary was removed or just her tube   TUBAL LIGATION     XI ROBOTIC ASSISTED TOTAL HYSTERECTOMY WITH SACROCOLPOPEXY N/A 07/01/2022   Procedure: XI ROBOTIC ASSISTED TOTAL HYSTERECTOMY WITH BILATERAL SALPINGO-OOPHORECTOMY AND SACROCOLPOPEXY;  Surgeon: Marilynne Rosaline SAILOR, MD;  Location: [facility];  Service: Gynecology;  Laterality: N/A;  total time requested is 3.5 hours    Social History[2]   Medication list has been reviewed and updated.  Active Medications[3]     11/21/2023    8:57 AM 09/26/2023    9:19 AM 05/26/2023    1:27 PM 04/23/2023    9:46 AM  GAD 7 : Generalized Anxiety Score  Nervous, Anxious, on Edge 0 2 0 0  Control/stop worrying 0 1 0 0  Worry too much - different things 0 2 0 0  Trouble relaxing 0 1 0 0  Restless 0 1 0 0  Easily annoyed or irritable 0  0 0  Afraid - awful might happen 0 0 0 0  Total GAD 7 Score 0  0 0  Anxiety Difficulty Not difficult at all Not difficult at all Not difficult at all Not difficult at all       11/21/2023    8:57 AM 09/26/2023    9:18 AM 05/26/2023    1:27 PM  Depression screen PHQ 2/9  Decreased Interest 0 0 0  Down, Depressed, Hopeless 0 0 0  PHQ - 2 Score 0 0 0  Altered sleeping 0 2 0  Tired, decreased energy 0 2 0  Change in appetite 0 0 0  Feeling bad or failure about yourself  0 0 0  Trouble concentrating 0 1 0  Moving slowly or fidgety/restless 0 1 0  Suicidal thoughts 0 0 0  PHQ-9 Score 0  6  0   Difficult doing work/chores Not difficult at all Not difficult at all Not difficult at all     Data saved with a previous flowsheet row definition    BP Readings from Last 3 Encounters:  05/19/24 (!) 102/58  04/14/24 129/76  03/23/24 (!) 140/90    Physical Exam Constitutional:      Appearance: She is well-developed.  Cardiovascular:     Rate and Rhythm:  Normal rate and regular rhythm.  Pulmonary:     Effort: Pulmonary effort is normal. No respiratory distress.     Breath sounds: No wheezing or rhonchi.  Abdominal:     General: Abdomen is flat. Bowel sounds are decreased.     Palpations: Abdomen is soft. There is no fluid wave, hepatomegaly, splenomegaly or mass.     Tenderness: There is abdominal tenderness in the suprapubic area. There is no right CVA tenderness, left CVA tenderness, guarding or rebound.  Neurological:     Mental Status: She is alert.    Lab Results  Component Value Date   COLORU yellow 05/19/2024   CLARITYU clear 05/19/2024   GLUCOSEUR negative 05/19/2024   BILIRUBINUR negative 05/19/2024   KETONESU Trace (A) 11/27/2023  SPECGRAV 1.020 05/19/2024   RBCUR negative 05/19/2024   PHUR 6.0 05/19/2024   PROTEINUR (A) 12/12/2023    TEST NOT REPORTED DUE TO COLOR INTERFERENCE OF URINE PIGMENT   UROBILINOGEN 0.2 05/19/2024   LEUKOCYTESUR Negative 05/19/2024     Wt Readings from Last 3 Encounters:  05/19/24 173 lb (78.5 kg)  04/14/24 173 lb (78.5 kg)  03/23/24 173 lb 4 oz (78.6 kg)    BP (!) 102/58   Pulse 85   Ht 5' 5 (1.651 m)   Wt 173 lb (78.5 kg)   SpO2 99%   BMI 28.79 kg/m   Assessment and Plan:  Problem List Items Addressed This Visit   None Visit Diagnoses       Pelvic pain    -  Primary   UA negative may be due to recent constipation - maintain soft stools call or return if sx worsen   Relevant Orders   POCT URINE DIPSTICK (Completed)     Abdominal wall pain       pain may be muscular - flexeril  at bedtime, tylenol  tid, heat follow up if needed   Relevant Medications   cyclobenzaprine  (FLEXERIL ) 10 MG tablet       No follow-ups on file.    Leita HILARIO Adie, MD [facility] [place]           [1]  Allergies Allergen Reactions   Augmentin  [Amoxicillin -Pot Clavulanate] Rash   Morphine  Itching    Nose itching and burning. Patient stated  she would take Morphine  if she really needed it.   Oxycodone  Itching  [2]  Social History Tobacco Use   Smoking status: Never   Smokeless tobacco: Never  Vaping Use   Vaping status: Never Used  Substance Use Topics   Alcohol use: Not Currently    Comment: occasional, about one drink once a month   Drug use: No  [3]  Current Meds  Medication Sig   calcium carbonate (TUMS EX) 750 MG chewable tablet Chew 1 tablet by mouth as needed for heartburn.   cyclobenzaprine  (FLEXERIL ) 10 MG tablet Take 1 tablet (10 mg total) by mouth at bedtime.   ibuprofen  (ADVIL ) 200 MG tablet Take 400 mg by mouth every 6 (six) hours as needed for mild pain (pain score 1-3) or fever.   Magnesium 400 MG TABS Take 400 mg by mouth daily.   meloxicam  (MOBIC ) 15 MG tablet Take 15 mg by mouth daily.   pantoprazole  (PROTONIX ) 40 MG tablet TAKE 1 TABLET BY MOUTH TWICE A DAY   Potassium 99 MG TABS Take 99 mg by mouth daily.   SODIUM FLUORIDE 5000 PPM 1.1 % PSTE as directed.   VITAMIN D -VITAMIN K PO Take 1 tablet by mouth daily.

## 2024-05-19 NOTE — Telephone Encounter (Signed)
 Patient/caller refused triage.  Reason for refusal: time constraints.   Copied from CRM #[id number]. Topic: Clinical - Red Word Triage >> May 19, 2024  8:19 AM Carlyon D wrote: Red Word that prompted transfer to Nurse Triage: Lower  extreme abdominal pain. >> May 19, 2024  8:49 AM Mercedes MATSU wrote: Patient called in very upset, she said she was on hold for 20 mins and then got disconnected. Patient refused to go back on hold, she also refused a call back and demanded she be scheduled. I then asked patient is she was having extreme stomach pain and she said she just wanted an appointment. Patient was scheduled, she can also be reached at [phone]. Reason for Disposition  Caller hangs up  Answer Assessment - Initial Assessment Questions 1. SITUATION:  Document reason for call.    Pt refused triage. Appt made for today by specialist. Pt requested no call back and hung up.  Protocols used: Difficult Call-A-AH

## 2024-05-21 NOTE — Progress Notes (Unsigned)
 "    05/21/2024 Madeline Moon [id number] May 13, 1961  Referring provider: Justus Leita DEL, MD Primary GI doctor: Dr. Suzann  ASSESSMENT AND PLAN:  GERD with dysphagia 04/14/2024 EGD unremarkable esophagus empirically dilated, nonobstructing mild Schatzki ring dilated, multiple gastric polyps small HH Path: Negative H. pylori negative intestinal metaplasia fundic gland polyps negative EOE mild reflux esophagitis  History of gallbladder polyp 03/31/2024 RUQ US  5 mm gallbladder polyp unchanged since 2011 indicating a benign etiology  Family history of colon cancer  (paternal grandfather diagnosed less than age 62) 2023 colonoscopy unremarkable 10-year follow-up  Patient Care Team: Justus Leita DEL, MD as PCP - General (Internal Medicine) Leora Lynwood SAUNDERS, MD as Consulting Physician (Orthopedic Surgery) Jinny Carmine, MD as Consulting Physician (Gastroenterology)  HISTORY OF PRESENT ILLNESS: 63 y.o. female with a past medical history listed below presents for evaluation of ***.   *** Discussed the use of AI scribe software for clinical note transcription with the patient, who gave verbal consent to proceed.  History of Present Illness            She  reports that she has never smoked. She has never used smokeless tobacco. She reports that she does not currently use alcohol. She reports that she does not use drugs.  RELEVANT GI HISTORY, IMAGING AND LABS: Results          CBC    Component Value Date/Time   WBC 4.7 12/05/2023 1424   RBC 5.44 (H) 12/05/2023 1424   HGB 14.0 12/05/2023 1424   HGB 12.6 09/30/2023 0730   HCT 44.0 12/05/2023 1424   HCT 40.9 09/30/2023 0730   PLT 206 12/05/2023 1424   PLT 254 09/30/2023 0730   MCV 80.9 12/05/2023 1424   MCV 82 09/30/2023 0730   MCH 25.7 (L) 12/05/2023 1424   MCHC 31.8 12/05/2023 1424   RDW 17.9 (H) 12/05/2023 1424   RDW 15.7 (H) 09/30/2023 0730   LYMPHSABS 1.1 11/28/2023 0613   LYMPHSABS 1.2 09/30/2023 0730    MONOABS 0.3 11/28/2023 0613   EOSABS 0.1 11/28/2023 0613   EOSABS 0.1 09/30/2023 0730   BASOSABS 0.0 11/28/2023 0613   BASOSABS 0.0 09/30/2023 0730   Recent Labs    09/30/23 0730 11/23/23 0431 11/28/23 0613 12/05/23 1424  HGB 12.6 14.4 14.0 14.0    CMP     Component Value Date/Time   NA 144 01/13/2024 1130   K 5.1 01/13/2024 1130   CL 104 01/13/2024 1130   CO2 19 (L) 01/13/2024 1130   GLUCOSE 155 (H) 12/05/2023 1424   BUN 22 12/05/2023 1424   BUN 14 09/30/2023 0730   CREATININE 1.00 01/13/2024 1130   CALCIUM 9.1 01/13/2024 1130   PROT 6.9 11/23/2023 0431   PROT 6.3 09/30/2023 0730   ALBUMIN 3.7 11/23/2023 0431   ALBUMIN 4.0 09/30/2023 0730   AST 21 11/23/2023 0431   ALT 16 11/23/2023 0431   ALKPHOS 55 11/23/2023 0431   BILITOT 0.8 11/23/2023 0431   BILITOT 0.4 09/30/2023 0730   GFRNONAA >60 12/05/2023 1424   GFRAA 89 12/13/2019 0959      Latest Ref Rng & Units 11/23/2023    4:31 AM 09/30/2023    7:30 AM 09/24/2022    8:39 AM  Hepatic Function  Total Protein 6.5 - 8.1 g/dL 6.9  6.3  7.3   Albumin 3.5 - 5.0 g/dL 3.7  4.0  4.1   AST 15 - 41 U/L 21  17  18    ALT 0 -  44 U/L 16  11  15    Alk Phosphatase 38 - 126 U/L 55  88  70   Total Bilirubin 0.0 - 1.2 mg/dL 0.8  0.4  0.6       Current Medications:   Current Outpatient Medications (Analgesics):    ibuprofen  (ADVIL ) 200 MG tablet, Take 400 mg by mouth every 6 (six) hours as needed for mild pain (pain score 1-3) or fever.   meloxicam  (MOBIC ) 15 MG tablet, Take 15 mg by mouth daily.  Current Outpatient Medications (Other):    calcium carbonate (TUMS EX) 750 MG chewable tablet, Chew 1 tablet by mouth as needed for heartburn.   cyclobenzaprine  (FLEXERIL ) 10 MG tablet, Take 1 tablet (10 mg total) by mouth at bedtime.   Magnesium 400 MG TABS, Take 400 mg by mouth daily.   pantoprazole  (PROTONIX ) 40 MG tablet, TAKE 1 TABLET BY MOUTH TWICE A DAY   Potassium 99 MG TABS, Take 99 mg by mouth daily.   SODIUM FLUORIDE  5000 PPM 1.1 % PSTE, as directed.   VITAMIN D -VITAMIN K PO, Take 1 tablet by mouth daily.  Medical History:  Past Medical History:  Diagnosis Date   COVID-19 05/15/2021   also had covid previously, pt unsure of date   Depression, major, single episode, complete remission 12/16/2014   only for a few months, then completely resolved   GERD (gastroesophageal reflux disease)    History of kidney stones    around 2014   History of prediabetes    09/19/21 Hgb A1c improved to 5.5   Pleurisy 2022   Pneumonia 06/07/2020   pneumonia after having Covid, see 06/07/20 chest xray in [facility]   Renal disorder    kidney stones   Shoulder pain, right 2021   resolved per pt   Vertigo    occasional dizziness   Wears glasses    Allergies: Allergies[1]   Surgical History:  She  has a past surgical history that includes Right oophorectomy; Tubal ligation; Colonoscopy with propofol  (N/A, 10/08/2021); Colonoscopy (2013); Colonoscopy (2011); Xi robotic assisted total hysterectomy with sacrocolpopexy (N/A, 07/01/2022); Bladder suspension (N/A, 07/01/2022); Cystoscopy (N/A, 07/01/2022); Perineoplasty (N/A, 07/01/2022); and Cystoscopy/ureteroscopy/holmium laser/stent placement (Left, 12/02/2023). Family History:  Her family history includes AAA (abdominal aortic aneurysm) in her brother and father; CAD in her father; Colon cancer (age of onset: 39) in her paternal grandfather; Dementia in her mother; Thyroid  nodules in her father and paternal grandmother.  REVIEW OF SYSTEMS  : All other systems reviewed and negative except where noted in the History of Present Illness.  PHYSICAL EXAM: There were no vitals taken for this visit. Physical Exam          Alan JONELLE Coombs, PA-C 7:42 AM      [1]  Allergies Allergen Reactions   Augmentin  [Amoxicillin -Pot Clavulanate] Rash   Morphine  Itching    Nose itching and burning. Patient stated she would take Morphine  if she really needed it.   Oxycodone  Itching    "

## 2024-05-24 ENCOUNTER — Ambulatory Visit: Admitting: Physician Assistant

## 2024-05-24 ENCOUNTER — Encounter: Payer: Self-pay | Admitting: Physician Assistant

## 2024-05-24 VITALS — BP 118/80 | HR 60 | Ht 65.0 in | Wt 175.1 lb

## 2024-05-24 DIAGNOSIS — Z8719 Personal history of other diseases of the digestive system: Secondary | ICD-10-CM | POA: Diagnosis not present

## 2024-05-24 DIAGNOSIS — Z1211 Encounter for screening for malignant neoplasm of colon: Secondary | ICD-10-CM

## 2024-05-24 DIAGNOSIS — R1310 Dysphagia, unspecified: Secondary | ICD-10-CM

## 2024-05-24 DIAGNOSIS — Z800 Family history of malignant neoplasm of digestive organs: Secondary | ICD-10-CM

## 2024-05-24 DIAGNOSIS — K2100 Gastro-esophageal reflux disease with esophagitis, without bleeding: Secondary | ICD-10-CM | POA: Diagnosis not present

## 2024-05-24 DIAGNOSIS — K824 Cholesterolosis of gallbladder: Secondary | ICD-10-CM

## 2024-05-24 DIAGNOSIS — K219 Gastro-esophageal reflux disease without esophagitis: Secondary | ICD-10-CM

## 2024-05-24 NOTE — Patient Instructions (Addendum)
 Take the pantoprazole  40 mg TWICE a day for at least 8 week and then you can go down to once a day Follow up 6 months, consider barium swallow at that time Avoid spicy and acidic foods Avoid fatty foods Limit your intake of coffee, tea, alcohol, and carbonated drinks Work to maintain a healthy weight Keep the head of the bed elevated at least 3 inches with blocks or a wedge pillow if you are having any nighttime symptoms Stay upright for 2 hours after eating Avoid meals and snacks three to four hours before bedtime  MAXIMUM AMOUNT OF TYLENOL  IN A DAY  The max you can take of tylenol  a day is 3000mg  daily This is a max of 6 pills a day of the regular tyelnol (500mg ) Or this is a max of 4 a day of the tylenol  arthritis (650mg )  Please makes sure you look at all other medications you are taking to assure they do NOT contain any additional tylenol , examples are cold medications, night time medications for sleep, or pain medications.    Reflux Gourmet Rescue  It is an ALGINATE THERAPY which is the only intervention that works to safeguard the esophagus by creating a protective barrier that actually stops reflux from happening. -The general directions for use are as stated on the packaging: Take 1 teaspoon (5 ml), or more as needed or as directed by your physician, after meals and before bed. -These general directions address the most common times for reflux to occur, but our Rescue products may be taken anytime. Some individuals may take our product preemptively, when they know they will suffer from reflux, or as needed - when discomfort arises. (If taken around food, it should be consumed last.) -You do not have to take 1 teaspoon (5 ml) of the product. While one teaspoon (5ml) may be the perfect average amount to relieve reflux suffering in some, others may require more or less. You may adjust the amount of Mint Chocolate Rescue and Vanilla Caramel Rescue to the lowest amount necessary to meet  your individual needs to improve your quality of life. -You may dilute the product if it is too viscous for you to consume. Keep in mind, however, that the thickness of the product was formulated to provide optimal coating and protection of your throat and esophagus. Though diluting the product is possible, it may reduce the protective function and/or length of action. -This can be used in conjunction with reflux medications and lifestyle changes.  100% ALL-NATURAL  Paraben FREE, glycerin FREE, & potassium FREE  Made entirely from all-natural ingredients considered safe for children and during pregnancy  No known side effects  All-natural flavor Gluten FREE  Allergen FREE  Vegan  Can find more information here: nameseizer.co.nz  Toileting tips to help with your constipation - Drink at least 64-80 ounces of water /liquid per day. - Establish a time to try to move your bowels every day.  For many people, this is after a cup of coffee or after a meal such as breakfast. - Sit all of the way back on the toilet keeping your back fairly straight and while sitting up, try to rest the tops of your forearms on your upper thighs.   - Raising your feet with a step stool/squatty potty can be helpful to improve the angle that allows your stool to pass through the rectum. - Relax the rectum feeling it bulge toward the toilet water .  If you feel your rectum raising toward your body,  you are contracting rather than relaxing. - Breathe in and slowly exhale. Belly breath by expanding your belly towards your belly button. Keep belly expanded as you gently direct pressure down and back to the anus.  A low pitched GRRR sound can assist with increasing intra-abdominal pressure.  (Can also trying to blow on a pinwheel and make it move, this helps with the same belly breathing) - Repeat 3-4 times. If unsuccessful, contract the pelvic floor to restore normal tone and get off the  toilet.  Avoid excessive straining. - To reduce excessive wiping by teaching your anus to normally contract, place hands on outer aspect of knees and resist knee movement outward.  Hold 5-10 second then place hands just inside of knees and resist inward movement of knees.  Hold 5 seconds.  Repeat a few times each way.  Go to the ER if unable to pass gas, severe AB pain, unable to hold down food, any shortness of breath of chest pain.  Here some information about pelvic floor dysfunction. This may be contributing to some of your symptoms. We will continue with our evaluation but I do want you to consider adding on fiber supplement with low-dose MiraLAX  daily. We could also refer to pelvic floor physical therapy.   Pelvic Floor Dysfunction, Female Pelvic floor dysfunction (PFD) is a condition that results when the group of muscles and connective tissues that support the organs in the pelvis (pelvic floor muscles) do not work well. These muscles and their connections form a sling that supports the colon and bladder. In women, they also support the uterus. PFD causes pelvic floor muscles to be too weak, too tight, or both. In PFD, muscle movements are not coordinated. This may cause bowel or bladder problems. It may also cause pain. What are the causes? This condition may be caused by an injury to the pelvic area or by a weakening of pelvic muscles. This often results from pregnancy and childbirth or other types of strain. In many cases, the exact cause is not known. What increases the risk? The following factors may make you more likely to develop this condition: Having chronic bladder tissue inflammation (interstitial cystitis). Being an older person. Being overweight. History of radiation treatment for cancer in the pelvic region. Previous pelvic surgery, such as removal of the uterus (hysterectomy). What are the signs or symptoms? Symptoms of this condition vary and may include: Bladder  symptoms, such as: Trouble starting urination and emptying the bladder. Frequent urinary tract infections. Leaking urine when coughing, laughing, or exercising (stress incontinence). Having to pass urine urgently or frequently. Pain when passing urine. Bowel symptoms, such as: Constipation. Urgent or frequent bowel movements. Incomplete bowel movements. Painful bowel movements. Leaking stool or gas. Unexplained genital or rectal pain. Genital or rectal muscle spasms. Low back pain. Other symptoms may include: A heavy, full, or aching feeling in the vagina. A bulge that protrudes into the vagina. Pain during or after sex. How is this diagnosed? This condition may be diagnosed based on: Your symptoms and medical history. A physical exam. During the exam, your health care provider may check your pelvic muscles for tightness, spasm, pain, or weakness. This may include a rectal exam and a pelvic exam. In some cases, you may have diagnostic tests, such as: Electrical muscle function tests. Urine flow testing. X-ray tests of bowel function. Ultrasound of the pelvic organs. How is this treated? Treatment for this condition depends on the symptoms. Treatment options include: Physical therapy. This may  include Kegel exercises to help relax or strengthen the pelvic floor muscles. Biofeedback. This type of therapy provides feedback on how tight your pelvic floor muscles are so that you can learn to control them. Internal or external massage therapy. A treatment that involves electrical stimulation of the pelvic floor muscles to help control pain (transcutaneous electrical nerve stimulation, or TENS). Sound wave therapy (ultrasound) to reduce muscle spasms. Medicines, such as: Muscle relaxants. Bladder control medicines. Surgery to reconstruct or support pelvic floor muscles may be an option if other treatments do not help. Follow these instructions at home: Activity Do your usual  activities as told by your health care provider. Ask your health care provider if you should modify any activities. Do pelvic floor strengthening or relaxing exercises at home as told by your physical therapist. Lifestyle Maintain a healthy weight. Eat foods that are high in fiber, such as beans, whole grains, and fresh fruits and vegetables. Limit foods that are high in fat and processed sugars, such as fried or sweet foods. Manage stress with relaxation techniques such as yoga or meditation. General instructions If you have problems with leakage: Use absorbable pads or wear padded underwear. Wash frequently with mild soap. Keep your genital and anal area as clean and dry as possible. Ask your health care provider if you should try a barrier cream to prevent skin irritation. Take warm baths to relieve pelvic muscle tension or spasms. Take over-the-counter and prescription medicines only as told by your health care provider. Keep all follow-up visits. How is this prevented? The cause of PFD is not always known, but there are a few things you can do to reduce the risk of developing this condition, including: Staying at a healthy weight. Getting regular exercise. Managing stress. Contact a health care provider if: Your symptoms are not improving with home care. You have signs or symptoms of PFD that get worse at home. You develop new signs or symptoms. You have signs of a urinary tract infection, such as: Fever. Chills. Increased urinary frequency. A burning feeling when urinating. You have not had a bowel movement in 3 days (constipation). Summary Pelvic floor dysfunction results when the muscles and connective tissues in your pelvic floor do not work well. These muscles and their connections form a sling that supports your colon and bladder. In women, they also support the uterus. PFD may be caused by an injury to the pelvic area or by a weakening of pelvic muscles. PFD causes  pelvic floor muscles to be too weak, too tight, or a combination of both. Symptoms may vary from person to person. In most cases, PFD can be treated with physical therapies and medicines. Surgery may be an option if other treatments do not help. This information is not intended to replace advice given to you by your health care provider. Make sure you discuss any questions you have with your health care provider. Document Revised: 09/27/2020 Document Reviewed: 09/27/2020 Elsevier Patient Education  2022 [employer].

## 2024-06-06 ENCOUNTER — Other Ambulatory Visit: Payer: Self-pay | Admitting: Internal Medicine

## 2024-06-06 DIAGNOSIS — R109 Unspecified abdominal pain: Secondary | ICD-10-CM

## 2024-06-08 NOTE — Telephone Encounter (Signed)
 Please review medication refill request

## 2024-06-08 NOTE — Telephone Encounter (Signed)
 Requested medication (s) are due for refill today - no  Requested medication (s) are on the active medication list -yes  Future visit scheduled -yes  Last refill: 05/19/24 #30  Notes to clinic: non delegated Rx  Requested Prescriptions  Pending Prescriptions Disp Refills   cyclobenzaprine  (FLEXERIL ) 10 MG tablet [Pharmacy Med Name: CYCLOBENZAPRINE  10 MG TABLET] 21 tablet 1    Sig: TAKE 1 TABLET BY MOUTH EVERYDAY AT BEDTIME     Not Delegated - Analgesics:  Muscle Relaxants Failed - 06/08/2024 10:33 AM      Failed - This refill cannot be delegated      Failed - Valid encounter within last 6 months    Recent Outpatient Visits           2 weeks ago Pelvic pain   [place] [facility], Leita DEL, MD   6 months ago Gastroesophageal reflux disease without esophagitis   [place] [facility], Leita DEL, MD   8 months ago Annual physical exam   [facility], Leita DEL, MD                 Requested Prescriptions  Pending Prescriptions Disp Refills   cyclobenzaprine  (FLEXERIL ) 10 MG tablet [Pharmacy Med Name: CYCLOBENZAPRINE  10 MG TABLET] 21 tablet 1    Sig: TAKE 1 TABLET BY MOUTH EVERYDAY AT BEDTIME     Not Delegated - Analgesics:  Muscle Relaxants Failed - 06/08/2024 10:33 AM      Failed - This refill cannot be delegated      Failed - Valid encounter within last 6 months    Recent Outpatient Visits           2 weeks ago Pelvic pain   [place] [facility], Leita DEL, MD   6 months ago Gastroesophageal reflux disease without esophagitis   [place] [facility], Leita DEL, MD   8 months ago Annual physical exam   [facility] at [facility], Leita DEL, MD

## 2024-06-14 ENCOUNTER — Encounter: Payer: Self-pay | Admitting: *Deleted

## 2024-09-28 ENCOUNTER — Encounter: Admitting: Student
